# Patient Record
Sex: Male | Born: 1940
Health system: Southern US, Community
[De-identification: ages and names within clinical notes are randomized; demographics above are authoritative.]

## PROBLEM LIST (undated history)

## (undated) DIAGNOSIS — I878 Other specified disorders of veins: Secondary | ICD-10-CM

## (undated) DIAGNOSIS — H3530 Unspecified macular degeneration: Secondary | ICD-10-CM

## (undated) DIAGNOSIS — Z803 Family history of malignant neoplasm of breast: Secondary | ICD-10-CM

## (undated) DIAGNOSIS — F419 Anxiety disorder, unspecified: Secondary | ICD-10-CM

## (undated) DIAGNOSIS — G4730 Sleep apnea, unspecified: Secondary | ICD-10-CM

## (undated) DIAGNOSIS — E785 Hyperlipidemia, unspecified: Secondary | ICD-10-CM

## (undated) DIAGNOSIS — M5136 Other intervertebral disc degeneration, lumbar region: Secondary | ICD-10-CM

## (undated) DIAGNOSIS — M51369 Other intervertebral disc degeneration, lumbar region without mention of lumbar back pain or lower extremity pain: Secondary | ICD-10-CM

## (undated) DIAGNOSIS — G629 Polyneuropathy, unspecified: Secondary | ICD-10-CM

## (undated) DIAGNOSIS — F17200 Nicotine dependence, unspecified, uncomplicated: Secondary | ICD-10-CM

## (undated) HISTORY — DX: Polyneuropathy, unspecified: G62.9

## (undated) HISTORY — DX: Other intervertebral disc degeneration, lumbar region without mention of lumbar back pain or lower extremity pain: M51.369

## (undated) HISTORY — DX: Other specified disorders of veins: I87.8

## (undated) HISTORY — DX: Unspecified macular degeneration: H35.30

## (undated) HISTORY — DX: Other intervertebral disc degeneration, lumbar region: M51.36

## (undated) HISTORY — DX: Sleep apnea, unspecified: G47.30

## (undated) HISTORY — DX: Family history of malignant neoplasm of breast: Z80.3

## (undated) HISTORY — DX: Nicotine dependence, unspecified, uncomplicated: F17.200

## (undated) HISTORY — DX: Anxiety disorder, unspecified: F41.9

---

## 2003-08-08 ENCOUNTER — Encounter: Admission: RE | Admit: 2003-08-08 | Discharge: 2003-08-08 | Payer: Self-pay | Admitting: Family Medicine

## 2003-11-17 ENCOUNTER — Ambulatory Visit (HOSPITAL_COMMUNITY): Admission: RE | Admit: 2003-11-17 | Discharge: 2003-11-17 | Payer: Self-pay | Admitting: Gastroenterology

## 2003-11-17 ENCOUNTER — Encounter (INDEPENDENT_AMBULATORY_CARE_PROVIDER_SITE_OTHER): Payer: Self-pay | Admitting: *Deleted

## 2005-06-25 ENCOUNTER — Observation Stay (HOSPITAL_COMMUNITY): Admission: EM | Admit: 2005-06-25 | Discharge: 2005-06-26 | Payer: Self-pay | Admitting: Emergency Medicine

## 2006-06-02 ENCOUNTER — Ambulatory Visit (HOSPITAL_COMMUNITY): Admission: RE | Admit: 2006-06-02 | Discharge: 2006-06-02 | Payer: Self-pay | Admitting: Orthopedic Surgery

## 2007-09-08 ENCOUNTER — Emergency Department (HOSPITAL_COMMUNITY): Admission: EM | Admit: 2007-09-08 | Discharge: 2007-09-08 | Payer: Self-pay | Admitting: Emergency Medicine

## 2010-11-01 NOTE — Op Note (Signed)
NAME:  CHRISTIFER, CHAPDELAINE                         ACCOUNT NO.:  [id number]   MEDICAL RECORD NO.:  [id number]                   PATIENT TYPE:  AMB   LOCATION:  ENDO                                 FACILITY:  [facility]   PHYSICIAN:  Anselmo Rod, M.D.               DATE OF BIRTH:  12-22-1940   DATE OF PROCEDURE:  11/17/2003  DATE OF DISCHARGE:                                 OPERATIVE REPORT   PROCEDURE PERFORMED:  Colonoscopy with snare polypectomy x1.   ENDOSCOPIST:  Anselmo Rod, M.D.   INSTRUMENT USED:  Olympus video colonoscope.   INDICATION FOR PROCEDURE:  Guaiac-positive stools and chronic constipation  in a 70 year old white male.  Rule out colonic polyps, masses, etc.   PREPROCEDURE PREPARATION:  Informed consent was procured from the patient.  The patient had fasted for eight hours prior to the procedure and prepped  with a bottle of magnesium citrate and a gallon of GoLYTELY the night prior  to the procedure.   PREPROCEDURE PHYSICAL:  VITAL SIGNS:  The patient had stable vital signs.  NECK:  Supple.  CHEST:  Clear to auscultation.  S1, S2 regular.  ABDOMEN:  Soft with normal bowel sounds.   DESCRIPTION OF PROCEDURE:  The patient was placed in the left lateral  decubitus position and sedated with Demerol and Versed in slow incremental  doses.  Once the patient was adequately sedate and maintained on low-flow  oxygen and continuous cardiac monitoring, the Olympus video colonoscope was  advanced from the rectum to the cecum.  A flat polyp was snared from the  transverse colon close to the hepatic flexure.  Small internal and external  hemorrhoids were appreciated on retroflexion and anal inspection.  No other  abnormalities were noted.  There was no evidence of diverticulosis.  The  patient tolerated the procedure well without immediate complications.   IMPRESSION:  1. Small internal and external hemorrhoids.  2. A flat polyp snared from the transverse colon close to  hepatic flexure.  3. No evidence of diverticulosis.   RECOMMENDATIONS:  1. Await pathology results.  2. Avoid all nonsteroidals including aspirin for the next four weeks.  3. Repeat guaiac testing on an outpatient basis.  Further recommendations     will be made in follow-up.  4. Outpatient follow-up is advised in the next two weeks or earlier if need     be.                                               Anselmo Rod, M.D.    JNM/MEDQ  D:  11/17/2003  T:  11/18/2003  Job:  [id number]   cc:   Marjory Lies, M.D.  [address]  [place]  [place] [address]  Fax: [phone]

## 2010-11-01 NOTE — Discharge Summary (Signed)
NAME:  ROI, JAFARI NO.:  [id number]   MEDICAL RECORD NO.:  [id number]          PATIENT TYPE:  INP   LOCATION:  5733                         FACILITY:  [facility]   PHYSICIAN:  Michaelyn Barter, M.D. DATE OF BIRTH:  1941-03-24   DATE OF ADMISSION:  06/25/2005  DATE OF DISCHARGE:  06/26/2005                                 DISCHARGE SUMMARY   PRIMARY CARE PHYSICIAN:  Dr. Doristine Counter of [facility].   FINAL DIAGNOSIS:  Left lower extremity deep venous thrombosis.   HISTORY OF PRESENT ILLNESS:  Mr. Mortellaro is a 70 year old gentleman who  complained of sudden onset of swelling within his left lower extremity.  He  went to see his primary care physician for assessment.  He denied  experiencing any similar episodes of swelling previously.   PAST MEDICAL HISTORY:  For past medical history, please see the dictation  dictated by Dr. Lavera Guise on June 25, 2005.   HOSPITAL COURSE:  PROBLEM #1 - LEFT LOWER EXTREMITY DEEP VENOUS THROMBOSIS:  Following the patient's presentation to the emergency department, he was  started on Lovenox 90 mg subcu q.12 h.  In addition, Coumadin was initiated;  he initially received 5 mg p.o.  The patient has not complained of any pain  or other symptoms related to his lower extremity throughout  his  hospitalization.  There has been no complaint of shortness of breath or  chest pain.  As of today, June 26, 2005, the patient's INR is 1.1.  His  PT is 14.5.  Today, the patient states that he feels pretty good and he  requests to be discharged from the hospital.  On his vitals today, his temperature is 98.6, heart rate 85, respirations  20, blood pressure 107/68 and he is saturating 94% on room air.  The  decision has been made to discharge the patient from the hospital today.   DISCHARGE MEDICATIONS:  The patient's medications will consist of the  following:  1.  Coumadin -- the patient will be instructed to take 7.5 mg tonight  and      then 5 mg afterwards.  2.  Lovenox 90 mg subcu q.12 h.   FOLLOWUP:  The patient will be instructed to follow up with Dr. Doristine Counter  tomorrow for evaluation of his coagulations.      Michaelyn Barter, M.D.  Electronically Signed     OR/MEDQ  D:  06/26/2005  T:  06/27/2005  Job:  [id number]   cc:   Marjory Lies, M.D.  Fax: [phone]

## 2010-11-01 NOTE — H&P (Signed)
NAME:  Jonathon [name], Jon[facility] [name] NO.:  [id number]   MEDICAL RECORD NO.:  [id number]          [name] TYPE:  INP   LOCATION:  1845                         FACILITY:  [facility]   PHYSICIAN:  Lonia Blood, M.D.       DATE OF BIRTH:  [birth year]   DATE OF ADMISSION:  06/25/2005  DATE OF DISCHARGE:                                HISTORY & PHYSICAL   PRIMARY CARE PHYSICIAN:  Dr. Doristine Counter.   CHIEF COMPLAINT:  Left leg swelling.   HISTORY OF PRESENT ILLNESS:  Jonathon [name] is a 70 year old man with history  of tobacco abuse, previously healthy, who experienced sudden onset of  swelling left leg today. He presented to his primary care physician and was  sent for lower extremity Doppler. A DVt was diagnosed and [name] was  sent to [facility]. He reports that three days prior to admission he had a long  drive from [place] to [place] without interruption . [facility]athon [name]  never had any previous similar episodes. He has never had any surgeries nor  recent traumatic events.   MEDICATIONS:  Xanax p.r.n.   FAMILY HISTORY:  Mother had varicose veins .   SOCIAL HISTORY:  [facility] [name] smokes one pack of cigarettes per day. Drinks  occasionally.  Works as a Nutritional therapist .  He is married and lives with his wife   ALLERGIES:  Penicillin and codeine.   REVIEW OF SYSTEMS:  Negative for chest pain, coughing, nausea. Negative for  hematochezia. Negative for dyspnea. All other system as per HPI.   PHYSICAL EXAMINATION:  Upon admission showed temperature 97.8, pulse 67,  respirations 16, blood pressure 127/83 .  Heart rate 86.  Saturation 94% on  room air.  GENERAL APPEARANCE:  [facility] [name] is a well-developed, well-nourished in no  acute distress. Alert and oriented to place, person and time.  CHEST:  Clear to auscultation bilaterally. No wheezes and no crackles.  NECK:  Supple. No JVD or carotid bruits.  HEART:  Regular rate and rhythm.  ABDOMEN:  Soft, nontender, and nondistended. Bowel sounds are  present.  EXTREMITIES:  [facility] left lower extremity has +2 edema, nonpitting.  SKIN:  Warm and dry.  NEUROLOGICAL: CN 3-12 inatct, strength 5/5 all 4 extremitties, sensation is  intact.   Sodium 139, potassium 3.9, chloride 104, ECG - NSR , incomplete RBBB.   IMPRESSION:  1.  Left lower extremity deep vein thrombosis most likely secondary to      prolonged immobilization . I do not suspect a malignancy. Jon[facility] [name]      had a colonoscopy in 2005 which was within normal limits. For      completeness will check a chest x-ray and a urinalysis, since Jonathon      [name] is a tobacco user and has family      history of genito-urinary malignancy.  2.  Tobacco abuse:  We will educate Jonathon [name] on smoking cessation. Start      a nicotine patch.  3.  Gastrointestinal prophylaxis using Protonix.      Lonia Blood, M.D.  Electronically Signed  SL/MEDQ  D:  06/25/2005  T:  06/26/2005  Job:  [id number]

## 2012-09-29 ENCOUNTER — Other Ambulatory Visit (HOSPITAL_COMMUNITY): Payer: Self-pay | Admitting: Family Medicine

## 2012-09-29 ENCOUNTER — Ambulatory Visit (HOSPITAL_COMMUNITY)
Admission: RE | Admit: 2012-09-29 | Discharge: 2012-09-29 | Disposition: A | Discharge status: Home / Self Care | Payer: Medicare Other | Source: Ambulatory Visit | Attending: Family Medicine | Admitting: Family Medicine

## 2012-09-29 DIAGNOSIS — M79609 Pain in unspecified limb: Secondary | ICD-10-CM

## 2012-09-29 NOTE — Progress Notes (Signed)
Arterial Duplex Right Lower Ext. Completed. Preliminary report by tech.  Normal ABI and Duplex Study. Chauncy Lean

## 2015-06-20 DIAGNOSIS — L97222 Non-pressure chronic ulcer of left calf with fat layer exposed: Secondary | ICD-10-CM | POA: Diagnosis not present

## 2015-06-27 DIAGNOSIS — I872 Venous insufficiency (chronic) (peripheral): Secondary | ICD-10-CM | POA: Diagnosis not present

## 2015-06-27 DIAGNOSIS — L97222 Non-pressure chronic ulcer of left calf with fat layer exposed: Secondary | ICD-10-CM | POA: Diagnosis not present

## 2015-07-04 DIAGNOSIS — Z87891 Personal history of nicotine dependence: Secondary | ICD-10-CM | POA: Diagnosis not present

## 2015-07-04 DIAGNOSIS — I872 Venous insufficiency (chronic) (peripheral): Secondary | ICD-10-CM | POA: Diagnosis not present

## 2015-07-04 DIAGNOSIS — L97222 Non-pressure chronic ulcer of left calf with fat layer exposed: Secondary | ICD-10-CM | POA: Diagnosis not present

## 2015-07-11 DIAGNOSIS — Z87891 Personal history of nicotine dependence: Secondary | ICD-10-CM | POA: Diagnosis not present

## 2015-07-11 DIAGNOSIS — L97222 Non-pressure chronic ulcer of left calf with fat layer exposed: Secondary | ICD-10-CM | POA: Diagnosis not present

## 2015-07-11 DIAGNOSIS — I872 Venous insufficiency (chronic) (peripheral): Secondary | ICD-10-CM | POA: Diagnosis not present

## 2015-07-16 DIAGNOSIS — L98491 Non-pressure chronic ulcer of skin of other sites limited to breakdown of skin: Secondary | ICD-10-CM | POA: Diagnosis not present

## 2015-07-16 DIAGNOSIS — G629 Polyneuropathy, unspecified: Secondary | ICD-10-CM | POA: Diagnosis not present

## 2015-07-16 DIAGNOSIS — F419 Anxiety disorder, unspecified: Secondary | ICD-10-CM | POA: Diagnosis not present

## 2015-07-18 DIAGNOSIS — L97222 Non-pressure chronic ulcer of left calf with fat layer exposed: Secondary | ICD-10-CM | POA: Diagnosis not present

## 2015-07-18 DIAGNOSIS — Z87891 Personal history of nicotine dependence: Secondary | ICD-10-CM | POA: Diagnosis not present

## 2015-07-18 DIAGNOSIS — I872 Venous insufficiency (chronic) (peripheral): Secondary | ICD-10-CM | POA: Diagnosis not present

## 2015-07-23 DIAGNOSIS — H35031 Hypertensive retinopathy, right eye: Secondary | ICD-10-CM | POA: Diagnosis not present

## 2015-07-23 DIAGNOSIS — H35373 Puckering of macula, bilateral: Secondary | ICD-10-CM | POA: Diagnosis not present

## 2015-07-23 DIAGNOSIS — H2513 Age-related nuclear cataract, bilateral: Secondary | ICD-10-CM | POA: Diagnosis not present

## 2015-07-23 DIAGNOSIS — H35033 Hypertensive retinopathy, bilateral: Secondary | ICD-10-CM | POA: Diagnosis not present

## 2015-07-23 DIAGNOSIS — H35032 Hypertensive retinopathy, left eye: Secondary | ICD-10-CM | POA: Diagnosis not present

## 2015-07-23 DIAGNOSIS — H25013 Cortical age-related cataract, bilateral: Secondary | ICD-10-CM | POA: Diagnosis not present

## 2015-07-25 DIAGNOSIS — L97222 Non-pressure chronic ulcer of left calf with fat layer exposed: Secondary | ICD-10-CM | POA: Diagnosis not present

## 2015-07-25 DIAGNOSIS — I872 Venous insufficiency (chronic) (peripheral): Secondary | ICD-10-CM | POA: Diagnosis not present

## 2015-07-25 DIAGNOSIS — Z87891 Personal history of nicotine dependence: Secondary | ICD-10-CM | POA: Diagnosis not present

## 2015-08-01 DIAGNOSIS — L97222 Non-pressure chronic ulcer of left calf with fat layer exposed: Secondary | ICD-10-CM | POA: Diagnosis not present

## 2015-08-01 DIAGNOSIS — Z87891 Personal history of nicotine dependence: Secondary | ICD-10-CM | POA: Diagnosis not present

## 2015-08-01 DIAGNOSIS — I872 Venous insufficiency (chronic) (peripheral): Secondary | ICD-10-CM | POA: Diagnosis not present

## 2015-11-07 DIAGNOSIS — M545 Low back pain: Secondary | ICD-10-CM | POA: Diagnosis not present

## 2015-12-20 DIAGNOSIS — I83009 Varicose veins of unspecified lower extremity with ulcer of unspecified site: Secondary | ICD-10-CM | POA: Diagnosis not present

## 2015-12-20 DIAGNOSIS — G609 Hereditary and idiopathic neuropathy, unspecified: Secondary | ICD-10-CM | POA: Diagnosis not present

## 2015-12-20 DIAGNOSIS — L97909 Non-pressure chronic ulcer of unspecified part of unspecified lower leg with unspecified severity: Secondary | ICD-10-CM | POA: Diagnosis not present

## 2016-01-14 DIAGNOSIS — G609 Hereditary and idiopathic neuropathy, unspecified: Secondary | ICD-10-CM | POA: Diagnosis not present

## 2016-01-14 DIAGNOSIS — M150 Primary generalized (osteo)arthritis: Secondary | ICD-10-CM | POA: Diagnosis not present

## 2016-01-14 DIAGNOSIS — F419 Anxiety disorder, unspecified: Secondary | ICD-10-CM | POA: Diagnosis not present

## 2016-02-25 ENCOUNTER — Telehealth: Payer: Self-pay | Admitting: Family Medicine

## 2016-02-25 NOTE — Telephone Encounter (Signed)
This patient wants to establish care with Dr. Caryl NeverBurchette. He said that he seen him at the [facility]. I explained to him that Dr. Caryl NeverBurchette isn't taking new patients but I can stil send a message and one of the schedulers will call him to make an appointment to see Dr. Caryl NeverBurchette if he accepts him back or to scehdule him with another provider. He said that he only wants to be established with Dr. Caryl NeverBurchette.

## 2016-02-25 NOTE — Telephone Encounter (Signed)
Unable to take anymore New Patients. Please have him schedule with someone else. Thanks.

## 2016-02-25 NOTE — Telephone Encounter (Signed)
Unfortunately, I cannot take on any new patients at this time.

## 2016-03-18 DIAGNOSIS — Z23 Encounter for immunization: Secondary | ICD-10-CM | POA: Diagnosis not present

## 2016-04-16 NOTE — Telephone Encounter (Signed)
Pt has scheduled appt with Dr Durene CalHunter

## 2016-05-28 ENCOUNTER — Ambulatory Visit (HOSPITAL_COMMUNITY): Payer: Medicare Other

## 2016-05-28 ENCOUNTER — Ambulatory Visit (HOSPITAL_COMMUNITY)
Admission: EM | Admit: 2016-05-28 | Discharge: 2016-05-28 | Disposition: A | Discharge status: Home / Self Care | Payer: Medicare Other | Attending: Emergency Medicine | Admitting: Emergency Medicine

## 2016-05-28 ENCOUNTER — Emergency Department (HOSPITAL_COMMUNITY): Payer: Medicare Other | Admitting: Certified Registered"

## 2016-05-28 ENCOUNTER — Encounter (HOSPITAL_COMMUNITY): Payer: Self-pay

## 2016-05-28 ENCOUNTER — Encounter (HOSPITAL_COMMUNITY)
Admission: EM | Disposition: A | Discharge status: Home / Self Care | Payer: Self-pay | Source: Home / Self Care | Attending: Emergency Medicine

## 2016-05-28 DIAGNOSIS — X58XXXA Exposure to other specified factors, initial encounter: Secondary | ICD-10-CM | POA: Insufficient documentation

## 2016-05-28 DIAGNOSIS — K222 Esophageal obstruction: Secondary | ICD-10-CM

## 2016-05-28 DIAGNOSIS — R918 Other nonspecific abnormal finding of lung field: Secondary | ICD-10-CM | POA: Diagnosis not present

## 2016-05-28 DIAGNOSIS — Z79899 Other long term (current) drug therapy: Secondary | ICD-10-CM | POA: Insufficient documentation

## 2016-05-28 DIAGNOSIS — T18128A Food in esophagus causing other injury, initial encounter: Secondary | ICD-10-CM | POA: Insufficient documentation

## 2016-05-28 DIAGNOSIS — F17210 Nicotine dependence, cigarettes, uncomplicated: Secondary | ICD-10-CM | POA: Insufficient documentation

## 2016-05-28 DIAGNOSIS — Z791 Long term (current) use of non-steroidal anti-inflammatories (NSAID): Secondary | ICD-10-CM | POA: Diagnosis not present

## 2016-05-28 DIAGNOSIS — N289 Disorder of kidney and ureter, unspecified: Secondary | ICD-10-CM | POA: Diagnosis not present

## 2016-05-28 DIAGNOSIS — K210 Gastro-esophageal reflux disease with esophagitis: Secondary | ICD-10-CM | POA: Diagnosis not present

## 2016-05-28 DIAGNOSIS — K219 Gastro-esophageal reflux disease without esophagitis: Secondary | ICD-10-CM | POA: Diagnosis not present

## 2016-05-28 DIAGNOSIS — R1310 Dysphagia, unspecified: Secondary | ICD-10-CM | POA: Diagnosis not present

## 2016-05-28 HISTORY — PX: ESOPHAGOGASTRODUODENOSCOPY: SHX5428

## 2016-05-28 LAB — CBC WITH DIFFERENTIAL/PLATELET
Basophils Absolute: 0 10*3/uL (ref 0.0–0.1)
Basophils Relative: 1 %
EOS ABS: 0.4 10*3/uL (ref 0.0–0.7)
EOS PCT: 8 %
HCT: 42.2 % (ref 39.0–52.0)
HEMOGLOBIN: 15 g/dL (ref 13.0–17.0)
LYMPHS ABS: 1.2 10*3/uL (ref 0.7–4.0)
LYMPHS PCT: 26 %
MCH: 32.5 pg (ref 26.0–34.0)
MCHC: 35.5 g/dL (ref 30.0–36.0)
MCV: 91.3 fL (ref 78.0–100.0)
MONOS PCT: 11 %
Monocytes Absolute: 0.5 10*3/uL (ref 0.1–1.0)
NEUTROS PCT: 54 %
Neutro Abs: 2.5 10*3/uL (ref 1.7–7.7)
Platelets: 242 10*3/uL (ref 150–400)
RBC: 4.62 MIL/uL (ref 4.22–5.81)
RDW: 13 % (ref 11.5–15.5)
WBC: 4.6 10*3/uL (ref 4.0–10.5)

## 2016-05-28 LAB — I-STAT CHEM 8, ED
BUN: 15 mg/dL (ref 6–20)
CALCIUM ION: 1.2 mmol/L (ref 1.15–1.40)
CHLORIDE: 102 mmol/L (ref 101–111)
Creatinine, Ser: 1.4 mg/dL — ABNORMAL HIGH (ref 0.61–1.24)
GLUCOSE: 80 mg/dL (ref 65–99)
HCT: 44 % (ref 39.0–52.0)
Hemoglobin: 15 g/dL (ref 13.0–17.0)
Potassium: 4 mmol/L (ref 3.5–5.1)
Sodium: 140 mmol/L (ref 135–145)
TCO2: 29 mmol/L (ref 0–100)

## 2016-05-28 SURGERY — EGD (ESOPHAGOGASTRODUODENOSCOPY)
Anesthesia: Moderate Sedation | Laterality: Left

## 2016-05-28 SURGERY — EGD (ESOPHAGOGASTRODUODENOSCOPY)
Anesthesia: General

## 2016-05-28 MED ORDER — SUCCINYLCHOLINE CHLORIDE 200 MG/10ML IV SOSY
PREFILLED_SYRINGE | INTRAVENOUS | Status: DC | PRN
  Administered 2016-05-28: 120 mg via INTRAVENOUS

## 2016-05-28 MED ORDER — ONDANSETRON HCL 4 MG/2ML IJ SOLN
INTRAMUSCULAR | Status: DC | PRN
  Administered 2016-05-28: 4 mg via INTRAVENOUS

## 2016-05-28 MED ORDER — DIPHENHYDRAMINE HCL 50 MG/ML IJ SOLN
INTRAMUSCULAR | Status: AC
  Filled 2016-05-28: qty 1

## 2016-05-28 MED ORDER — MIDAZOLAM HCL 10 MG/2ML IJ SOLN
INTRAMUSCULAR | Status: DC | PRN
  Administered 2016-05-28 (×2): 2 mg via INTRAVENOUS

## 2016-05-28 MED ORDER — DIPHENHYDRAMINE HCL 50 MG/ML IJ SOLN
INTRAMUSCULAR | Status: DC | PRN
  Administered 2016-05-28: 25 mg via INTRAVENOUS

## 2016-05-28 MED ORDER — FENTANYL CITRATE (PF) 100 MCG/2ML IJ SOLN
INTRAMUSCULAR | Status: DC | PRN
  Administered 2016-05-28 (×3): 25 ug via INTRAVENOUS

## 2016-05-28 MED ORDER — FENTANYL CITRATE (PF) 100 MCG/2ML IJ SOLN
INTRAMUSCULAR | Status: AC
  Filled 2016-05-28: qty 2

## 2016-05-28 MED ORDER — GLUCAGON HCL RDNA (DIAGNOSTIC) 1 MG IJ SOLR
1.0000 mg | Freq: Once | INTRAMUSCULAR | Status: AC
  Administered 2016-05-28: 1 mg via INTRAVENOUS
  Filled 2016-05-28: qty 1

## 2016-05-28 MED ORDER — PROPOFOL 10 MG/ML IV BOLUS
INTRAVENOUS | Status: DC | PRN
  Administered 2016-05-28: 150 mg via INTRAVENOUS

## 2016-05-28 MED ORDER — SUCCINYLCHOLINE CHLORIDE 200 MG/10ML IV SOSY
PREFILLED_SYRINGE | INTRAVENOUS | Status: AC
  Filled 2016-05-28: qty 10

## 2016-05-28 MED ORDER — FENTANYL CITRATE (PF) 100 MCG/2ML IJ SOLN
INTRAMUSCULAR | Status: DC | PRN
  Administered 2016-05-28 (×2): 50 ug via INTRAVENOUS

## 2016-05-28 MED ORDER — SODIUM CHLORIDE 0.9 % IV SOLN
INTRAVENOUS | Status: DC
  Administered 2016-05-28: 500 mL via INTRAVENOUS

## 2016-05-28 MED ORDER — FENTANYL CITRATE (PF) 100 MCG/2ML IJ SOLN
INTRAMUSCULAR | Status: AC
  Filled 2016-05-28: qty 4

## 2016-05-28 MED ORDER — LACTATED RINGERS IV SOLN
INTRAVENOUS | Status: DC | PRN
Start: 2016-05-28 — End: 2016-05-28
  Administered 2016-05-28: 20:00:00 via INTRAVENOUS

## 2016-05-28 MED ORDER — PROPOFOL 10 MG/ML IV BOLUS
INTRAVENOUS | Status: AC
  Filled 2016-05-28: qty 20

## 2016-05-28 MED ORDER — LIDOCAINE 2% (20 MG/ML) 5 ML SYRINGE
INTRAMUSCULAR | Status: DC | PRN
  Administered 2016-05-28: 20 mg via INTRAVENOUS

## 2016-05-28 MED ORDER — ONDANSETRON HCL 4 MG/2ML IJ SOLN
INTRAMUSCULAR | Status: AC
Start: 2016-05-28 — End: 2016-05-28
  Filled 2016-05-28: qty 2

## 2016-05-28 MED ORDER — MIDAZOLAM HCL 5 MG/ML IJ SOLN
INTRAMUSCULAR | Status: AC
  Filled 2016-05-28: qty 2

## 2016-05-28 NOTE — Consult Note (Signed)
Reason for Consult: Food impaction. Referring Physician: ER MD-Dr. Ethelda ChickJacubowitz.  [facility] [name] is an 75 y.o. male.  HPI: 75 year old white male here fo a food impaction that occurred about 2 PM this afternoon while he was eating some bread and country barbeque. He has been taking 2 BC powders and Meloxicam for joint pains. He is otherwise healthy. He claims he has been having dysphagia for solids off an on for 15 years but has never called my office with this problem.  History reviewed. No pertinent past medical history.  History reviewed. No pertinent surgical history.  History reviewed. No pertinent family history.  Social History:  reports that he has been smoking Cigarettes.  He has never used smokeless tobacco. He reports that he drinks alcohol. He reports that he does not use drugs.  Allergies:  Allergies  Allergen Reactions  . Penicillins Rash    Has patient had a PCN reaction causing immediate rash, facial/tongue/throat swelling, SOB or lightheadedness with hypotension: no Has patient had a PCN reaction causing severe rash involving mucus membranes or skin necrosis:yes Has patient had a PCN reaction that required hospitalization: no Has patient had a PCN reaction occurring within the last 10 years: no If all of the above answers are "NO", then may proceed with Cephalosporin use.    Medications: I have reviewed the patient's current medications.  Results for orders placed or performed during the hospital encounter of 05/28/16 (from the past 48 hour(s))  CBC with Differential/Platelet     Status: None   Collection Time: 05/28/16  4:46 PM  Result Value Ref Range   WBC 4.6 4.0 - 10.5 K/uL   RBC 4.62 4.22 - 5.81 MIL/uL   Hemoglobin 15.0 13.0 - 17.0 g/dL   HCT 16.142.2 09.639.0 - 04.552.0 %   MCV 91.3 78.0 - 100.0 fL   MCH 32.5 26.0 - 34.0 pg   MCHC 35.5 30.0 - 36.0 g/dL   RDW 40.913.0 81.111.5 - 91.415.5 %   Platelets 242 150 - 400 K/uL   Neutrophils Relative % 54 %   Neutro Abs 2.5 1.7 - 7.7  K/uL   Lymphocytes Relative 26 %   Lymphs Abs 1.2 0.7 - 4.0 K/uL   Monocytes Relative 11 %   Monocytes Absolute 0.5 0.1 - 1.0 K/uL   Eosinophils Relative 8 %   Eosinophils Absolute 0.4 0.0 - 0.7 K/uL   Basophils Relative 1 %   Basophils Absolute 0.0 0.0 - 0.1 K/uL  I-stat chem 8, ed     Status: Abnormal   Collection Time: 05/28/16  4:57 PM  Result Value Ref Range   Sodium 140 135 - 145 mmol/L   Potassium 4.0 3.5 - 5.1 mmol/L   Chloride 102 101 - 111 mmol/L   BUN 15 6 - 20 mg/dL   Creatinine, Ser 7.821.40 (H) 0.61 - 1.24 mg/dL   Glucose, Bld 80 65 - 99 mg/dL   Calcium, Ion 9.561.20 1.15 - 1.40 mmol/L   TCO2 29 0 - 100 mmol/L   Hemoglobin 15.0 13.0 - 17.0 g/dL   HCT 21.344.0 08.639.0 - 57.852.0 %    No results found.  Review of Systems  Constitutional: Negative.   HENT: Negative.   Eyes: Negative.   Respiratory: Negative.   Cardiovascular: Negative.   Gastrointestinal: Positive for heartburn. Negative for abdominal pain, blood in stool, constipation, diarrhea, melena, nausea and vomiting.  Musculoskeletal: Positive for joint pain.  Skin: Negative.   Neurological: Negative.   Endo/Heme/Allergies: Negative.  Psychiatric/Behavioral: Negative.    Blood pressure 144/95, pulse 70, temperature 97.7 F (36.5 C), temperature source Oral, resp. rate 16, height 5\' 10"  (1.778 m), weight 94.8 kg (209 lb), SpO2 96 %. Physical Exam  Constitutional: He is oriented to person, place, and time. He appears well-developed and well-nourished.  HENT:  Head: Normocephalic and atraumatic.  Eyes: Conjunctivae and EOM are normal. Pupils are equal, round, and reactive to light.  Neck: Normal range of motion. Neck supple.  Cardiovascular: Normal rate and regular rhythm.   GI: Soft. Bowel sounds are normal.  Musculoskeletal: Normal range of motion.  Neurological: He is alert and oriented to person, place, and time.  Skin: Skin is warm and dry.  Psychiatric: He has a normal mood and affect. His behavior is normal.  Judgment and thought content normal.   Assessment/Plan: Solid food dysphagia with a food impaction-will proceed with an EGD now.  Jonathon [name] 05/28/2016, 6:51 PM

## 2016-05-28 NOTE — ED Notes (Signed)
Endoscopy at bedside. 

## 2016-05-28 NOTE — Op Note (Addendum)
[facility] Patient Name: Jonathon [name] Procedure Date: 05/28/2016 MRN: [id number] Attending MD: Charna Elizabeth , MD Date of Birth: November 29, 1940 CSN: [id number] Age: 75 Admit Type: Inpatient Procedure:                EGD with removal of meat impaction from proximal                            esophagus. Indications:              Dysphagia, Food-meat impaction; Gastro-esophageal                            reflux disease Providers:                Charna Elizabeth, MD, Dwain Sarna, RN, Beryle Beams,                            technician, Early Osmond, CRNA Referring MD:             Delorse Lek, MD Medicines:                Monitored anesthesia care was used after the                            patient was transferred to the OR. Complications:            No immediate complications. Estimated Blood Loss:     Estimated blood loss: none. Procedure:                Pre-anesthesia assessment: - Prior to the                            procedure, a history and physical was performed,                            and patient medications and allergies were                            reviewed. The patient's tolerance of previous                            anesthesia was also reviewed. The risks and                            benefits of the procedure and the sedation options                            and risks were discussed with the patient. All                            questions were answered, and informed consent was                            obtained. Prior anticoagulants: The patient has  taken previous NSAID medication, last dose was day                            of procedure. ASA Grade assessment: II - A patient                            with mild systemic disease. After reviewing the                            risks and benefits, the patient was deemed in                            satisfactory condition to undergo the procedure.                             After obtaining informed consent, the endoscope was                            passed under direct vision. Throughout the                            procedure, the patient's blood pressure, pulse, and                            oxygen saturations were monitored continuously. The                            endoscope was introduced through the mouth, and                            advanced to the antrum of the stomach. The EGD was                            extremely difficult due to the meat bolus impacted                            in the proximal esophagus. The patient tolerated                            the procedure well. Scope In: Scope Out: Findings:      A large bolus of meat was noted impacted at the proximal esophagus-an       attempt was made to gently "push' it further down with the tip of the       scope but i was unable to do so. I aborted the procedure and moved the       patient to the OR and repeated the procedure under MAC after he was       intubated by CRNA. Removal of food was accomplished by using a Talon       grasper and breaking up the meat bolus slowly and moving it further down       into the esophagus. There was some esophagitis noted in the proximal       esophagus where the meat bolus had been  impacted. I was not able to       appreciate a Zenker's diverticulum there. The rest of the esophagus       appeared patent and normal.      The examined stomach appeared normal.      The proximal small bowel was noted visualized. Impression:               - Large meat bolus in the proximal esophagus-broken                            up piecemeal and removed by a Talon grasper-food                            gently pushed into the stomach.                           - Normal appearing stomach. Moderate Sedation:      Moderate (conscious) sedation was administered by the endoscopy nurse       and supervised by the endoscopist when the patient was in the ER. The        following parameters were monitored: oxygen saturation, heart rate,       blood pressure, respiratory rate, EKG, adequacy of pulmonary       ventilation, and response to care. Total physician intraservice time was       5 minutes. The intraprocedural time in the OR was 15 minutes. Recommendation:           - Clear liquid diet for now.                           - Continue present medications EXCEPT FOR MELOXICAM                            AND BC POWDERS.                           - No Aspirin, Ibuprofen, naproxen, or other                            non-steroidal anti-inflammatory drugs.                           - Return to my office tomorrow for an office visit. Procedure Code(s):        --- Professional ---                           [id number], Esophagogastroduodenoscopy, flexible,                            transoral; with removal of foreign body(s) Diagnosis Code(s):        --- Professional ---                           R13.10, Dysphagia, unspecified                           K21.9, Gastro-esophageal reflux disease without  esophagitis                           T18.128A, Food in esophagus causing other injury,                            initial encounter CPT copyright 2016 American Medical Association. All rights reserved. The codes documented in this report are preliminary and upon coder review may  be revised to meet current compliance requirements. Charna ElizabethJyothi Omolara Carol, MD Charna ElizabethJyothi Brendaly Townsel, MD 05/28/2016 9:00:47 PM This report has been signed electronically. Number of Addenda: 0

## 2016-05-28 NOTE — Anesthesia Preprocedure Evaluation (Signed)
Anesthesia Evaluation  Patient identified by MRN, date of birth, ID band Patient awake    Reviewed: Allergy & Precautions, NPO status , Patient's Chart, lab work & pertinent test results  Airway Mallampati: II  TM Distance: >3 FB Neck ROM: Full    Dental  (+) Dental Advisory Given   Pulmonary Current Smoker,    breath sounds clear to auscultation       Cardiovascular negative cardio ROS   Rhythm:Regular Rate:Normal     Neuro/Psych negative neurological ROS     GI/Hepatic Neg liver ROS, Food impaction   Endo/Other  negative endocrine ROS  Renal/GU negative Renal ROS     Musculoskeletal  (+) Arthritis ,   Abdominal   Peds  Hematology negative hematology ROS (+)   Anesthesia Other Findings   Reproductive/Obstetrics                             Anesthesia Physical Anesthesia Plan  ASA: II and emergent  Anesthesia Plan: General   Post-op Pain Management:    Induction: Intravenous, Rapid sequence and Cricoid pressure planned  Airway Management Planned: Oral ETT  Additional Equipment:   Intra-op Plan:   Post-operative Plan: Extubation in OR  Informed Consent: I have reviewed the patients History and Physical, chart, labs and discussed the procedure including the risks, benefits and alternatives for the proposed anesthesia with the patient or authorized representative who has indicated his/her understanding and acceptance.   Dental advisory given  Plan Discussed with: CRNA  Anesthesia Plan Comments:         Anesthesia Quick Evaluation

## 2016-05-28 NOTE — ED Triage Notes (Signed)
PT STS HE WAS EATING BARBEQUED PORK AND BREAD, AND HE IS UNABLE TO SWALLOW IT DOWN FOR THE PAST 30 MINUTES. PT STS HE CAN NOT SWALLOW. DENIES NAUSEA, VOMITING, OR SOB.

## 2016-05-28 NOTE — Anesthesia Postprocedure Evaluation (Signed)
Anesthesia Post Note  Patient: Jonathon RuedJack C [name]  Procedure(s) Performed: Procedure(s) (LRB): ESOPHAGOGASTRODUODENOSCOPY (EGD) WITH FOREIGN BODY REMOVAL (N/A)  Patient location during evaluation: PACU Anesthesia Type: General Level of consciousness: awake and alert Pain management: pain level controlled Vital Signs Assessment: post-procedure vital signs reviewed and stable Respiratory status: spontaneous breathing, nonlabored ventilation, respiratory function stable and patient connected to nasal cannula oxygen Cardiovascular status: blood pressure returned to baseline and stable Postop Assessment: no signs of nausea or vomiting Anesthetic complications: no    Last Vitals:  Vitals:   05/28/16 2039 05/28/16 2045  BP: 115/78 122/75  Pulse: 101 90  Resp: 22 14  Temp: 36.4 C     Last Pain:  Vitals:   05/28/16 2039  TempSrc:   PainSc: 0-No pain                 Kennieth RadFitzgerald, Deane Melick E

## 2016-05-28 NOTE — ED Provider Notes (Signed)
[facility]-EMERGENCY DEPT Provider Note   CSN: [id number] Arrival date & time: 05/28/16  1502     History   Chief Complaint Chief Complaint  Patient presents with  . Swallowed Foreign Body    HPI Maryan RuedJack C Denz is a 75 y.o. male.Patient was eating country barbecue 2 PM today when food got stuck while swallowing. He points to his mid chest. He has trouble swelling secretions since the event. He denies any dyspnea. No treatment prior to coming here he's had similar episodes in the past and food has passed spontaneously after waiting. Nothing makes symptoms better or worse no other associated symptoms  HPI  History reviewed. No pertinent past medical history. Past medical history arthritis There are no active problems to display for this patient.   History reviewed. No pertinent surgical history.     Home Medications    Prior to Admission medications   Medication Sig Start Date End Date Taking? Authorizing Provider  ALPRAZolam Prudy Feeler(XANAX) 0.5 MG tablet Take 0.5 mg by mouth 2 (two) times daily. 04/28/16  Yes Historical Provider, MD  Aspirin-Salicylamide-Caffeine (BC HEADACHE POWDER PO) Take 1 packet by mouth daily as needed (headache).   Yes Historical Provider, MD  gabapentin (NEURONTIN) 400 MG capsule Take 400 mg by mouth 3 (three) times daily. 05/09/16  Yes Historical Provider, MD  meloxicam (MOBIC) 15 MG tablet Take 15 mg by mouth daily. 04/27/16  Yes Historical Provider, MD  Omega-3 Fatty Acids (FISH OIL PO) Take 2 capsules by mouth 2 (two) times daily.   Yes Historical Provider, MD    Family History History reviewed. No pertinent family history.  Social History Social History  Substance Use Topics  . Smoking status: Current Every Day Smoker    Types: Cigarettes  . Smokeless tobacco: Never Used  . Alcohol use Yes     Comment: SOCIALLY     Allergies   Penicillins   Review of Systems Review of Systems  Constitutional: Negative.   HENT: Positive for trouble  swallowing.   Respiratory: Negative.   Cardiovascular: Negative.   Gastrointestinal: Negative.   Musculoskeletal: Negative.   Skin: Negative.   Neurological: Negative.   Psychiatric/Behavioral: Negative.   All other systems reviewed and are negative.    Physical Exam Updated Vital Signs BP 161/88   Pulse 79   Temp 97.7 F (36.5 C) (Oral)   Resp 16   Ht 5\' 10"  (1.778 m)   Wt 209 lb (94.8 kg)   SpO2 98%   BMI 29.99 kg/m   Physical Exam  Constitutional: He appears well-developed and well-nourished.  HENT:  Head: Normocephalic and atraumatic.  Eyes: Conjunctivae are normal. Pupils are equal, round, and reactive to light.  Neck: Neck supple. No tracheal deviation present. No thyromegaly present.  Cardiovascular: Normal rate and regular rhythm.   No murmur heard. Pulmonary/Chest: Effort normal and breath sounds normal.  Abdominal: Soft. Bowel sounds are normal. He exhibits no distension. There is no tenderness.  Musculoskeletal: Normal range of motion. He exhibits no edema or tenderness.  Neurological: He is alert. Coordination normal.  Skin: Skin is warm and dry. No rash noted.  Psychiatric: He has a normal mood and affect.  Nursing note and vitals reviewed.    ED Treatments / Results  Labs (all labs ordered are listed, but only abnormal results are displayed) Labs Reviewed  CBC WITH DIFFERENTIAL/PLATELET  I-STAT CHEM 8, ED    EKG  EKG Interpretation None       Radiology No results found.  Procedures Procedures (including critical care time)  Medications Ordered in ED Medications  glucagon (human recombinant) (GLUCAGEN) injection 1 mg (not administered)    Results for orders placed or performed during the hospital encounter of 05/28/16  CBC with Differential/Platelet  Result Value Ref Range   WBC 4.6 4.0 - 10.5 K/uL   RBC 4.62 4.22 - 5.81 MIL/uL   Hemoglobin 15.0 13.0 - 17.0 g/dL   HCT 16.142.2 09.639.0 - 04.552.0 %   MCV 91.3 78.0 - 100.0 fL   MCH 32.5  26.0 - 34.0 pg   MCHC 35.5 30.0 - 36.0 g/dL   RDW 40.913.0 81.111.5 - 91.415.5 %   Platelets 242 150 - 400 K/uL   Neutrophils Relative % 54 %   Neutro Abs 2.5 1.7 - 7.7 K/uL   Lymphocytes Relative 26 %   Lymphs Abs 1.2 0.7 - 4.0 K/uL   Monocytes Relative 11 %   Monocytes Absolute 0.5 0.1 - 1.0 K/uL   Eosinophils Relative 8 %   Eosinophils Absolute 0.4 0.0 - 0.7 K/uL   Basophils Relative 1 %   Basophils Absolute 0.0 0.0 - 0.1 K/uL  I-stat chem 8, ed  Result Value Ref Range   Sodium 140 135 - 145 mmol/L   Potassium 4.0 3.5 - 5.1 mmol/L   Chloride 102 101 - 111 mmol/L   BUN 15 6 - 20 mg/dL   Creatinine, Ser 7.821.40 (H) 0.61 - 1.24 mg/dL   Glucose, Bld 80 65 - 99 mg/dL   Calcium, Ion 9.561.20 1.15 - 1.40 mmol/L   TCO2 29 0 - 100 mmol/L   Hemoglobin 15.0 13.0 - 17.0 g/dL   HCT 21.344.0 08.639.0 - 57.852.0 %   No results found. Initial Impression / Assessment and Plan / ED Course  I have reviewed the triage vital signs and the nursing notes.  Pertinent labs & imaging results that were available during my care of the patient were reviewed by me and considered in my medical decision making (see chart for details).  Clinical Course     5:30 PM symptoms not improved after treatment with intravenous glucagon. I have consulted Dr.Mann will come to ED to evaluate patient for endoscopy Patient was taken to the operating room by Dr.Mann Final Clinical Impressions(s) / ED Diagnoses  Diagnosis #1 esophageal food impaction #2 renal insufficiency Final diagnoses:  None    New Prescriptions New Prescriptions   No medications on file     Doug SouSam Quandarius Nill, MD 05/28/16 1954

## 2016-05-28 NOTE — Transfer of Care (Signed)
Immediate Anesthesia Transfer of Care Note  Patient: Jonathon [name]  Procedure(s) Performed: Procedure(s): ESOPHAGOGASTRODUODENOSCOPY (EGD) WITH FOREIGN BODY REMOVAL (N/A)  Patient Location: PACU  Anesthesia Type:General  Level of Consciousness:  sedated, patient cooperative and responds to stimulation  Airway & Oxygen Therapy:Patient Spontanous Breathing and Patient connected to face mask oxgen  Post-op Assessment:  Report given to PACU RN and Post -op Vital signs reviewed and stable  Post vital signs:  Reviewed and stable  Last Vitals:  Vitals:   05/28/16 1811 05/28/16 1905  BP: 144/95   Pulse: 70 72  Resp: 16 18  Temp:      Complications: No apparent anesthesia complications

## 2016-05-28 NOTE — Anesthesia Procedure Notes (Signed)
Procedure Name: Intubation Date/Time: 05/28/2016 8:04 PM Performed by: Early OsmondEARGLE, Damarrion Mimbs E Pre-anesthesia Checklist: Patient identified, Emergency Drugs available, Suction available and Patient being monitored Patient Re-evaluated:Patient Re-evaluated prior to inductionOxygen Delivery Method: Circle system utilized Preoxygenation: Pre-oxygenation with 100% oxygen Intubation Type: IV induction, Rapid sequence and Cricoid Pressure applied Laryngoscope Size: Miller and 2 Grade View: Grade I Tube type: Oral Tube size: 7.0 mm Number of attempts: 1 Airway Equipment and Method: Stylet Placement Confirmation: ETT inserted through vocal cords under direct vision,  positive ETCO2 and breath sounds checked- equal and bilateral Secured at: 22 cm Tube secured with: Tape Dental Injury: Teeth and Oropharynx as per pre-operative assessment

## 2016-05-28 NOTE — ED Notes (Signed)
Bed: WA10 Expected date:  Expected time:  Means of arrival:  Comments: EMS-SOB 

## 2016-05-28 NOTE — ED Notes (Signed)
GI at bedside

## 2016-05-29 ENCOUNTER — Other Ambulatory Visit: Payer: Self-pay | Admitting: Gastroenterology

## 2016-05-29 ENCOUNTER — Encounter (HOSPITAL_COMMUNITY): Payer: Self-pay | Admitting: Gastroenterology

## 2016-05-29 DIAGNOSIS — R1310 Dysphagia, unspecified: Secondary | ICD-10-CM | POA: Diagnosis not present

## 2016-05-29 DIAGNOSIS — K219 Gastro-esophageal reflux disease without esophagitis: Secondary | ICD-10-CM | POA: Diagnosis not present

## 2016-06-04 ENCOUNTER — Ambulatory Visit
Admission: RE | Admit: 2016-06-04 | Discharge: 2016-06-04 | Disposition: A | Discharge status: Home / Self Care | Payer: Medicare Other | Source: Ambulatory Visit | Attending: Gastroenterology | Admitting: Gastroenterology

## 2016-06-04 DIAGNOSIS — K224 Dyskinesia of esophagus: Secondary | ICD-10-CM | POA: Diagnosis not present

## 2016-06-04 DIAGNOSIS — R1310 Dysphagia, unspecified: Secondary | ICD-10-CM | POA: Diagnosis not present

## 2016-07-14 DIAGNOSIS — G609 Hereditary and idiopathic neuropathy, unspecified: Secondary | ICD-10-CM | POA: Diagnosis not present

## 2016-07-14 DIAGNOSIS — F419 Anxiety disorder, unspecified: Secondary | ICD-10-CM | POA: Diagnosis not present

## 2016-07-21 ENCOUNTER — Ambulatory Visit (INDEPENDENT_AMBULATORY_CARE_PROVIDER_SITE_OTHER): Payer: Medicare Other | Admitting: Family Medicine

## 2016-07-21 ENCOUNTER — Encounter: Payer: Self-pay | Admitting: Family Medicine

## 2016-07-21 VITALS — BP 116/82 | HR 75 | Temp 98.2°F | Ht 69.5 in | Wt 212.0 lb

## 2016-07-21 DIAGNOSIS — M5136 Other intervertebral disc degeneration, lumbar region: Secondary | ICD-10-CM | POA: Insufficient documentation

## 2016-07-21 DIAGNOSIS — H3530 Unspecified macular degeneration: Secondary | ICD-10-CM | POA: Insufficient documentation

## 2016-07-21 DIAGNOSIS — F17200 Nicotine dependence, unspecified, uncomplicated: Secondary | ICD-10-CM | POA: Diagnosis not present

## 2016-07-21 DIAGNOSIS — G629 Polyneuropathy, unspecified: Secondary | ICD-10-CM | POA: Insufficient documentation

## 2016-07-21 DIAGNOSIS — F411 Generalized anxiety disorder: Secondary | ICD-10-CM | POA: Insufficient documentation

## 2016-07-21 DIAGNOSIS — F419 Anxiety disorder, unspecified: Secondary | ICD-10-CM | POA: Diagnosis not present

## 2016-07-21 DIAGNOSIS — F17210 Nicotine dependence, cigarettes, uncomplicated: Secondary | ICD-10-CM | POA: Insufficient documentation

## 2016-07-21 HISTORY — DX: Unspecified macular degeneration: H35.30

## 2016-07-21 MED ORDER — SERTRALINE HCL 50 MG PO TABS: 50.0000 mg | ORAL_TABLET | Freq: Every day | ORAL | 3 refills | Status: DC

## 2016-07-21 NOTE — Assessment & Plan Note (Signed)
S: Follows with optho- areds2. He states he is unsure of diagnosis.  A/P: continue optho follow up

## 2016-07-21 NOTE — Progress Notes (Signed)
Phone: [phone]  Subjective:  Patient presents today to establish care.  Prior patient of Doristine Counter of [facility]. Chief complaint-noted.   See problem oriented charting  The following were reviewed and entered/updated in epic: Past Medical History:  Diagnosis Date  . Anxiety    never tried on SSRI. xanax 0.5mg  BID through Dr. Doristine Counter.   . DDD (degenerative disc disease), lumbar    lumbar DDD- follows with orthopedist Dr. Leron Croak ortho. mobic 15mg  daily and hydrocodone 5/325 prn  . Macular degeneration 07/21/2016   Follows with optho- areds2. He states he is unsure of diagnosis.   . Neuropathy (HCC)    unknown cause ? back related(sees ortho for back). burning sensation in feet. gabapentin 100mg  after dinner, 2 before bed  . Smoker    8-10 cigarettes a day. plus nicorette. 60 years. over 50 pack years  . Venous stasis     in the past- Wound center on legs. Elevate. Compression stockings.    Patient Active Problem List   Diagnosis Date Noted  . Anxiety     Priority: High  . Smoker     Priority: High  . Neuropathy (HCC)     Priority: Medium  . DDD (degenerative disc disease), lumbar     Priority: Medium  . Macular degeneration 07/21/2016    Priority: Low   Past Surgical History:  Procedure Laterality Date  . ESOPHAGOGASTRODUODENOSCOPY Left 05/28/2016    Surgeon: Charna Elizabeth, MD; food stuck in esophagus    Family History  Problem Relation Age of Onset  . Other Mother     "old age" per patient   . Dementia Mother   . Alcohol abuse Father     contributed  . Cancer Sister     unknown type  . Alcohol abuse Brother     Medications- reviewed and updated Current Outpatient Prescriptions  Medication Sig Dispense Refill  . ALPRAZolam (XANAX) 0.5 MG tablet Take 0.5 mg by mouth 2 (two) times daily.    Marland Kitchen aspirin EC 81 MG tablet Take 81 mg by mouth daily.    Marland Kitchen gabapentin (NEURONTIN) 400 MG capsule TAKE 1 CAPSULE MIDDAY AND 2 CAPSULES AT BEDTIME.    Marland Kitchen  HYDROcodone-acetaminophen (NORCO/VICODIN) 5-325 MG tablet Take 1 tablet by mouth every 6 (six) hours as needed for moderate pain.    . meloxicam (MOBIC) 15 MG tablet Take 15 mg by mouth daily.    . Multiple Vitamins-Minerals (PRESERVISION AREDS 2 PO) Take 1 tablet by mouth daily.    . sertraline (ZOLOFT) 50 MG tablet Take 1 tablet (50 mg total) by mouth daily. 30 tablet 3   No current facility-administered medications for this visit.     Allergies-reviewed and updated Allergies  Allergen Reactions  . Penicillins Rash    Has patient had a PCN reaction causing immediate rash, facial/tongue/throat swelling, SOB or lightheadedness with hypotension: no Has patient had a PCN reaction causing severe rash involving mucus membranes or skin necrosis:yes Has patient had a PCN reaction that required hospitalization: no Has patient had a PCN reaction occurring within the last 10 years: no If all of the above answers are "NO", then may proceed with Cephalosporin use.     Social History   Social History  . Marital status: Married    Spouse name: N/A  . Number of children: N/A  . Years of education: N/A   Social History Main Topics  . Smoking status: Current Every Day Smoker    Types: Cigarettes  . Smokeless tobacco:  Never Used     Comment: wants to continue to cut back  . Alcohol use Yes     Comment: SOCIALLY  . Drug use: No  . Sexual activity: Not Asked   Other Topics Concern  . None   Social History Narrative   Married around 1964. 3 children. 6 grandkids.       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  HENT: Negative for hearing loss and tinnitus.   Eyes: Negative for blurred vision and double vision.  Respiratory: Negative for cough and hemoptysis.   Cardiovascular: Negative for chest pain and palpitations.    Gastrointestinal: Negative for heartburn and nausea.  Genitourinary: Negative for dysuria.  Musculoskeletal: Positive for back pain. Negative for neck pain.  Skin: Negative for itching and rash.  Neurological: Negative for dizziness and headaches.  Endo/Heme/Allergies: Negative for polydipsia. Does not bruise/bleed easily.  Psychiatric/Behavioral: Negative for depression, substance abuse and suicidal ideas.   Objective: BP 116/82 (BP Location: Left Arm, Patient Position: Sitting, Cuff Size: Large)   Pulse 75   Temp 98.2 F (36.8 C) (Oral)   Ht 5' 9.5" (1.765 m)   Wt 212 lb (96.2 kg)   SpO2 97%   BMI 30.86 kg/m  Gen: NAD, resting comfortably HEENT: Mucous membranes are moist. Oropharynx normal. TM normal. Eyes: sclera and lids normal, PERRLA Neck: no thyromegaly, no cervical lymphadenopathy CV: RRR no murmurs rubs or gallops Lungs: CTAB no crackles, wheeze, rhonchi Abdomen: soft/nontender/nondistended/normal bowel sounds. No rebound or guarding.  Ext: no edema Skin: warm, dry Neuro: 5/5 strength in upper and lower extremities, normal gait, normal reflexes  Assessment/Plan:  Smoker S: 8-10 cigarettes a day. plus nicorette. 60 years. over 50 pack years A/P: refer to lung cancer screening. Advised cessation- he wants to try to continue to cut down slowly- had been 1 ppd  Neuropathy (HCC) S: Left foot- back related(sees ortho for back). burning sensation in feet. gabapentin 400mg  after lunch, 800mg  before bed A/P: will continue current dose- recently refilled- will change to my name when needed   Macular degeneration S: Follows with optho- areds2. He states he is unsure of diagnosis.  A/P: continue optho follow up  DDD (degenerative disc disease), lumbar S: lumbar DDD- follows with orthopedist Dr. Leron Croak ortho. mobic 15mg  daily and hydrocodone 5/325 prn A/P: continue follow up wit Dr. Otelia Sergeant. Glad no regular hydrocodone use- want to reduce risk with combo  xanax/hydrocodone given scheduled xanax  Anxiety S: never tried on SSRI. xanax 0.5mg  BID through Dr. Doristine Counter.  A/P: we discussed SSRI preferred therapy. Start low dose zoloft 50mg . Follow up 6 weeks. Will get EKG at that time   Discussed benzodiazepine especially with narcotic is high risk. Will try to reduce benzo use. Continue ortho follow up for pain aspect  Return in about 6 weeks (around 09/01/2016).   Orders Placed This Encounter  Procedures  . Ambulatory Referral for Lung Cancer Scre    Referral Priority:   Routine    Referral Type:   Consultation    Referral Reason:   Specialty Services Required    Number of Visits Requested:   1    Meds ordered this encounter  Medications  . meloxicam (MOBIC) 15 MG tablet    Sig: Take 15 mg by mouth daily.  Marland Kitchen HYDROcodone-acetaminophen (NORCO/VICODIN) 5-325 MG tablet    Sig: Take 1  tablet by mouth every 6 (six) hours as needed for moderate pain.  Marland Kitchen. DISCONTD: Aspirin-Salicylamide-Caffeine (ARTHRITIS STRENGTH BC POWDER PO)    Sig: Take by mouth.  . Multiple Vitamins-Minerals (PRESERVISION AREDS 2 PO)    Sig: Take 1 tablet by mouth daily.  Marland Kitchen. gabapentin (NEURONTIN) 400 MG capsule    Sig: TAKE 1 CAPSULE MIDDAY AND 2 CAPSULES AT BEDTIME.  Marland Kitchen. aspirin EC 81 MG tablet    Sig: Take 81 mg by mouth daily.  . sertraline (ZOLOFT) 50 MG tablet    Sig: Take 1 tablet (50 mg total) by mouth daily.    Dispense:  30 tablet    Refill:  3    Return precautions advised.  Tana ConchStephen Jiana Lemaire, MD

## 2016-07-21 NOTE — Assessment & Plan Note (Signed)
S: Left foot- back related(sees ortho for back). burning sensation in feet. gabapentin 400mg  after lunch, 800mg  before bed A/P: will continue current dose- recently refilled- will change to my name when needed

## 2016-07-21 NOTE — Patient Instructions (Signed)
Congratulations on working to quit smoking! I would consider adding a 7mg  or 14mg  patch then continue the gum to see if we could help you quit smoking.   Stop the [facility] powder. Can start aspirin 81mg  alone.   We will get you a handout for [facility] for some compression stockings   We will call you within a week or two about your referral to lung cancer screening program. If you do not hear within 3 weeks, give us a call.   Start zoloft 50mg . For now continue xanax twice a day. Will try to reduce next visit  Taking the medicine as directed and not missing any doses is one of the best things you can do to treat your depression.  Here are some things to keep in mind:  1) Side effects (stomach upset, some increased anxiety) may happen before you notice a benefit.  These side effects typically go away over time. 2) Changes to your dose of medicine or a change in medication all together is sometimes necessary 3) Most people need to be on medication at least 6-12 months 4) Many people will notice an improvement within two weeks but the full effect of the medication can take up to 4-6 weeks 5) Stopping the medication when you start feeling better often results in a return of symptoms 6) If you start having thoughts of hurting yourself or others after starting this medicine, call our office immediately at [phone] or seek care through 911.

## 2016-07-21 NOTE — Assessment & Plan Note (Addendum)
S: never tried on SSRI. xanax 0.5mg  BID through Dr. Doristine CounterBurnett.  A/P: we discussed SSRI preferred therapy. Start low dose zoloft 50mg . Follow up 6 weeks. Will get EKG at that time   Discussed benzodiazepine especially with narcotic is high risk. Will try to reduce benzo use. Continue ortho follow up for pain aspect

## 2016-07-21 NOTE — Assessment & Plan Note (Signed)
S: lumbar DDD- [facility]. mobic 15mg  daily and hydrocodone 5/325 prn A/P: continue follow up wit Dr. Otelia SergeantNitka. Glad no regular hydrocodone use- want to reduce risk with combo xanax/hydrocodone given scheduled xanax

## 2016-07-21 NOTE — Assessment & Plan Note (Signed)
S: 8-10 cigarettes a day. plus nicorette. 60 years. over 50 pack years A/P: refer to lung cancer screening. Advised cessation- he wants to try to continue to cut down slowly- had been 1 ppd

## 2016-07-21 NOTE — Progress Notes (Signed)
Pre visit review using our clinic review tool, if applicable. No additional management support is needed unless otherwise documented below in the visit note. 

## 2016-07-23 ENCOUNTER — Other Ambulatory Visit (INDEPENDENT_AMBULATORY_CARE_PROVIDER_SITE_OTHER): Payer: Self-pay | Admitting: Specialist

## 2016-07-28 ENCOUNTER — Other Ambulatory Visit (INDEPENDENT_AMBULATORY_CARE_PROVIDER_SITE_OTHER): Payer: Self-pay | Admitting: Radiology

## 2016-08-06 ENCOUNTER — Telehealth: Payer: Self-pay | Admitting: Family Medicine

## 2016-08-06 NOTE — Telephone Encounter (Signed)
Pt would like to know if was referred to a pulmonary doctor.

## 2016-08-08 NOTE — Telephone Encounter (Signed)
Called and spoke to patient who states he accidentally erased the voicemail where he was called about his screening. I sent a message to Shelbie Proctoreborah Dawkins, our referral coordinator asking her to follow up.

## 2016-08-08 NOTE — Telephone Encounter (Signed)
Pt was referred for lung cancer screening on 07/21/16.

## 2016-08-12 DIAGNOSIS — Z1211 Encounter for screening for malignant neoplasm of colon: Secondary | ICD-10-CM | POA: Diagnosis not present

## 2016-08-12 DIAGNOSIS — K5903 Drug induced constipation: Secondary | ICD-10-CM | POA: Diagnosis not present

## 2016-08-19 ENCOUNTER — Other Ambulatory Visit: Payer: Self-pay

## 2016-08-19 DIAGNOSIS — Z87891 Personal history of nicotine dependence: Secondary | ICD-10-CM

## 2016-08-21 ENCOUNTER — Other Ambulatory Visit: Payer: Self-pay | Admitting: Acute Care

## 2016-08-21 DIAGNOSIS — F17210 Nicotine dependence, cigarettes, uncomplicated: Secondary | ICD-10-CM

## 2016-08-28 ENCOUNTER — Ambulatory Visit (INDEPENDENT_AMBULATORY_CARE_PROVIDER_SITE_OTHER): Payer: Medicare Other | Admitting: Family Medicine

## 2016-08-28 ENCOUNTER — Encounter: Payer: Self-pay | Admitting: Family Medicine

## 2016-08-28 VITALS — BP 110/72 | HR 65 | Temp 97.9°F | Ht 69.5 in | Wt 211.6 lb

## 2016-08-28 DIAGNOSIS — Z8489 Family history of other specified conditions: Secondary | ICD-10-CM | POA: Diagnosis not present

## 2016-08-28 DIAGNOSIS — F419 Anxiety disorder, unspecified: Secondary | ICD-10-CM | POA: Diagnosis not present

## 2016-08-28 DIAGNOSIS — F17200 Nicotine dependence, unspecified, uncomplicated: Secondary | ICD-10-CM | POA: Diagnosis not present

## 2016-08-28 DIAGNOSIS — Z79899 Other long term (current) drug therapy: Secondary | ICD-10-CM | POA: Diagnosis not present

## 2016-08-28 MED ORDER — SERTRALINE HCL 100 MG PO TABS: 100.0000 mg | ORAL_TABLET | Freq: Every day | ORAL | 5 refills | Status: DC

## 2016-08-28 NOTE — Progress Notes (Signed)
Subjective:  Jonathon [name] is a 76 y.o. year old very pleasant male patient who presents for/with See problem oriented charting ROS- states no change in anxiety   Past Medical History-  Patient Active Problem List   Diagnosis Date Noted  . Anxiety     Priority: High  . Smoker     Priority: High  . Neuropathy ([facility])     Priority: Medium  . DDD (degenerative disc disease), lumbar     Priority: Medium  . Macular degeneration 07/21/2016    Priority: Low  . Family history of genetic disease 08/28/2016    Medications- reviewed and updated Current Outpatient Prescriptions  Medication Sig Dispense Refill  . ALPRAZolam (XANAX) 0.5 MG tablet Take 0.5 mg by mouth 2 (two) times daily.    Marland Kitchen aspirin EC 81 MG tablet Take 81 mg by mouth daily.    Marland Kitchen gabapentin (NEURONTIN) 400 MG capsule TAKE 1 CAPSULE MIDDAY AND 2 CAPSULES AT BEDTIME.    Marland Kitchen HYDROcodone-acetaminophen (NORCO/VICODIN) 5-325 MG tablet Take 1 tablet by mouth every 6 (six) hours as needed for moderate pain.    . meloxicam (MOBIC) 15 MG tablet TAKE 1 TABLET BY MOUTH EVERY DAY WITH EVENING MEAL 30 tablet 4  . Multiple Vitamins-Minerals (PRESERVISION AREDS 2 PO) Take 1 tablet by mouth daily.    . sertraline (ZOLOFT) 50 MG tablet Take 1 tablet (50 mg total) by mouth daily. 30 tablet 3   No current facility-administered medications for this visit.     Objective: BP 110/72 (BP Location: Left Arm, Patient Position: Sitting, Cuff Size: Large)   Pulse 65   Temp 97.9 F (36.6 C) (Oral)   Ht 5' 9.5" (1.765 m)   Wt 211 lb 9.6 oz (96 kg)   SpO2 97%   BMI 30.80 kg/m  Gen: NAD, resting comfortably, smells of smoke CV: RRR no murmurs rubs or gallops Lungs: CTAB no crackles, wheeze, rhonchi Ext: no edema under compression stockings Skin: warm, dry, no rash  EKG: Rate 65 normal rhythm (read as atrial fibrillation but I can see P waves in all leads particularly III, v3, v4, v5 rather clear). Normal intervals. No hypertrophy. No qt or t wave  changes.   Assessment/Plan:  Smoker Still up to 1/2 ppd- advised cessation. Lung cancer screening planned on 09/05/16. Over 30 pack years. 1/2 PPD at present over 60 years but was much heavier in past.   Anxiety S:Xanax 0.37m BID thorugh Dr. BTollie Pizza Started zoloft 570mlast visit- only issue has been  grumbling stomach. Does not feel like anxiety has improved or that he could trial without xanax A/P: will titrate to 10087moloft as long as EKG without qt prolongation  Family history of genetic disease S: Half sister (same mom) with positive PALB2 gene. Variant of uncertain significance identified in dicer1.  A/P: Tried to order this test but unable to find any order with this in computer- will refer to genetics/genetic counseling for more information   Return in about 2 months (around 10/28/2016).  Orders Placed This Encounter  Procedures  . Ambulatory referral to Genetics    Referral Priority:   Routine    Referral Type:   Consultation    Referral Reason:   Specialty Services Required    Number of Visits Requested:   1  . EKG 12-Lead    Meds ordered this encounter  Medications  . sertraline (ZOLOFT) 100 MG tablet    Sig: Take 1 tablet (100 mg total) by mouth daily.  Dispense:  30 tablet    Refill:  5    Return precautions advised.  Garret Reddish, MD

## 2016-08-28 NOTE — Assessment & Plan Note (Addendum)
S:Xanax 0.5mg  BID thorugh Dr. Doristine CounterBurnett. Started zoloft 50mg  last visit- only issue has been  grumbling stomach. Does not feel like anxiety has improved or that he could trial without xanax A/P: will titrate to 100mg  zoloft as long as EKG without qt prolongation

## 2016-08-28 NOTE — Assessment & Plan Note (Signed)
S: Half sister (same mom) with positive PALB2 gene. Variant of uncertain significance identified in dicer1.  A/P: Tried to order this test but unable to find any order with this in computer- will refer to genetics/genetic counseling for more information 

## 2016-08-28 NOTE — Assessment & Plan Note (Signed)
Still up to 1/2 ppd- advised cessation. Lung cancer screening planned on 09/05/16. Over 30 pack years. 1/2 PPD at present over 60 years but was much heavier in past.

## 2016-08-28 NOTE — Patient Instructions (Addendum)
Lets follow up in 2 months. I want you to give at least a few days trial without xanax in daytime before you see me back. Perhaps two times within a week of coming in and journal how you are feeling/doing  We will call you within a week or two about your referral to genetics since I cannot find the way to order the genetic testing you are interested in- hopefully they can help us. If you do not hear within 3 weeks, give us a call.

## 2016-08-28 NOTE — Progress Notes (Signed)
Pre visit review using our clinic review tool, if applicable. No additional management support is needed unless otherwise documented below in the visit note. 

## 2016-09-10 ENCOUNTER — Ambulatory Visit (INDEPENDENT_AMBULATORY_CARE_PROVIDER_SITE_OTHER): Payer: Medicare Other | Admitting: Acute Care

## 2016-09-10 ENCOUNTER — Ambulatory Visit (INDEPENDENT_AMBULATORY_CARE_PROVIDER_SITE_OTHER)
Admission: RE | Admit: 2016-09-10 | Discharge: 2016-09-10 | Disposition: A | Discharge status: Home / Self Care | Payer: Medicare Other | Source: Ambulatory Visit | Attending: Acute Care | Admitting: Acute Care

## 2016-09-10 ENCOUNTER — Encounter: Payer: Self-pay | Admitting: Acute Care

## 2016-09-10 DIAGNOSIS — F17210 Nicotine dependence, cigarettes, uncomplicated: Secondary | ICD-10-CM | POA: Diagnosis not present

## 2016-09-10 DIAGNOSIS — Z87891 Personal history of nicotine dependence: Secondary | ICD-10-CM | POA: Diagnosis not present

## 2016-09-10 NOTE — Progress Notes (Signed)
Shared Decision Making Visit Lung Cancer Screening Program [phone]([id number])   Eligibility:  Age 76 y.o.  Pack Years Smoking History Calculation 59 pack year smoking history (# packs/per year x # years smoked)  Recent History of coughing up blood  no  Unexplained weight loss? no ( >Than 15 pounds within the last 6 months )  Prior History Lung / other cancer no (Diagnosis within the last 5 years already requiring surveillance chest CT Scans).  Smoking Status Current Smoker  Former Smokers: Years since quit: NA  Quit Date: NA  Visit Components:  Discussion included one or more decision making aids. yes  Discussion included risk/benefits of screening. yes  Discussion included potential follow up diagnostic testing for abnormal scans. yes  Discussion included meaning and risk of over diagnosis. yes  Discussion included meaning and risk of False Positives. yes  Discussion included meaning of total radiation exposure. yes  Counseling Included:  Importance of adherence to annual lung cancer LDCT screening. yes  Impact of comorbidities on ability to participate in the program. yes  Ability and willingness to under diagnostic treatment. yes  Smoking Cessation Counseling:  Current Smokers:   Discussed importance of smoking cessation. yes  Information about tobacco cessation classes and interventions provided to patient. yes  Patient provided with "ticket" for LDCT Scan. yes  Symptomatic Patient. no  Counseling  Diagnosis Code: Tobacco Use Z72.0  Asymptomatic Patient yes  Counseling (Intermediate counseling: > three minutes counseling) [id number]  Former Smokers:   Discussed the importance of maintaining cigarette abstinence. yes  Diagnosis Code: Personal History of Nicotine Dependence. U98.119Z87.891  Information about tobacco cessation classes and interventions provided to patient. Yes  Patient provided with "ticket" for LDCT Scan. yes  Written Order for Lung Cancer  Screening with LDCT placed in Epic. Yes (CT Chest Lung Cancer Screening Low Dose W/O CM) [id number] Z12.2-Screening of respiratory organs Z87.891-Personal history of nicotine dependence  I have spent 25 minutes of face to face time with Mr. Okey DupreCrawford discussing the risks and benefits of lung cancer screening. We viewed a power point together that explained in detail the above noted topics. We paused at intervals to allow for questions to be asked and answered to ensure understanding.We discussed that the single most powerful action that he can take to decrease his risk of developing lung cancer is to quit smoking. We discussed whether or not he is ready to commit to setting a quit date. He is currently not ready to quit. We discussed options for tools to aid in quitting smoking including nicotine replacement therapy, non-nicotine medications, support groups, Quit Smart classes, and behavior modification. We discussed that often times setting smaller, more achievable goals, such as eliminating 1 cigarette a day for a week and then 2 cigarettes a day for a week can be helpful in slowly decreasing the number of cigarettes smoked. This allows for a sense of accomplishment as well as providing a clinical benefit. I gave him the " Be Stronger Than Your Excuses" card with contact information for community resources, classes, free nicotine replacement therapy, and access to mobile apps, text messaging, and on-line smoking cessation help. I have also given him my card and contact information in the event he  needs to contact me. We discussed the time and location of the scan, and that either Abigail Miyamotoenise Phelps, RN or I will call with the results within 24-48 hours of receiving them. I have provided him  with a copy of the power point we viewed  as  a resource in the event they need reinforcement of the concepts we discussed today in the office. The patient verbalized understanding of all of  the above and had no further questions  upon leaving the office. They have my contact information in the event they have any further questions.  I spent 4 minutes counseling about smoking cessation and the health risks of continued tobacco abuse.  I explained that there has been a high incidence of CAD noted on these scans.I explained that this is a non-gated scan, and therefore degree or severity of disease cannot be determined. Mr. Essman is not on statin therapy. I explained that we will send a copy of the scan results to his PCP, and for him to follow up with them regarding plan of care, as they know his health history well.He verbalized understanding.  Bevelyn Ngo, NP 09/10/2016

## 2016-09-11 ENCOUNTER — Telehealth: Payer: Self-pay | Admitting: Emergency Medicine

## 2016-09-11 DIAGNOSIS — R9389 Abnormal findings on diagnostic imaging of other specified body structures: Secondary | ICD-10-CM

## 2016-09-11 NOTE — Telephone Encounter (Signed)
I reviewed the patient's CT scan of the chest from 09/10/16. There are multiple nodules the largest is 16 mm. Some are calcified and some are not. Recommend that he have a PET scan and then be referred to pulmonary medicine to discuss results, decide on next steps.

## 2016-09-15 ENCOUNTER — Encounter: Payer: Self-pay | Admitting: Genetics

## 2016-09-15 ENCOUNTER — Telehealth: Payer: Self-pay | Admitting: Genetics

## 2016-09-15 NOTE — Telephone Encounter (Signed)
I have called Jonathon [name] with the results of his Low Dose Ct. I explained that his scan was read as a Lung RADS 4 B indicating  suspicious findings for which additional diagnostic testing and or tissue sampling is recommended. I explained that I have reviewed his scan with Dr. Delton Coombes, and we feel the best plan is for PET scan and then referral to Pulmonary for short term follow up.We discussed the use of PET scan during the shared decision making visit, so he understands this process. He verbalized understanding of the above and had no further questions at completion of the call. I also let him know I would share the results of the scan with Dr. [name], his PCP. He verbalized understanding.

## 2016-09-15 NOTE — Telephone Encounter (Signed)
Appt has been scheduled for the pt to see Tobi Bastos for genetic counseling on 4/19 at 9am. Pt aware to arrive 30 minutes early. Demographics verified. Letter mailed.

## 2016-09-22 ENCOUNTER — Encounter (HOSPITAL_COMMUNITY)
Admission: RE | Admit: 2016-09-22 | Discharge: 2016-09-22 | Disposition: A | Discharge status: Home / Self Care | Payer: Medicare Other | Source: Ambulatory Visit | Attending: Acute Care | Admitting: Acute Care

## 2016-09-22 DIAGNOSIS — R9389 Abnormal findings on diagnostic imaging of other specified body structures: Secondary | ICD-10-CM

## 2016-09-22 DIAGNOSIS — R938 Abnormal findings on diagnostic imaging of other specified body structures: Secondary | ICD-10-CM | POA: Diagnosis not present

## 2016-09-22 DIAGNOSIS — R911 Solitary pulmonary nodule: Secondary | ICD-10-CM | POA: Diagnosis not present

## 2016-09-22 LAB — GLUCOSE, CAPILLARY: Glucose-Capillary: 93 mg/dL (ref 65–99)

## 2016-09-22 MED ORDER — FLUDEOXYGLUCOSE F - 18 (FDG) INJECTION: 10.3000 | Freq: Once | INTRAVENOUS | Status: DC | PRN

## 2016-09-23 ENCOUNTER — Ambulatory Visit (INDEPENDENT_AMBULATORY_CARE_PROVIDER_SITE_OTHER): Payer: Medicare Other | Admitting: Pulmonary Disease

## 2016-09-23 ENCOUNTER — Encounter: Payer: Self-pay | Admitting: Pulmonary Disease

## 2016-09-23 VITALS — BP 116/70 | HR 60 | Ht 69.5 in | Wt 209.6 lb

## 2016-09-23 DIAGNOSIS — R590 Localized enlarged lymph nodes: Secondary | ICD-10-CM | POA: Diagnosis not present

## 2016-09-23 DIAGNOSIS — R599 Enlarged lymph nodes, unspecified: Secondary | ICD-10-CM | POA: Insufficient documentation

## 2016-09-23 DIAGNOSIS — F17200 Nicotine dependence, unspecified, uncomplicated: Secondary | ICD-10-CM | POA: Diagnosis not present

## 2016-09-23 NOTE — Progress Notes (Signed)
Subjective:    Patient ID: Jonathon [name], male    DOB: [birth year], 76 y.o.   MRN: [id number]  HPI  76 year old smoker referred for abnormal screening CAT scan He started smoking is a 39 year old, about half pack per day now, more than 50 pack years. He underwent screening CT chest on 09/10/16 which showed scattered pulmonary nodules measuring up to 16 mm the subpleural right lower lobe, there was another 8 mm subpleural nodule in the super segment left lower lobe-this was labeled as RADS category 4 B. He then underwent PET scan which showed minimal metabolic activity in the right subpleural nodule indicating benign etiology. However this picked up a hypermetabolic right hilar lymph node-this lymph node was not noted on the earlier noncontrast CT. There was diffuse activity throughout the esophagus favoring esophagitis   He denies shortness of breath, wheezing, frequent chest colds, orthopnea or paroxysmal nocturnal dyspnea. He denies weight loss or low-grade fevers. He denies GERD or chronic cough  Spirometry surprisingly does not show any evidence of airway obstruction with a ratio of 99 an FEV1 of 72% and FVC of 52% suggesting moderate restriction    Past Medical History:  Diagnosis Date  . Anxiety    never tried on SSRI. xanax 0.5mg  BID through Dr. Doristine Counter.   . DDD (degenerative disc disease), lumbar    lumbar DDD- follows with orthopedist Dr. Leron Croak ortho. mobic  daily and hydrocodone 5/325 prn  . Macular degeneration 07/21/2016   Follows with optho- areds2. He states he is unsure of diagnosis.   . Neuropathy (HCC)    unknown cause ? back related(sees ortho for back). burning sensation in feet. gabapentin  after dinner, 2 before bed  . Smoker    8-10 cigarettes a day. plus nicorette. 60 years. over 50 pack years  . Venous stasis     in the past- Wound center on legs. Elevate. Compression stockings.    Past Surgical History:  Procedure Laterality Date  .  ESOPHAGOGASTRODUODENOSCOPY Left 05/28/2016    Surgeon: Charna Elizabeth, MD; food stuck in esophagus      Allergies  Allergen Reactions  . Penicillins Rash    Has patient had a PCN reaction causing immediate rash, facial/tongue/throat swelling, SOB or lightheadedness with hypotension: no Has patient had a PCN reaction causing severe rash involving mucus membranes or skin necrosis:yes Has patient had a PCN reaction that required hospitalization: no Has patient had a PCN reaction occurring within the last 10 years: no If all of the above answers are "NO", then may proceed with Cephalosporin use.     Social History   Social History  . Marital status: Married    Spouse name: N/A  . Number of children: N/A  . Years of education: N/A   Occupational History  . Not on file.   Social History Main Topics  . Smoking status: Current Every Day Smoker    Packs/day: 1.00    Years: 59.00    Types: Cigarettes  . Smokeless tobacco: Never Used     Comment: wants to continue to cut back  . Alcohol use Yes     Comment: SOCIALLY  . Drug use: No  . Sexual activity: Not on file   Other Topics Concern  . Not on file   Social History Narrative   Married around 1964. 3 children. 6 grandkids.       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 Other Mother     "old age" per patient   . Dementia Mother   . Alcohol abuse Father     contributed  . Cancer Sister     unknown type  . Alcohol abuse Brother     Review of Systems Constitutional: negative for anorexia, fevers and sweats  Eyes: negative for irritation, redness and visual disturbance  Ears, nose, mouth, throat, and face: negative for earaches, epistaxis, nasal congestion and sore throat  Respiratory: negative for cough, dyspnea on exertion, sputum and wheezing  Cardiovascular: negative for chest pain, dyspnea, lower  extremity edema, orthopnea, palpitations and syncope  Gastrointestinal: negative for abdominal pain, constipation, diarrhea, melena, nausea and vomiting  Genitourinary:negative for dysuria, frequency and hematuria  Hematologic/lymphatic: negative for bleeding, easy bruising and lymphadenopathy  Musculoskeletal:negative for arthralgias, muscle weakness and stiff joints  Neurological: negative for coordination problems, gait problems, headaches and weakness  Endocrine: negative for diabetic symptoms including polydipsia, polyuria and weight loss     Objective:   Physical Exam  Gen. Pleasant, well-nourished, in no distress, normal affect ENT - no lesions, no post nasal drip Neck: No JVD, no thyromegaly, no carotid bruits Lungs: no use of accessory muscles, no dullness to percussion, clear without rales or rhonchi  Cardiovascular: Rhythm regular, heart sounds  normal, no murmurs or gallops, no peripheral edema Abdomen: soft and non-tender, no hepatosplenomegaly, BS normal. Musculoskeletal: No deformities, no cyanosis or clubbing Neuro:  alert, non focal        Assessment & Plan:

## 2016-09-23 NOTE — Patient Instructions (Signed)
CT chest with contrast in 3 months to follow-up on an enlarged lymph node in the right lung  Smoking cessation was discussed

## 2016-09-23 NOTE — Assessment & Plan Note (Signed)
Smoking cessation was discussed in detail. ?

## 2016-09-23 NOTE — Assessment & Plan Note (Signed)
CT chest with contrast in 3 months to follow-up on an enlarged lymph node in the right lung  -Not seen on previous CT scan but this was without contrast. Could be benign or related infection, inflammation or cancer

## 2016-09-26 ENCOUNTER — Other Ambulatory Visit: Payer: Self-pay | Admitting: Acute Care

## 2016-09-26 ENCOUNTER — Telehealth: Payer: Self-pay | Admitting: Acute Care

## 2016-09-26 DIAGNOSIS — F17210 Nicotine dependence, cigarettes, uncomplicated: Secondary | ICD-10-CM

## 2016-09-26 NOTE — Telephone Encounter (Signed)
This patient needs a follow up CT in 3 months. Will you please place him on the tickle list? The order is already in the system, and I have faxed the PCP. Thanks so much.

## 2016-09-26 NOTE — Telephone Encounter (Signed)
I have called [name] with the results of his PET scan. He has spoken with Dr. Vassie Loll. Follow-up CT scan is planned for 3 months. Nothing fur needed.

## 2016-09-29 ENCOUNTER — Other Ambulatory Visit: Payer: Self-pay | Admitting: Acute Care

## 2016-09-30 ENCOUNTER — Telehealth: Payer: Self-pay | Admitting: Genetic Counselor

## 2016-09-30 NOTE — Telephone Encounter (Signed)
LM on VM asking whether he could bring a copy of his sister's genetic testing with the PALB2 mutation in to his appointment.  Asked that he CB on my work phone. 

## 2016-10-02 ENCOUNTER — Other Ambulatory Visit: Payer: Self-pay

## 2016-10-02 ENCOUNTER — Ambulatory Visit (HOSPITAL_BASED_OUTPATIENT_CLINIC_OR_DEPARTMENT_OTHER): Payer: Medicare Other | Admitting: Genetic Counselor

## 2016-10-02 ENCOUNTER — Other Ambulatory Visit: Payer: Medicare Other

## 2016-10-02 ENCOUNTER — Encounter: Payer: Self-pay | Admitting: Genetic Counselor

## 2016-10-02 DIAGNOSIS — Z800 Family history of malignant neoplasm of digestive organs: Secondary | ICD-10-CM | POA: Diagnosis not present

## 2016-10-02 DIAGNOSIS — Z8489 Family history of other specified conditions: Secondary | ICD-10-CM

## 2016-10-02 DIAGNOSIS — Z803 Family history of malignant neoplasm of breast: Secondary | ICD-10-CM | POA: Diagnosis present

## 2016-10-02 DIAGNOSIS — Z7183 Encounter for nonprocreative genetic counseling: Secondary | ICD-10-CM | POA: Diagnosis not present

## 2016-10-02 MED ORDER — SERTRALINE HCL 100 MG PO TABS: 100.0000 mg | ORAL_TABLET | Freq: Every day | ORAL | 1 refills | Status: DC

## 2016-10-02 NOTE — Progress Notes (Signed)
REFERRING PROVIDER: Marin Olp, MD [place] [place], [place] [address]  PRIMARY PROVIDER:  Garret Reddish, MD  PRIMARY REASON FOR VISIT:  1. Family history of genetic disease   2. Family history of breast cancer   3. Family history of colon cancer      HISTORY OF PRESENT ILLNESS:   Jonathon [name], a 76 y.o. male, was seen for a [place] cancer genetics consultation at the request of Dr. Yong Channel due to a family history of cancer.  Jonathon [name] presents to clinic today to discuss the possibility of a hereditary predisposition to cancer, genetic testing, and to further clarify his future cancer risks, as well as potential cancer risks for family members. Jonathon [name] is a 76 y.o. male with no personal history of cancer.  He is coming in, at the urging of his sister, to undergo genetic testing for PALB2.  CANCER HISTORY:   No history exists.       Past Medical History:  Diagnosis Date  . Anxiety    never tried on SSRI. xanax 0.57m BID through Dr. BTollie Pizza   . DDD (degenerative disc disease), lumbar    lumbar DDD- follows with orthopedist Dr. NLinus Ornortho. mobic 155mdaily and hydrocodone 5/325 prn  . Family history of breast cancer   . Macular degeneration 07/21/2016   Follows with optho- areds2. He states he is unsure of diagnosis.   . Neuropathy    unknown cause ? back related(sees ortho for back). burning sensation in feet. gabapentin 10067mfter dinner, 2 before bed  . Smoker    8-10 cigarettes a day. plus nicorette. 60 years. over 50 pack years  . Venous stasis     in the past- Wound center on legs. Elevate. Compression stockings.     Past Surgical History:  Procedure Laterality Date  . ESOPHAGOGASTRODUODENOSCOPY Left 05/28/2016    Surgeon: JyoJuanita CraverD; food stuck in esophagus    Social History   Social History  . Marital status: Married    Spouse name: N/A  . Number of children: N/A  . Years of education: N/A   Social History Main  Topics  . Smoking status: Current Every Day Smoker    Packs/day: 1.00    Years: 59.00    Types: Cigarettes  . Smokeless tobacco: Never Used     Comment: wants to continue to cut back  . Alcohol use Yes     Comment: SOCIALLY  . Drug use: No  . Sexual activity: Not on file   Other Topics Concern  . Not on file   Social History Narrative   Married around 1964. 3 children. 6 grandkids.       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  Significant diagnoses are listed below: Family History  Problem Relation Age of Onset  . Other Mother     "old age" per patient   . Dementia Mother   . Alcohol abuse Father     contributed  . Cancer Sister     unknown type  . Alcohol abuse Brother   . Breast cancer Sister     dx in her early 30s73s Other Sister     PALB2+  . Breast cancer Maternal Aunt   . Colon cancer Maternal Uncle   . Breast cancer Other 39 79 The patient has three children, two  boys and a girl, who are all cancer free.  He has one full sister and two maternal half siblings - a brother and sister.  His full sister died of an unknown cancer.  His maternal half sister was diagnosed with breast cancer in her early 49's and has a daughter who was diagnosed with breast cancer at 58.  Both of these individuals are positive for a PALB2 mutation.  He has a maternal half brother who is healthy and cancer free.  Both parents are deceased.  He has no information about his biological father's side of the family.  His mother died at 39 from non-cancer related issues.  She had six siblings, three sisters and a brother.  One brother had colon cancer over 80 and a sister had breast cancer under the age of 16.  His maternal grandmother died in childbirth, and his grandfather died from heart problems.  He thinks that his mother's paternal cousins may have had  colon cancer.  Patient's maternal ancestors are of Caucasian descent, and paternal ancestors are of unknown descent. There is no reported Ashkenazi Jewish ancestry. There is no known consanguinity.  GENETIC COUNSELING ASSESSMENT: Jonathon [name] is a 75 y.o. male with a family history of breast and colon cancer and a known family mutation in [place] which is somewhat suggestive of a hereditary breast cancer syndrome and predisposition to cancer. We, therefore, discussed and recommended the following at today's visit.   DISCUSSION: We discussed that about 5-10% of breast cancer is hereditary wit most cases due to BRCA mutations.  PALB2 is a moderate risk gene that increases the risk for breast cancer in women, male breast cancer and other cancers including pancreatic cancer.  Based on the patient's maternal half sister being a carrier for this mutation, the patient has an estimated 25% chance of also being a carrier, and a 75% chance of not.  Depending on his status will determine whether we need to offer testing to his children.   We discussed his deceased full sister and that she also had a risk for carrying this mutation.  Since she is deceased, we can offer testing to her children.  The patient does not know how to contact his sister's daughter, and her son is deceased.  However, her son's daughter, the patient's great niece, he is in contact with and can reach out to her about this testing.  I have the patient my card to pass on his niece.  We reviewed the characteristics, features and inheritance patterns of hereditary cancer syndromes. We also discussed genetic testing, including the appropriate family members to test, the process of testing, insurance coverage and turn-around-time for results. We discussed the implications of a negative, positive and/or variant of uncertain significant result. We recommended Jonathon [name] pursue genetic testing for the PALB2 gene.   PLAN: After considering the risks,  benefits, and limitations, Jonathon [name]  provided informed consent to pursue genetic testing and the blood sample was sent to [facility] for analysis of the PALB2 gene. Results should be available within approximately 2-3 weeks' time, at which point they will be disclosed by telephone to Jonathon [name], as will any additional recommendations warranted by these results. Jonathon [name] will receive a summary of his genetic counseling visit and a copy of his results once available. This information will also be available in Epic. We encouraged Jonathon [name] to remain in contact with cancer genetics annually so that we can continuously update the family history and inform  him of any changes in cancer genetics and testing that may be of benefit for his family. Jonathon [name] questions were answered to his satisfaction today. Our contact information was provided should additional questions or concerns arise.  Based on Jonathon [name]'s family history, we recommended his niece and great niece, who are descendants of his full sister who had an unknown cancer, have genetic counseling and testing. Jonathon [name] will let us know if we can be of any assistance in coordinating genetic counseling and/or testing for this family member.   Lastly, we encouraged Jonathon [name] to remain in contact with cancer genetics annually so that we can continuously update the family history and inform him of any changes in cancer genetics and testing that may be of benefit for this family.   Mr.  [name] questions were answered to his satisfaction today. Our contact information was provided should additional questions or concerns arise. Thank you for the referral and allowing [place] to share in the care of your patient.   Jonathon [name] P. Florene Glen, [place], [facility] Certified Genetic Counselor [email or web address] phone: [phone]  The patient was seen for a total of 30 minutes in face-to-face genetic counseling.  This patient was discussed  with Drs. Magrinat, Lindi Adie and/or Burr Medico who agrees with the above.    _______________________________________________________________________ For Office Staff:  Number of people involved in session: 1 Was an Intern/ student involved with case: no

## 2016-10-14 ENCOUNTER — Telehealth: Payer: Self-pay | Admitting: Genetic Counselor

## 2016-10-14 ENCOUNTER — Encounter: Payer: Self-pay | Admitting: Genetic Counselor

## 2016-10-14 DIAGNOSIS — Z1379 Encounter for other screening for genetic and chromosomal anomalies: Secondary | ICD-10-CM | POA: Insufficient documentation

## 2016-10-14 NOTE — Telephone Encounter (Signed)
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  We recommend that his children undergo genetic testing.  I offered to bring him in with his children so we could all discuss this result and then we could offer further testing to his children if they so desire.  He will call me back and let me know when it would be a good day to come back.

## 2016-10-20 ENCOUNTER — Telehealth: Payer: Self-pay | Admitting: Family Medicine

## 2016-10-28 ENCOUNTER — Ambulatory Visit (INDEPENDENT_AMBULATORY_CARE_PROVIDER_SITE_OTHER): Payer: Medicare Other | Admitting: Family Medicine

## 2016-10-28 ENCOUNTER — Encounter: Payer: Self-pay | Admitting: Family Medicine

## 2016-10-28 VITALS — BP 108/60 | HR 60 | Ht 69.0 in | Wt 210.6 lb

## 2016-10-28 DIAGNOSIS — F419 Anxiety disorder, unspecified: Secondary | ICD-10-CM | POA: Diagnosis not present

## 2016-10-28 DIAGNOSIS — Z0000 Encounter for general adult medical examination without abnormal findings: Secondary | ICD-10-CM

## 2016-10-28 DIAGNOSIS — I700 Atherosclerosis of aorta: Secondary | ICD-10-CM | POA: Diagnosis not present

## 2016-10-28 DIAGNOSIS — F17200 Nicotine dependence, unspecified, uncomplicated: Secondary | ICD-10-CM

## 2016-10-28 NOTE — Assessment & Plan Note (Signed)
Down to 5 cigarettes a day from 8-10. Encouraged complete cessation. Has follow up in July for lesion noted on lungs.

## 2016-10-28 NOTE — Assessment & Plan Note (Addendum)
S: noted on CT A/P: we discussed risks of this and that we would need to control lipids. Will get updated labs - to come back fasting Would have qualified for AAA screen but age 76 so past guaranteed medicare coverage timeline- patient does not want to proceed given uncertainy of coverage

## 2016-10-28 NOTE — Telephone Encounter (Signed)
Called to see if pt wanted to schedule awv - left message. ( has appt on 5/15 - come in at 9 for awv? )

## 2016-10-28 NOTE — Progress Notes (Signed)
Subjective:  Maryan RuedJack C Hocevar is a 76 y.o. year old very pleasant male patient who presents for/with See problem oriented charting ROS- denies recent anxiety. No chest pain. No edema.    Past Medical History-  Patient Active Problem List   Diagnosis Date Noted  . Anxiety     Priority: High  . Smoker     Priority: High  . Aortic atherosclerosis (HCC) 10/28/2016    Priority: Medium  . Neuropathy     Priority: Medium  . DDD (degenerative disc disease), lumbar     Priority: Medium  . Macular degeneration 07/21/2016    Priority: Low  . Genetic testing 10/14/2016  . Family history of colon cancer 10/02/2016  . Family history of breast cancer   . Hilar lymphadenopathy 09/23/2016  . Lymph nodes enlarged 09/23/2016  . Family history of genetic disease 08/28/2016    Medications- reviewed and updated Current Outpatient Prescriptions  Medication Sig Dispense Refill  . ALPRAZolam (XANAX) 0.5 MG tablet Take 0.5 mg by mouth 2 (two) times daily.    Marland Kitchen. aspirin EC 81 MG tablet Take 81 mg by mouth daily.    Marland Kitchen. gabapentin (NEURONTIN) 400 MG capsule TAKE 1 CAPSULE MIDDAY AND 2 CAPSULES AT BEDTIME.    Marland Kitchen. HYDROcodone-acetaminophen (NORCO/VICODIN) 5-325 MG tablet Take 1 tablet by mouth every 6 (six) hours as needed for moderate pain.    . meloxicam (MOBIC) 15 MG tablet TAKE 1 TABLET BY MOUTH EVERY DAY WITH EVENING MEAL 30 tablet 4  . NON FORMULARY     . sertraline (ZOLOFT) 100 MG tablet Take 1 tablet (100 mg total) by mouth daily. 90 tablet 1  . Multiple Vitamins-Minerals (PRESERVISION AREDS 2 PO) Take 1 tablet by mouth daily.     No current facility-administered medications for this visit.     Objective: BP 108/60   Pulse 60   Ht 5\' 9"  (1.753 m)   Wt 210 lb 9 oz (95.5 kg)   SpO2 97%   BMI 31.09 kg/m  Gen: NAD, resting comfortably CV: RRR no murmurs rubs or gallops Lungs: CTAB no crackles, wheeze, rhonchi Abdomen: soft/nontender/nondistended/normal bowel sounds.  Ext: no edema Skin: warm,  dry Neuro: able to get onto table without assist  Assessment/Plan:  Aortic atherosclerosis (HCC) S: noted on CT A/P: we discussed risks of this and that we would need to control lipids. Will get updated labs - to come back fasting Would have qualified for AAA screen but age 89 so past guaranteed medicare coverage timeline- patient does not want to proceed given uncertainy of coverage  Anxiety S: taking zoloft 100mg  daily. Still taking xanax 0.5mg  BID from Dr. Doristine CounterBurnett. History of anxiety though no issues lately A/P: we discussed trialing half a tablet of xanax twice a day. Follow up in 3 months to see how he is doing and see if we can go down further on meds  Stomach rumbling since starting on zoloft- even if hungry or not hungry. Will trial [place]activia for 2-3 weeks- if not better will go back to Dr. Loreta AveMann. He was told 5 more years until next colonoscopy recently.   Smoker Down to 5 cigarettes a day from 8-10. Encouraged complete cessation. Has follow up in July for lesion noted on lungs.    No Follow-up on file.  Orders Placed This Encounter  Procedures  . Lipid panel    Standing Status:   Future    Standing Expiration Date:   10/28/2017  . Comprehensive metabolic panel  [place]    Standing Status:   Future    Standing Expiration Date:   10/28/2017  . CBC with Differential/Platelet    Standing Status:   Future    Standing Expiration Date:   10/28/2017  . POCT Urinalysis Dipstick (Automated)    Standing Status:   Future    Standing Expiration Date:   10/28/2017    Meds ordered this encounter  Medications  . NON FORMULARY    Return precautions advised.  [name], MD

## 2016-10-28 NOTE — Assessment & Plan Note (Signed)
S: taking zoloft 100mg  daily. Still taking xanax 0.5mg  BID from Dr. Doristine CounterBurnett. History of anxiety though no issues lately A/P: we discussed trialing half a tablet of xanax twice a day. Follow up in 3 months to see how he is doing and see if we can go down further on meds  Stomach rumbling since starting on zoloft- even if hungry or not hungry. Will trial [place]ia for 2-3 weeks- if not better will go back to Dr. Loreta AveMann. He was told 5 more years until next colonoscopy recently.

## 2016-10-28 NOTE — Patient Instructions (Addendum)
Schedule a lab visit at the check out desk within 2 weeks. Return for future fasting labs meaning nothing but water after midnight please. Ok to take your medications with water.   Call [facility] with # highlighted below- and bring copy of records to next visit. Looks like you need the final pneumonia shot (pneumovax)  Continue zoloft.  we discussed trialing half a tablet of xanax twice a day. Follow up in 3 months to see how he is doing and see if we can go down further on meds  Trial activia yogurt- 1 cup a day for 3 weeks. If no better, return to see Dr. Loreta Ave about stomach rumbling. Sign release of information at the check out desk for records from last colonoscopy and office visit with her.   As always- encourage you to quit smoking   [name] , Thank you for taking time to come for your Medicare Wellness Visit. I appreciate your ongoing commitment to your health goals. Please review the following plan we discussed and let me know if I can assist you in the future.   A Tetanus is recommended every 10 years. Medicare covers a tetanus if you have a cut or wound; otherwise, there may be a charge. If you had not had a tetanus with pertusses, known as the Tdap, you can take this anytime.  Darl Pikes will call [facility] to get updateon Vaccines   Please call [facility] office to request an historical record of your vaccinations. The number you call is [phone]    These are the goals we discussed: Goals    . patient          Keep doing what you doing now; staying active         This is a list of the screening recommended for you and due dates:  Health Maintenance  Topic Date Due  . Tetanus Vaccine  [birth year]27/1959  . Pneumonia vaccines (1 of 2 - PCV13) 10/26/2005  . Flu Shot  01/14/2017    Health Maintenance, Male A healthy lifestyle and preventive care is important for your health and wellness. Ask your health care provider about what schedule of regular examinations is right for  you. What should I know about weight and diet?  Eat a Healthy Diet  Eat plenty of vegetables, fruits, whole grains, low-fat dairy products, and lean protein.  Do not eat a lot of foods high in solid fats, added sugars, or salt. Maintain a Healthy Weight  Regular exercise can help you achieve or maintain a healthy weight. You should:  Do at least 150 minutes of exercise each week. The exercise should increase your heart rate and make you sweat (moderate-intensity exercise).  Do strength-training exercises at least twice a week. Watch Your Levels of Cholesterol and Blood Lipids  Have your blood tested for lipids and cholesterol every 5 years starting at 76 years of age. If you are at high risk for heart disease, you should start having your blood tested when you are 76 years old. You may need to have your cholesterol levels checked more often if:  Your lipid or cholesterol levels are high.  You are older than 76 years of age.  You are at high risk for heart disease. What should I know about cancer screening? Many types of cancers can be detected early and may often be prevented. Lung Cancer  You should be screened every year for lung cancer if:  You are a current smoker who has  smoked for at least 30 years.  You are a former smoker who has quit within the past 15 years.  Talk to your health care provider about your screening options, when you should start screening, and how often you should be screened. Colorectal Cancer  Routine colorectal cancer screening usually begins at 76 years of age and should be repeated every 5-10 years until you are 76 years old. You may need to be screened more often if early forms of precancerous polyps or small growths are found. Your health care provider may recommend screening at an earlier age if you have risk factors for colon cancer.  Your health care provider may recommend using home test kits to check for hidden blood in the stool.  A small  camera at the end of a tube can be used to examine your colon (sigmoidoscopy or colonoscopy). This checks for the earliest forms of colorectal cancer. Prostate and Testicular Cancer  Depending on your age and overall health, your health care provider may do certain tests to screen for prostate and testicular cancer.  Talk to your health care provider about any symptoms or concerns you have about testicular or prostate cancer. Skin Cancer  Check your skin from head to toe regularly.  Tell your health care provider about any new moles or changes in moles, especially if:  There is a change in a mole's size, shape, or color.  You have a mole that is larger than a pencil eraser.  Always use sunscreen. Apply sunscreen liberally and repeat throughout the day.  Protect yourself by wearing long sleeves, pants, a wide-brimmed hat, and sunglasses when outside. What should I know about heart disease, diabetes, and high blood pressure?  If you are 4118-76 years of age, have your blood pressure checked every 3-5 years. If you are 76 years of age or older, have your blood pressure checked every year. You should have your blood pressure measured twice-once when you are at a hospital or clinic, and once when you are not at a hospital or clinic. Record the average of the two measurements. To check your blood pressure when you are not at a hospital or clinic, you can use:  An automated blood pressure machine at a pharmacy.  A home blood pressure monitor.  Talk to your health care provider about your target blood pressure.  If you are between 8945-76 years old, ask your health care provider if you should take aspirin to prevent heart disease.  Have regular diabetes screenings by checking your fasting blood sugar level.  If you are at a normal weight and have a low risk for diabetes, have this test once every three years after the age of 76.  If you are overweight and have a high risk for diabetes,  consider being tested at a younger age or more often.  A one-time screening for abdominal aortic aneurysm (AAA) by ultrasound is recommended for men aged 65-75 years who are current or former smokers. What should I know about preventing infection? Hepatitis B  If you have a higher risk for hepatitis B, you should be screened for this virus. Talk with your health care provider to find out if you are at risk for hepatitis B infection. Hepatitis C  Blood testing is recommended for:  Everyone born from [birth year] through 1965.  Anyone with known risk factors for hepatitis C. Sexually Transmitted Diseases (STDs)  You should be screened each year for STDs including gonorrhea and chlamydia if:  You are  sexually active and are younger than 76 years of age.  You are older than 76 years of age and your health care provider tells you that you are at risk for this type of infection.  Your sexual activity has changed since you were last screened and you are at an increased risk for chlamydia or gonorrhea. Ask your health care provider if you are at risk.  Talk with your health care provider about whether you are at high risk of being infected with HIV. Your health care provider may recommend a prescription medicine to help prevent HIV infection. What else can I do?  Schedule regular health, dental, and eye exams.  Stay current with your vaccines (immunizations).  Do not use any tobacco products, such as cigarettes, chewing tobacco, and e-cigarettes. If you need help quitting, ask your health care provider.  Limit alcohol intake to no more than 2 drinks per day. One drink equals 12 ounces of beer, 5 ounces of wine, or 1 ounces of hard liquor.  Do not use street drugs.  Do not share needles.  Ask your health care provider for help if you need support or information about quitting drugs.  Tell your health care provider if you often feel depressed.  Tell your health care provider if you have ever  been abused or do not feel safe at home. This information is not intended to replace advice given to you by your health care provider. Make sure you discuss any questions you have with your health care provider. Document Released: 11/29/2007 Document Revised: 01/30/2016 Document Reviewed: 03/06/2015 Elsevier Interactive Patient Education  2017 [employer].   Fall Prevention in the Home Falls can cause injuries and can affect people from all age groups. There are many simple things that you can do to make your home safe and to help prevent falls. What can I do on the outside of my home?  Regularly repair the edges of walkways and driveways and fix any cracks.  Remove high doorway thresholds.  Trim any shrubbery on the main path into your home.  Use bright outdoor lighting.  Clear walkways of debris and clutter, including tools and rocks.  Regularly check that handrails are securely fastened and in good repair. Both sides of any steps should have handrails.  Install guardrails along the edges of any raised decks or porches.  Have leaves, snow, and ice cleared regularly.  Use sand or salt on walkways during winter months.  In the garage, clean up any spills right away, including grease or oil spills. What can I do in the bathroom?  Use night lights.  Install grab bars by the toilet and in the tub and shower. Do not use towel bars as grab bars.  Use non-skid mats or decals on the floor of the tub or shower.  If you need to sit down while you are in the shower, use a plastic, non-slip stool.  Keep the floor dry. Immediately clean up any water that spills on the floor.  Remove soap buildup in the tub or shower on a regular basis.  Attach bath mats securely with double-sided non-slip rug tape.  Remove throw rugs and other tripping hazards from the floor. What can I do in the bedroom?  Use night lights.  Make sure that a bedside light is easy to reach.  Do not use  oversized bedding that drapes onto the floor.  Have a firm chair that has side arms to use for getting dressed.  Remove  throw rugs and other tripping hazards from the floor. What can I do in the kitchen?  Clean up any spills right away.  Avoid walking on wet floors.  Place frequently used items in easy-to-reach places.  If you need to reach for something above you, use a sturdy step stool that has a grab bar.  Keep electrical cables out of the way.  Do not use floor polish or wax that makes floors slippery. If you have to use wax, make sure that it is non-skid floor wax.  Remove throw rugs and other tripping hazards from the floor. What can I do in the stairways?  Do not leave any items on the stairs.  Make sure that there are handrails on both sides of the stairs. Fix handrails that are broken or loose. Make sure that handrails are as long as the stairways.  Check any carpeting to make sure that it is firmly attached to the stairs. Fix any carpet that is loose or worn.  Avoid having throw rugs at the top or bottom of stairways, or secure the rugs with carpet tape to prevent them from moving.  Make sure that you have a light switch at the top of the stairs and the bottom of the stairs. If you do not have them, have them installed. What are some other fall prevention tips?  Wear closed-toe shoes that fit well and support your feet. Wear shoes that have rubber soles or low heels.  When you use a stepladder, make sure that it is completely opened and that the sides are firmly locked. Have someone hold the ladder while you are using it. Do not climb a closed stepladder.  Add color or contrast paint or tape to grab bars and handrails in your home. Place contrasting color strips on the first and last steps.  Use mobility aids as needed, such as canes, walkers, scooters, and crutches.  Turn on lights if it is dark. Replace any light bulbs that burn out.  Set up furniture so that  there are clear paths. Keep the furniture in the same spot.  Fix any uneven floor surfaces.  Choose a carpet design that does not hide the edge of steps of a stairway.  Be aware of any and all pets.  Review your medicines with your healthcare provider. Some medicines can cause dizziness or changes in blood pressure, which increase your risk of falling. Talk with your health care provider about other ways that you can decrease your risk of falls. This may include working with a physical therapist or trainer to improve your strength, balance, and endurance. This information is not intended to replace advice given to you by your health care provider. Make sure you discuss any questions you have with your health care provider. Document Released: 05/23/2002 Document Revised: 10/30/2015 Document Reviewed: 07/07/2014 Elsevier Interactive Patient Education  2017 [employer].

## 2016-10-28 NOTE — Progress Notes (Signed)
Subjective:   Jonathon [name] is a 76 y.o. male who presents for Medicare Annual/Subsequent preventive examination.  HRA assessment completed during this visit with Jonathon [name]  The Patient was informed that the wellness visit is to identify future health risk and educate and initiate measures that can reduce risk for increased disease through the lifespan.    NO ROS; Medicare Wellness Visit Last OV:   Labs completed: do not see lipids Glucose 80 Family hx; breast cancer; dementia; ETOH abuse in Jonathon [name]; colon cancer in [name]  Describes health as fair, good or great? 75% What is the 25% ; age    Update:  Tobacco: Current every day smoker;  59 pack hx Lung cancer screening 08/2016  CT showed something on the bottom and went with CTA and will have another in 3 months    Do not see AAA screen/ but had CTA / Will defer to Jonathon [name] . Over age 3 so doesn't qualify for medicare AAA screen per Jonathon [name] 2nd Hand Smoke - no Chew no ETOH: drinks socially  Medications  BMI: 31; has gained over the years   Diet;   Breakfast; sometimes eat cinnamon roll; egg macmuffin Lunch; chicken; baked beans, cole slaw  Usually picks up salad at [employer];  Supper; has a snack; cookies; chips    Issues with teeth no issues   Exercise;  mow grass;  On his feet when working;  Not walking due to neuropathy of his feet OA of his back; wears a back brace and this does help  No falls   Safety features reviewed for safe community;  Split level home;  He know they will have issues with the steps in time Will continue to discuss with wife  Has thought about where he would move too;  Doesn't climb ladders; does not pick up heavy objects Service repair on heaters  Wife works in the yard all the time in flower garden   firearms if in the home;  smoke alarms;  sun protection when outside;  driving difficulties or accidents  Advanced Directive agreed to take form home Discussed his hx  and experience with AD   Hearing Screening Comments: Hears well but wife states he doesn't hear  [facility] follows [facility]  Vision Screening Comments: The eye doctor taking fish oil and 2 caps per day Was taking a vitamin but not now  The eye doctor     HOME SAFETY;  Fall hx; no Fall risk: yes due to neuropathy  Given education on "Fall Prevention in the Home" for more safety tips the patient can apply as appropriate.  Long term goal is to "age in place"  Is still in discussion  Mental Health:  Any emotional problems? Anxious, depressed, irritable, sad or blue? No Denies feeling depressed or hopeless; voices pleasure in daily life How many social activities have you been engaged in within the last 2 weeks? no Who would help you with chores; illness; shopping Jonathon?  Pain: back   Cognitive;  Manages checkbook, medications; no failures of task Ad8 score reviewed for issues;  Issues making decisions; no  Less interest in hobbies / activities" no  Repeats questions, stories; family complaining: NO  Trouble using ordinary gadgets; microwave; computer: no  Forgets the month or year: no  Mismanaging finances: no  Missing apt: no but does write them down  Daily problems with thinking of memory NO Ad8 score is 0    Advanced Directive addressed; Completed or educated  Health Maintenance due Tdap Colonoscopy due PCV 13 due    Immunizations Due: (Vaccines reviewed and educated regarding any overdue)     Patient Care Team: Marin Olp, MD as PCP - [facility] (Family Medicine) [facility]      Current Care Team reviewed and updated   Education provided and lifestyle risk discussed   All Health Maintenance Gaps Reviewed for closure         Objective:    Vitals: BP 108/60   Pulse 60   Ht 5' 9"  (1.753 m)   Wt 210 lb 9 oz (95.5 kg)   SpO2 97%   BMI 31.09 kg/m   Body mass index is 31.09 kg/m.  Tobacco History  Smoking Status  . Current  Every Day Smoker  . Packs/day: 1.00  . Years: 59.00  . Types: Cigarettes  Smokeless Tobacco  . Never Used    Comment: wants to continue to cut back     Ready to quit: Not Answered Counseling given: Yes   Past Medical History:  Diagnosis Date  . Anxiety    never tried on SSRI. xanax 0.85m BID through Dr. BTollie Pizza   . DDD (degenerative disc disease), lumbar    lumbar DDD- follows with orthopedist Jonathon [name]. mobic 16mdaily and hydrocodone 5/325 prn  . Family history of breast cancer   . Macular degeneration 07/21/2016   Follows with optho- areds2. He states he is unsure of diagnosis.   . Neuropathy    unknown cause ? back related(sees ortho for back). burning sensation in feet. gabapentin 10068mfter dinner, 2 before bed  . Smoker    8-10 cigarettes a day. plus nicorette. 60 years. over 50 pack years  . Venous stasis     in the past- Wound center on legs. Elevate. Compression stockings.    Past Surgical History:  Procedure Laterality Date  . ESOPHAGOGASTRODUODENOSCOPY Left 05/28/2016    Surgeon: Jonathon [name]; food stuck in esophagus   Family History  Problem Relation Age of Onset  . Jonathon Jonathon [name]        "old age" per patient   . Dementia Jonathon [name]   . Alcohol abuse Jonathon [name]        contributed  . Cancer Jonathon [name]        unknown type  . Alcohol abuse Jonathon [name]   . Breast cancer Jonathon [name]        dx in her early 30s65s Jonathon [name]        PALB2+  . Breast cancer Jonathon [name]   . Colon cancer Jonathon [name]   . Breast cancer Jonathon 39   History  Sexual Activity  . Sexual activity: Not on file    Outpatient Encounter Prescriptions as of 10/28/2016  Medication Sig  . ALPRAZolam (XANAX) 0.5 MG tablet Take 0.5 mg by mouth 2 (two) times daily.  . aMarland Kitchenpirin EC 81 MG tablet Take 81 mg by mouth daily.  . gMarland Kitchenbapentin (NEURONTIN) 400 MG capsule TAKE 1 CAPSULE MIDDAY AND 2 CAPSULES AT BEDTIME.  . HMarland KitchenDROcodone-acetaminophen (NORCO/VICODIN) 5-325 MG tablet Take 1 tablet by  mouth every 6 (six) hours as needed for moderate pain.  . meloxicam (MOBIC) 15 MG tablet TAKE 1 TABLET BY MOUTH EVERY DAY WITH EVENING MEAL  . NON FORMULARY   . sertraline (ZOLOFT) 100 MG tablet Take 1 tablet (100 mg total) by mouth daily.  . Multiple Vitamins-Minerals (PRESERVISION AREDS 2 PO) Take 1 tablet by mouth daily.   No facility-administered  encounter medications on file as of 10/28/2016.     Activities of Daily Living In your present state of health, do you have any difficulty performing the following activities: 10/28/2016  Hearing? N  Vision? Y  Difficulty concentrating or making decisions? N  Walking or climbing stairs? N  Dressing or bathing? N  Doing errands, shopping? N  Preparing Food and eating ? N  Using the Toilet? N  In the past six months, have you accidently leaked urine? N  Do you have problems with loss of bowel control? N  Managing your Medications? Y  Managing your Finances? N  Housekeeping or managing your Housekeeping? N  Some recent data might be hidden    Patient Care Team: Marin Olp, MD as PCP - General (Family Medicine)   Assessment:     Exercise Activities and Dietary recommendations Current Exercise Habits: Home exercise routine  Goals    . patient          Keep doing what you doing now; staying active        Fall Risk Fall Risk  10/28/2016 07/21/2016  Falls in the past year? No No   Depression Screen PHQ 2/9 Scores 10/28/2016 07/21/2016  PHQ - 2 Score 0 0    Cognitive Function still very functional continues to work      6CIT Screen 10/28/2016  What Year? 0 points  What month? 0 points  What time? 0 points  Count back from 20 0 points  Months in reverse 0 points  Repeat phrase 6 points  Total Score 6    Immunization History  Administered Date(s) Administered  . Influenza-Unspecified 05/18/2015, 03/18/2016, 04/30/2016  . Pneumococcal Conjugate-13 01/31/2014  . Zoster 07/06/2013   Screening Tests Health  Maintenance  Topic Date Due  . TETANUS/TDAP  10/27/1959  . PNA vac Low Risk Adult (2 of 2 - PPSV23) 02/01/2015  . INFLUENZA VACCINE  01/14/2017      Plan:      PCP Notes  Health Maintenance [facility] pneumonia [facility]  Has not had tetanus and [facility]    Abnormal Screens   Referrals   Patient concerns;  Nurse Concerns;  Next PCP apt        I have personally reviewed and noted the following in the patient's chart:   . Medical and social history . Use of alcohol, tobacco or illicit drugs  . Current medications and supplements . Functional ability and status . Nutritional status . Physical activity . Advanced directives . List of Jonathon physicians . Hospitalizations, surgeries, and ER visits in previous 12 months . Vitals . Screenings to include cognitive, depression, and falls . Referrals and appointments  In addition, I have reviewed and discussed with patient certain preventive protocols, quality metrics, and best practice recommendations. A written personalized care plan for preventive services as well as general preventive health recommendations were provided to patient.   HOOIL,NZVJK, RN  10/28/2016  I have reviewed and agree with note, evaluation, plan. Pulled in records for immunizations. [facility]. Update next visit as needed. If no information on records by follow up will give pneuovax and print tdap for him.   Garret Reddish, MD

## 2016-11-11 ENCOUNTER — Other Ambulatory Visit (INDEPENDENT_AMBULATORY_CARE_PROVIDER_SITE_OTHER): Payer: Medicare Other

## 2016-11-11 DIAGNOSIS — I700 Atherosclerosis of aorta: Secondary | ICD-10-CM | POA: Diagnosis not present

## 2016-11-11 DIAGNOSIS — F17200 Nicotine dependence, unspecified, uncomplicated: Secondary | ICD-10-CM

## 2016-11-11 LAB — LIPID PANEL
Cholesterol: 211 mg/dL — ABNORMAL HIGH (ref 0–200)
HDL: 44.5 mg/dL (ref >39.00)
LDL Cholesterol: 143 mg/dL — ABNORMAL HIGH (ref 0–99)
NonHDL: 166.54
Total CHOL/HDL Ratio: 5
Triglycerides: 118 mg/dL (ref 0.0–149.0)
VLDL: 23.6 mg/dL (ref 0.0–40.0)

## 2016-11-11 LAB — POC URINALSYSI DIPSTICK (AUTOMATED)
Bilirubin, UA: NEGATIVE
GLUCOSE UA: NEGATIVE
Ketones, UA: NEGATIVE
Leukocytes, UA: NEGATIVE
NITRITE UA: NEGATIVE
PROTEIN UA: NEGATIVE
RBC UA: NEGATIVE
SPEC GRAV UA: 1.02 (ref 1.010–1.025)
UROBILINOGEN UA: 0.2 U/dL
pH, UA: 6 (ref 5.0–8.0)

## 2016-11-11 LAB — CBC WITH DIFFERENTIAL/PLATELET
BASOS ABS: 0.1 10*3/uL (ref 0.0–0.1)
Basophils Relative: 1.1 % (ref 0.0–3.0)
EOS ABS: 0.4 10*3/uL (ref 0.0–0.7)
Eosinophils Relative: 8.6 % — ABNORMAL HIGH (ref 0.0–5.0)
HEMATOCRIT: 42.9 % (ref 39.0–52.0)
HEMOGLOBIN: 14.4 g/dL (ref 13.0–17.0)
LYMPHS PCT: 24.2 % (ref 12.0–46.0)
Lymphs Abs: 1.1 10*3/uL (ref 0.7–4.0)
MCHC: 33.6 g/dL (ref 30.0–36.0)
MCV: 93.7 fl (ref 78.0–100.0)
MONO ABS: 0.4 10*3/uL (ref 0.1–1.0)
Monocytes Relative: 8.9 % (ref 3.0–12.0)
Neutro Abs: 2.6 10*3/uL (ref 1.4–7.7)
Neutrophils Relative %: 57.2 % (ref 43.0–77.0)
Platelets: 233 10*3/uL (ref 150.0–400.0)
RBC: 4.58 Mil/uL (ref 4.22–5.81)
RDW: 13.8 % (ref 11.5–15.5)
WBC: 4.6 10*3/uL (ref 4.0–10.5)

## 2016-11-11 LAB — COMPREHENSIVE METABOLIC PANEL
ALK PHOS: 59 U/L (ref 39–117)
ALT: 11 U/L (ref 0–53)
AST: 13 U/L (ref 0–37)
Albumin: 4.1 g/dL (ref 3.5–5.2)
BILIRUBIN TOTAL: 0.8 mg/dL (ref 0.2–1.2)
BUN: 19 mg/dL (ref 6–23)
CALCIUM: 9.5 mg/dL (ref 8.4–10.5)
CO2: 28 mEq/L (ref 19–32)
CREATININE: 1.07 mg/dL (ref 0.40–1.50)
Chloride: 104 mEq/L (ref 96–112)
GFR: 71.41 mL/min (ref >60.00)
Glucose, Bld: 98 mg/dL (ref 70–99)
Potassium: 4.3 mEq/L (ref 3.5–5.1)
SODIUM: 140 meq/L (ref 135–145)
TOTAL PROTEIN: 6.9 g/dL (ref 6.0–8.3)

## 2016-11-12 ENCOUNTER — Other Ambulatory Visit: Payer: Self-pay

## 2016-11-12 MED ORDER — ATORVASTATIN CALCIUM 20 MG PO TABS: 20.0000 mg | ORAL_TABLET | Freq: Every day | ORAL | 5 refills | Status: DC

## 2016-12-23 ENCOUNTER — Ambulatory Visit (INDEPENDENT_AMBULATORY_CARE_PROVIDER_SITE_OTHER)
Admission: RE | Admit: 2016-12-23 | Discharge: 2016-12-23 | Disposition: A | Discharge status: Home / Self Care | Payer: Medicare Other | Source: Ambulatory Visit | Attending: Pulmonary Disease | Admitting: Pulmonary Disease

## 2016-12-23 ENCOUNTER — Other Ambulatory Visit (INDEPENDENT_AMBULATORY_CARE_PROVIDER_SITE_OTHER): Payer: Self-pay | Admitting: Radiology

## 2016-12-23 DIAGNOSIS — R590 Localized enlarged lymph nodes: Secondary | ICD-10-CM | POA: Diagnosis not present

## 2016-12-23 DIAGNOSIS — I700 Atherosclerosis of aorta: Secondary | ICD-10-CM | POA: Diagnosis not present

## 2016-12-23 MED ORDER — MELOXICAM 15 MG PO TABS: 15.0000 mg | ORAL_TABLET | Freq: Every day | ORAL | 3 refills | Status: DC

## 2016-12-23 MED ORDER — IOPAMIDOL (ISOVUE-300) INJECTION 61%
80.0000 mL | Freq: Once | INTRAVENOUS | Status: AC | PRN
  Administered 2016-12-23: 80 mL via INTRAVENOUS

## 2016-12-26 ENCOUNTER — Other Ambulatory Visit: Payer: Self-pay

## 2016-12-26 ENCOUNTER — Ambulatory Visit: Payer: Medicare Other | Admitting: Pulmonary Disease

## 2016-12-26 ENCOUNTER — Encounter: Payer: Self-pay | Admitting: Pulmonary Disease

## 2016-12-26 ENCOUNTER — Ambulatory Visit (INDEPENDENT_AMBULATORY_CARE_PROVIDER_SITE_OTHER): Payer: Medicare Other | Admitting: Pulmonary Disease

## 2016-12-26 VITALS — BP 114/72 | HR 64 | Ht 69.0 in | Wt 213.0 lb

## 2016-12-26 DIAGNOSIS — R590 Localized enlarged lymph nodes: Secondary | ICD-10-CM

## 2016-12-26 DIAGNOSIS — R918 Other nonspecific abnormal finding of lung field: Secondary | ICD-10-CM | POA: Diagnosis not present

## 2016-12-26 NOTE — Patient Instructions (Signed)
CT chest without contrast in 1 year 

## 2016-12-26 NOTE — Progress Notes (Signed)
   Subjective:    Patient ID: Jonathon [name], male    DOB: [birth year], 76 y.o.   MRN: [id number]  HPI  76 year old smoker referred for abnormal screening CAT scan He started smoking as a 76 year old, more than 50 pack years -He continues to smoke about half pack per day   We reviewed his CT scan - lung nodules are either stable or decreased in size, most concerning was pleural based nodule in the periphery of right lower lobe -but even this is marginally decreased. There were calcified granulomas in the liver and spleen Right hilar lymphadenopathy stable but at 9 mm and left hilar lymph nodes appear calcified  Significant tests/ events reviewed  Screening CT chest on 09/10/16 >>scattered pulmonary nodules measuring up to 16 mm the subpleural right lower lobe,  another 8 mm subpleural nodule in the superior  segment left lower lobe- RADS  4 B. PET scan -minimal metabolic activity in the right subpleural nodule indicating benign etiology.  hypermetabolic right hilar lymph node-this lymph node was not noted on the earlier noncontrast CT. There was diffuse activity throughout the esophagus favoring esophagitis  Spirometry- no  evidence of airway obstruction with a ratio of 99 an FEV1 of 72% and FVC of 52% suggesting moderate restriction  Review of Systems Patient denies significant dyspnea,cough, hemoptysis,  chest pain, palpitations, pedal edema, orthopnea, paroxysmal nocturnal dyspnea, lightheadedness, nausea, vomiting, abdominal or  leg pains      Objective:   Physical Exam  Gen. Pleasant, well-nourished, in no distress ENT - no thrush, no post nasal drip Neck: No JVD, no thyromegaly, no carotid bruits Lungs: no use of accessory muscles, no dullness to percussion, clear without rales or rhonchi  Cardiovascular: Rhythm regular, heart sounds  normal, no murmurs or gallops, no peripheral edema Musculoskeletal: No deformities, no cyanosis or clubbing        Assessment & Plan:

## 2016-12-26 NOTE — Assessment & Plan Note (Signed)
Favor benign etiology but will need one-year follow-up The right lower lobe nodule may have marginally decreased in size He does seem to have old grandmother's disease otherwise with calcification in his mediastinal lymph nodes and liver and spleen

## 2016-12-26 NOTE — Assessment & Plan Note (Signed)
Left calcified, right hilar stable- favor old granulomatous disease

## 2016-12-30 ENCOUNTER — Other Ambulatory Visit: Payer: Self-pay | Admitting: Acute Care

## 2016-12-30 DIAGNOSIS — F17210 Nicotine dependence, cigarettes, uncomplicated: Secondary | ICD-10-CM

## 2017-01-28 ENCOUNTER — Ambulatory Visit (INDEPENDENT_AMBULATORY_CARE_PROVIDER_SITE_OTHER): Payer: Medicare Other | Admitting: Family Medicine

## 2017-01-28 ENCOUNTER — Encounter: Payer: Self-pay | Admitting: Family Medicine

## 2017-01-28 VITALS — BP 98/58 | HR 67 | Temp 98.2°F | Ht 69.0 in | Wt 212.4 lb

## 2017-01-28 DIAGNOSIS — F17200 Nicotine dependence, unspecified, uncomplicated: Secondary | ICD-10-CM

## 2017-01-28 DIAGNOSIS — F419 Anxiety disorder, unspecified: Secondary | ICD-10-CM

## 2017-01-28 DIAGNOSIS — E785 Hyperlipidemia, unspecified: Secondary | ICD-10-CM | POA: Diagnosis not present

## 2017-01-28 LAB — COMPREHENSIVE METABOLIC PANEL
ALBUMIN: 4 g/dL (ref 3.5–5.2)
ALT: 12 U/L (ref 0–53)
AST: 16 U/L (ref 0–37)
Alkaline Phosphatase: 66 U/L (ref 39–117)
BUN: 18 mg/dL (ref 6–23)
CO2: 29 mEq/L (ref 19–32)
Calcium: 9 mg/dL (ref 8.4–10.5)
Chloride: 104 mEq/L (ref 96–112)
Creatinine, Ser: 1.05 mg/dL (ref 0.40–1.50)
GFR: 72.94 mL/min (ref >60.00)
GLUCOSE: 105 mg/dL — AB (ref 70–99)
POTASSIUM: 3.9 meq/L (ref 3.5–5.1)
SODIUM: 139 meq/L (ref 135–145)
TOTAL PROTEIN: 6.1 g/dL (ref 6.0–8.3)
Total Bilirubin: 0.7 mg/dL (ref 0.2–1.2)

## 2017-01-28 LAB — LDL CHOLESTEROL, DIRECT: LDL DIRECT: 59 mg/dL

## 2017-01-28 MED ORDER — SERTRALINE HCL 100 MG PO TABS: 150.0000 mg | ORAL_TABLET | Freq: Every day | ORAL | 1 refills | Status: DC

## 2017-01-28 MED ORDER — ALPRAZOLAM 0.5 MG PO TABS: 0.5000 mg | ORAL_TABLET | Freq: Two times a day (BID) | ORAL | 3 refills | Status: DC

## 2017-01-28 NOTE — Progress Notes (Signed)
Subjective:  Jonathon [name] is a 76 y.o. year old very pleasant male patient who presents for/with See problem oriented charting ROS- states panicked periods and didn't feel like talking when on lower dose of xanax twice a week within a week, no chest pain, no worsening edmea. No fever   Past Medical History-  Patient Active Problem List   Diagnosis Date Noted  . Anxiety     Priority: High  . Smoker     Priority: High  . Aortic atherosclerosis (HCC) 10/28/2016    Priority: Medium  . Neuropathy     Priority: Medium  . DDD (degenerative disc disease), lumbar     Priority: Medium  . Macular degeneration 07/21/2016    Priority: Low  . Hyperlipidemia 01/28/2017  . Pulmonary nodules 12/26/2016  . Genetic testing 10/14/2016  . Family history of colon cancer 10/02/2016  . Family history of breast cancer   . Hilar lymphadenopathy 09/23/2016  . Lymph nodes enlarged 09/23/2016  . Family history of genetic disease 08/28/2016    Medications- reviewed and updated Current Outpatient Prescriptions  Medication Sig Dispense Refill  . ALPRAZolam (XANAX) 0.5 MG tablet Take 1 tablet (0.5 mg total) by mouth 2 (two) times daily. 60 tablet 3  . aspirin EC 81 MG tablet Take 81 mg by mouth daily.    Marland Kitchen atorvastatin (LIPITOR) 20 MG tablet Take 1 tablet (20 mg total) by mouth daily. 30 tablet 5  . gabapentin (NEURONTIN) 400 MG capsule TAKE 1 CAPSULE MIDDAY AND 2 CAPSULES AT BEDTIME.    Marland Kitchen HYDROcodone-acetaminophen (NORCO/VICODIN) 5-325 MG tablet Take 1 tablet by mouth every 6 (six) hours as needed for moderate pain.    . meloxicam (MOBIC) 15 MG tablet Take 1 tablet (15 mg total) by mouth daily. 90 tablet 3  . Multiple Vitamins-Minerals (PRESERVISION AREDS 2 PO) Take 1 tablet by mouth daily.    . NON FORMULARY     . sertraline (ZOLOFT) 100 MG tablet Take 1.5 tablets (150 mg total) by mouth daily. 135 tablet 1   No current facility-administered medications for this visit.     Objective: BP (!) 98/58  (BP Location: Left Arm, Patient Position: Sitting, Cuff Size: Large)   Pulse 67   Temp 98.2 F (36.8 C) (Oral)   Ht 5\' 9"  (1.753 m)   Wt 212 lb 6.4 oz (96.3 kg)   SpO2 95%   BMI 31.37 kg/m  Gen: NAD, resting comfortably, smells of smoke CV: RRR no murmurs rubs or gallops Lungs: CTAB no crackles, wheeze, rhonchi Abdomen: soft/nontender/nondistended/normal bowel sounds. No rebound or guarding.  Ext: no edema under compression stockings Skin: warm, dry  Assessment/Plan:  Anxiety S: taking zoloft 100mg  daily. Had davised trial of halfl of 0.5mg  xanax BID instead of full tablet BID. He states had a panic attack twice in a week period so went back to full dose. No SI.   Was having some stomach rumbling on zoloft- even if hungry or not. We advised activia trial and follow up with Dr. Loreta Ave if not improved- states did not resolve. Very sporadic so has not seen Dr. Loreta Ave A/P: titrate zoloft to 150mg . Continue xanax 0.5mg  twice a day. Follow up 3 months. 2 weeks before he comes back I want him to trial going down to half xanax 0.5mg  twice a day  Smoker S: last visit at 5 cigarettes a day , back up to about 10 a day (half pack).  A/P: strongly advised cessation- not ready to quit. Stressed most  important thing he can do for his health  Hyperlipidemia S: poorly controlled on no statin last check- started on atorvastatin 20mg . No myalgias.  Lab Results  Component Value Date   CHOL 211 (H) 11/11/2016   HDL 44.50 11/11/2016   LDLCALC 143 (H) 11/11/2016   TRIG 118.0 11/11/2016   CHOLHDL 5 11/11/2016   A/P: update ldl and cmet  3 month  Orders Placed This Encounter  Procedures  . Comprehensive metabolic panel    [place]  . LDL cholesterol, direct    [place]    Meds ordered this encounter  Medications  . sertraline (ZOLOFT) 100 MG tablet    Sig: Take 1.5 tablets (150 mg total) by mouth daily.    Dispense:  135 tablet    Refill:  1  . ALPRAZolam (XANAX) 0.5 MG tablet    Sig: Take 1  tablet (0.5 mg total) by mouth 2 (two) times daily.    Dispense:  60 tablet    Refill:  3   Return precautions advised.  Tana ConchStephen Krysia Zahradnik, MD

## 2017-01-28 NOTE — Assessment & Plan Note (Signed)
S: last visit at 5 cigarettes a day , back up to about 10 a day (half pack).  A/P: strongly advised cessation- not ready to quit. Stressed most important thing he can do for his health

## 2017-01-28 NOTE — Assessment & Plan Note (Signed)
S: taking zoloft 100mg  daily. Had davised trial of halfl of 0.5mg  xanax BID instead of full tablet BID. He states had a panic attack twice in a week period so went back to full dose. No SI.   Was having some stomach rumbling on zoloft- even if hungry or not. We advised activia trial and follow up with Dr. Loreta AveMann if not improved- states did not resolve. Very sporadic so has not seen Dr. Loreta AveMann A/P: titrate zoloft to 150mg . Continue xanax 0.5mg  twice a day. Follow up 3 months. 2 weeks before he comes back I want him to trial going down to half xanax 0.5mg  twice a day

## 2017-01-28 NOTE — Assessment & Plan Note (Signed)
S: poorly controlled on no statin last check- started on atorvastatin 20mg . No myalgias.  Lab Results  Component Value Date   CHOL 211 (H) 11/11/2016   HDL 44.50 11/11/2016   LDLCALC 143 (H) 11/11/2016   TRIG 118.0 11/11/2016   CHOLHDL 5 11/11/2016   A/P: update ldl and cmet

## 2017-01-28 NOTE — Patient Instructions (Signed)
Increase zoloft to 150mg . Continue xanax 0.5mg  twice a day. Follow up 3 months. 2 weeks before he comes back I want him to trial going down to half xanax 0.5mg  twice a day

## 2017-02-10 DIAGNOSIS — R140 Abdominal distension (gaseous): Secondary | ICD-10-CM | POA: Diagnosis not present

## 2017-02-10 DIAGNOSIS — K5901 Slow transit constipation: Secondary | ICD-10-CM | POA: Diagnosis not present

## 2017-02-10 DIAGNOSIS — K219 Gastro-esophageal reflux disease without esophagitis: Secondary | ICD-10-CM | POA: Diagnosis not present

## 2017-03-19 DIAGNOSIS — H353132 Nonexudative age-related macular degeneration, bilateral, intermediate dry stage: Secondary | ICD-10-CM | POA: Diagnosis not present

## 2017-03-19 DIAGNOSIS — H25013 Cortical age-related cataract, bilateral: Secondary | ICD-10-CM | POA: Diagnosis not present

## 2017-03-19 DIAGNOSIS — H35033 Hypertensive retinopathy, bilateral: Secondary | ICD-10-CM | POA: Diagnosis not present

## 2017-03-19 DIAGNOSIS — H2513 Age-related nuclear cataract, bilateral: Secondary | ICD-10-CM | POA: Diagnosis not present

## 2017-03-24 ENCOUNTER — Other Ambulatory Visit: Payer: Self-pay | Admitting: *Deleted

## 2017-03-24 MED ORDER — ATORVASTATIN CALCIUM 20 MG PO TABS: 20.0000 mg | ORAL_TABLET | Freq: Every day | ORAL | 0 refills | Status: DC

## 2017-03-25 ENCOUNTER — Encounter: Payer: Self-pay | Admitting: Family Medicine

## 2017-03-26 ENCOUNTER — Ambulatory Visit (INDEPENDENT_AMBULATORY_CARE_PROVIDER_SITE_OTHER): Payer: Medicare Other

## 2017-03-26 DIAGNOSIS — Z23 Encounter for immunization: Secondary | ICD-10-CM | POA: Diagnosis not present

## 2017-03-27 ENCOUNTER — Ambulatory Visit: Payer: Medicare Other | Admitting: Pulmonary Disease

## 2017-04-27 ENCOUNTER — Other Ambulatory Visit: Payer: Self-pay

## 2017-04-27 MED ORDER — SERTRALINE HCL 100 MG PO TABS: 150.0000 mg | ORAL_TABLET | Freq: Every day | ORAL | 1 refills | Status: DC

## 2017-04-30 ENCOUNTER — Encounter: Payer: Self-pay | Admitting: Family Medicine

## 2017-04-30 ENCOUNTER — Ambulatory Visit (INDEPENDENT_AMBULATORY_CARE_PROVIDER_SITE_OTHER): Payer: Medicare Other | Admitting: Family Medicine

## 2017-04-30 DIAGNOSIS — F419 Anxiety disorder, unspecified: Secondary | ICD-10-CM | POA: Diagnosis not present

## 2017-04-30 DIAGNOSIS — F17200 Nicotine dependence, unspecified, uncomplicated: Secondary | ICD-10-CM

## 2017-04-30 DIAGNOSIS — E785 Hyperlipidemia, unspecified: Secondary | ICD-10-CM | POA: Diagnosis not present

## 2017-04-30 NOTE — Assessment & Plan Note (Signed)
S: last visit we titrated zoloft to 150mg  due to increased anxiety when trialing half of 0.5mg  xanax BID. I had asked him to trial the half tablet again 2 weeks before this visit.   He just started the higher zoloft dose 2 days ago because prescription just came in. Using 1 xanax in the morning and half tablet most days later in the day.   A/P:GAD7 of 3 today- well controlled. Hopefully can further reduce xanax dose to half tab BID by next visit as titrates up to 150mg  zoloft for full 6 weeks.

## 2017-04-30 NOTE — Assessment & Plan Note (Signed)
S: 10 cigarettes per day last visit. Down to 5-8 cigarettes per day. Sports bars are a big trigger for him- wants to avoid A/P: strongly encouraged complete cessation by next visit- patient seems unsure of this chance but does want to cut back

## 2017-04-30 NOTE — Progress Notes (Signed)
Subjective:  Jonathon [name] is a 76 y.o. year old very pleasant male patient who presents for/with See problem oriented charting ROS- admits to some anxiety but much improved. No edema. No chest pain. Some cough  Past Medical History-  Patient Active Problem List   Diagnosis Date Noted  . Anxiety     Priority: High  . Smoker     Priority: High  . Hyperlipidemia 01/28/2017    Priority: Medium  . Pulmonary nodules 12/26/2016    Priority: Medium  . Aortic atherosclerosis (HCC) 10/28/2016    Priority: Medium  . Neuropathy     Priority: Medium  . DDD (degenerative disc disease), lumbar     Priority: Medium  . Genetic testing 10/14/2016    Priority: Low  . Family history of colon cancer 10/02/2016    Priority: Low  . Family history of breast cancer     Priority: Low  . Hilar lymphadenopathy 09/23/2016    Priority: Low  . Lymph nodes enlarged 09/23/2016    Priority: Low  . Family history of genetic disease 08/28/2016    Priority: Low  . Macular degeneration 07/21/2016    Priority: Low   Medications- reviewed and updated Current Outpatient Medications  Medication Sig Dispense Refill  . ALPRAZolam (XANAX) 0.5 MG tablet Take 1 tablet (0.5 mg total) by mouth 2 (two) times daily. 60 tablet 3  . aspirin EC 81 MG tablet Take 81 mg by mouth daily.    Marland Kitchen. atorvastatin (LIPITOR) 20 MG tablet Take 1 tablet (20 mg total) by mouth daily. 90 tablet 0  . gabapentin (NEURONTIN) 400 MG capsule TAKE 1 CAPSULE MIDDAY AND 2 CAPSULES AT BEDTIME.    Marland Kitchen. HYDROcodone-acetaminophen (NORCO/VICODIN) 5-325 MG tablet Take 1 tablet by mouth every 6 (six) hours as needed for moderate pain.    . meloxicam (MOBIC) 15 MG tablet Take 1 tablet (15 mg total) by mouth daily. 90 tablet 3  . Multiple Vitamins-Minerals (PRESERVISION AREDS 2 PO) Take 1 tablet by mouth daily.    . NON FORMULARY     . sertraline (ZOLOFT) 100 MG tablet Take 1.5 tablets (150 mg total) daily by mouth. 135 tablet 1   No current  facility-administered medications for this visit.     Objective: BP 102/70 (BP Location: Left Arm, Patient Position: Sitting, Cuff Size: Large)   Pulse 62   Temp 97.9 F (36.6 C) (Oral)   Ht 5\' 9"  (1.753 m)   Wt 211 lb 12.8 oz (96.1 kg)   SpO2 96%   BMI 31.28 kg/m  Gen: NAD, resting comfortably, smells of smoke CV: RRR no murmurs rubs or gallops Lungs: CTAB no crackles, wheeze, rhonchi Abdomen: soft/nontender/nondistended/normal bowel sounds. overweight Ext: no edema Skin: warm, dry, no rash  Assessment/Plan:  Anxiety S: last visit we titrated zoloft to 150mg  due to increased anxiety when trialing half of 0.5mg  xanax BID. I had asked him to trial the half tablet again 2 weeks before this visit.   He just started the higher zoloft dose 2 days ago because prescription just came in. Using 1 xanax in the morning and half tablet most days later in the day.   A/P:GAD7 of 3 today- well controlled. Hopefully can further reduce xanax dose to half tab BID by next visit as titrates up to 150mg  zoloft for full 6 weeks.   Smoker S: 10 cigarettes per day last visit. Down to 5-8 cigarettes per day. Sports bars are a big trigger for him- wants to avoid A/P:  strongly encouraged complete cessation by next visit- patient seems unsure of this chance but does want to cut back  Hyperlipidemia S: well controlled on last check on atorvastatin 20mg  with LDL from 143 to 59. No myalgias.  A/P: continue current medication   Future Appointments  Date Time Provider Department Center  07/31/2017  8:30 AM Shelva MajesticHunter, Stephen O, MD [facility] None  12/28/2017  9:00 AM Parrett, Virgel Bouquetammy S, NP [facility] None   Return precautions advised.  Tana ConchStephen Hunter, MD

## 2017-04-30 NOTE — Patient Instructions (Signed)
Follow up in 3 months  Great job cutting down on xanax about 25%- lets see if we can get down another 25% now on higher dose zoloft- cut down in about 4 weeks  Great job trying to cut down on smoking- want to see you all the way off by next visit  Cholesterol looked great

## 2017-04-30 NOTE — Assessment & Plan Note (Signed)
S: well controlled on last check on atorvastatin 20mg  with LDL from 143 to 59. No myalgias.  A/P: continue current medication

## 2017-06-19 ENCOUNTER — Other Ambulatory Visit: Payer: Self-pay

## 2017-06-19 MED ORDER — ATORVASTATIN CALCIUM 20 MG PO TABS: 20.0000 mg | ORAL_TABLET | Freq: Every day | ORAL | 1 refills | Status: DC

## 2017-07-09 ENCOUNTER — Telehealth: Payer: Self-pay | Admitting: Family Medicine

## 2017-07-09 NOTE — Telephone Encounter (Signed)
MEDICATION: alprazolam Prudy Feeler(XANAX) .5MG  tablet  PHARMACY:  [facility]  [address] [place] [address]  IS THIS A 90 DAY SUPPLY : Yes  IS PATIENT OUT OF MEDICATION: yes  IF NOT; HOW MUCH IS LEFT: none  LAST APPOINTMENT DATE: @1 /09/2017  NEXT APPOINTMENT DATE:@2 /15/2019  OTHER COMMENTS: Patient states this medication cannot be called in, and that he must pick it up here.    **Let patient know to contact pharmacy at the end of the day to make sure medication is ready. **  ** Please notify patient to allow 48-72 hours to process**  **Encourage patient to contact the pharmacy for refills or they can [facility]**

## 2017-07-10 ENCOUNTER — Other Ambulatory Visit: Payer: Self-pay

## 2017-07-10 MED ORDER — ALPRAZOLAM 0.5 MG PO TABS: 0.5000 mg | ORAL_TABLET | Freq: Two times a day (BID) | ORAL | 3 refills | Status: DC

## 2017-07-10 NOTE — Telephone Encounter (Signed)
Called patient and he is aware. 

## 2017-07-10 NOTE — Telephone Encounter (Signed)
Patient came into the office inquiring about his medication refill below. I spoke with Asher MuirJamie who advised that this would be filled by the end of the day. Patient states that he has to come pick up the medication from the office. Please contact patient once the medication is ready for pick up preferably by the end of the day.

## 2017-07-10 NOTE — Telephone Encounter (Signed)
Prescription faxed to pharmacy as requested. 

## 2017-07-16 ENCOUNTER — Telehealth: Payer: Self-pay

## 2017-07-16 NOTE — Telephone Encounter (Signed)
Call to schedule AWV and it was completed in May 2018 Will be due in June 2019

## 2017-07-31 ENCOUNTER — Encounter: Payer: Self-pay | Admitting: Family Medicine

## 2017-07-31 ENCOUNTER — Ambulatory Visit (INDEPENDENT_AMBULATORY_CARE_PROVIDER_SITE_OTHER): Payer: Medicare Other | Admitting: Family Medicine

## 2017-07-31 VITALS — BP 116/72 | HR 65 | Temp 97.4°F | Ht 69.0 in | Wt 212.8 lb

## 2017-07-31 DIAGNOSIS — F17210 Nicotine dependence, cigarettes, uncomplicated: Secondary | ICD-10-CM | POA: Diagnosis not present

## 2017-07-31 DIAGNOSIS — F419 Anxiety disorder, unspecified: Secondary | ICD-10-CM | POA: Diagnosis not present

## 2017-07-31 DIAGNOSIS — E785 Hyperlipidemia, unspecified: Secondary | ICD-10-CM | POA: Diagnosis not present

## 2017-07-31 DIAGNOSIS — L298 Other pruritus: Secondary | ICD-10-CM

## 2017-07-31 DIAGNOSIS — B356 Tinea cruris: Secondary | ICD-10-CM

## 2017-07-31 DIAGNOSIS — M5136 Other intervertebral disc degeneration, lumbar region: Secondary | ICD-10-CM | POA: Diagnosis not present

## 2017-07-31 LAB — BASIC METABOLIC PANEL
BUN: 18 mg/dL (ref 6–23)
CALCIUM: 9.2 mg/dL (ref 8.4–10.5)
CO2: 30 mEq/L (ref 19–32)
Chloride: 102 mEq/L (ref 96–112)
Creatinine, Ser: 0.98 mg/dL (ref 0.40–1.50)
GFR: 78.88 mL/min (ref >60.00)
Glucose, Bld: 79 mg/dL (ref 70–99)
Potassium: 4.1 mEq/L (ref 3.5–5.1)
SODIUM: 139 meq/L (ref 135–145)

## 2017-07-31 MED ORDER — KETOCONAZOLE 2 % EX CREA: 1.0000 "application " | TOPICAL_CREAM | Freq: Every day | CUTANEOUS | 2 refills | Status: DC

## 2017-07-31 NOTE — Assessment & Plan Note (Signed)
Ketoconazole prn written. He had been on betamethasone/clotrimazole- I advised trial ketoconazole alone. He also used it on his face for rashes- I advised hydrocortisone 1% alone up to 7 days max and see us if not improving

## 2017-07-31 NOTE — Assessment & Plan Note (Signed)
S: follows with orthopedist Dr. Leron CroakNitka [facility]. mobic 15mg  daily and hydrocodone 5/325 prn. Also on gabapentin for neuropathy.  A/P: with him regularly using mobic- will update bmet

## 2017-07-31 NOTE — Patient Instructions (Addendum)
Please stop by lab before you go  [place]iGreat job on the xanax! Now down to 1 full tablet per day. Lets have you try 1/2 tablet one day then 1 the next and alternate back and forth between these  Strongly encourage you to quit smoking. I would try a low dose patch like 7 mg patch (which is equal to 7 cigarettes a day) to see if you can get off the smoking.   Can use hydrocortisone 1% over the counter up to 7 days. Beyond this can weaken or lighten the skin tone.   Can use ketoconazole for jock itch when occurs

## 2017-07-31 NOTE — Assessment & Plan Note (Signed)
S: GAD7 today of 2. He has been on xanax 0.5mg  in AM and 0.25mg  in PM for most part- we titrated zoloft to 150mg  with hopes he could go to 0.25 mg  BID.   He took 2 weeks off and felt dizzy, very anxious and ended up refilling.   He is now down to mainly 0.5mg  each morning.  A/P:continue zoloft 150mg . He has really been able to cut down xanax now from 1mg  total per day to 0.5mg  per day. Will trial 0.5 mg one day and alternate days with 0.25mg - then next visit may try 0.25mg  per day. He does sparingly use later in the day if anxiety increases

## 2017-07-31 NOTE — Assessment & Plan Note (Signed)
Smoking cessation counseling- 3 minutes spent on this topic Ask - last visit smoking 5-8 cigarettes per day. Sports bar = trigger. He is now back up to 8-10.  Advise - Strongly encouraged smoking cessation and discussed long term risks to health including cancer and cardiovascular risk Assess - Patient is  not ready to quit smoking, but is willing to try to cut back.  Assist - doesn't do well with patch or gum but apparently has several patches at home Arrange - advised trial 7mg  patch . I would be ok with him using this until 4 month follow up as long as he cuts down further on cigarettes

## 2017-07-31 NOTE — Assessment & Plan Note (Signed)
S: very well controlled on atorvastatin 20mg  with LDL 59 in august 2018. No myalgias.   A/P: he wants to switch to zetia because of something he heard- I advised against as lipids have looked great

## 2017-07-31 NOTE — Progress Notes (Signed)
Subjective:  Jonathon [name] is a 77 y.o. year old very pleasant male patient who presents for/with See problem oriented charting ROS- no chest pain or shortness of breath reported.  No edema. No abdominal pain.   Past Medical History-  Patient Active Problem List   Diagnosis Date Noted  . Anxiety     Priority: High  . Cigarette smoker     Priority: High  . Hyperlipidemia 01/28/2017    Priority: Medium  . Pulmonary nodules 12/26/2016    Priority: Medium  . Aortic atherosclerosis (HCC) 10/28/2016    Priority: Medium  . Neuropathy     Priority: Medium  . DDD (degenerative disc disease), lumbar     Priority: Medium  . Genetic testing 10/14/2016    Priority: Low  . Family history of colon cancer 10/02/2016    Priority: Low  . Family history of breast cancer     Priority: Low  . Hilar lymphadenopathy 09/23/2016    Priority: Low  . Lymph nodes enlarged 09/23/2016    Priority: Low  . Family history of genetic disease 08/28/2016    Priority: Low  . Macular degeneration 07/21/2016    Priority: Low  . Jock itch 07/31/2017    Medications- reviewed and updated Current Outpatient Medications  Medication Sig Dispense Refill  . ALPRAZolam (XANAX) 0.5 MG tablet Take 1 tablet (0.5 mg total) by mouth 2 (two) times daily. 60 tablet 3  . aspirin EC 81 MG tablet Take 81 mg by mouth daily.    Marland Kitchen atorvastatin (LIPITOR) 20 MG tablet Take 1 tablet (20 mg total) by mouth daily. 90 tablet 1  . gabapentin (NEURONTIN) 400 MG capsule TAKE 1 CAPSULE MIDDAY AND 2 CAPSULES AT BEDTIME.    Marland Kitchen HYDROcodone-acetaminophen (NORCO/VICODIN) 5-325 MG tablet Take 1 tablet by mouth every 6 (six) hours as needed for moderate pain.    . meloxicam (MOBIC) 15 MG tablet Take 1 tablet (15 mg total) by mouth daily. 90 tablet 3  . Multiple Vitamins-Minerals (PRESERVISION AREDS 2 PO) Take 1 tablet by mouth daily.    . NON FORMULARY     . sertraline (ZOLOFT) 100 MG tablet Take 1.5 tablets (150 mg total) daily by mouth. 135  tablet 1   No current facility-administered medications for this visit.    Objective: BP 116/72 (BP Location: Left Arm, Patient Position: Sitting, Cuff Size: Large)   Pulse 65   Temp (!) 97.4 F (36.3 C)   Ht 5\' 9"  (1.753 m)   Wt 212 lb 12.8 oz (96.5 kg)   SpO2 95%   BMI 31.43 kg/m  Gen: NAD, resting comfortably, smells of smoke CV: RRR no murmurs rubs or gallops Lungs: CTAB no crackles, wheeze, rhonchi Abdomen: soft/nontender/nondistended/normal bowel sounds. obese Ext: no edema Skin: warm, dry  Assessment/Plan:  Anxiety S: GAD7 today of 2. He has been on xanax 0.5mg  in AM and 0.25mg  in PM for most part- we titrated zoloft to 150mg  with hopes he could go to 0.25 mg  BID.   He took 2 weeks off and felt dizzy, very anxious and ended up refilling.   He is now down to mainly 0.5mg  each morning.  A/P:continue zoloft 150mg . He has really been able to cut down xanax now from 1mg  total per day to 0.5mg  per day. Will trial 0.5 mg one day and alternate days with 0.25mg - then next visit may try 0.25mg  per day. He does sparingly use later in the day if anxiety increases  Cigarette smoker Smoking cessation  counseling- 3 minutes spent on this topic Ask - last visit smoking 5-8 cigarettes per day. Sports bar = trigger. He is now back up to 8-10.  Advise - Strongly encouraged smoking cessation and discussed long term risks to health including cancer and cardiovascular risk Assess - Patient is  not ready to quit smoking, but is willing to try to cut back.  Assist - doesn't do well with patch or gum but apparently has several patches at home Arrange - advised trial 7mg  patch . I would be ok with him using this until 4 month follow up as long as he cuts down further on cigarettes     Hyperlipidemia S: very well controlled on atorvastatin 20mg  with LDL 59 in august 2018. No myalgias.   A/P: he wants to switch to zetia because of something he heard- I advised against as lipids have looked  great  DDD (degenerative disc disease), lumbar S: follows with orthopedist Dr. Leron CroakNitka [facility]. mobic 15mg  daily and hydrocodone 5/325 prn. Also on gabapentin for neuropathy.  A/P: with him regularly using mobic- will update bmet  Jock itch Ketoconazole prn written. He had been on betamethasone/clotrimazole- I advised trial ketoconazole alone. He also used it on his face for rashes- I advised hydrocortisone 1% alone up to 7 days max and see us if not improving  Future Appointments  Date Time Provider Department Center  12/28/2017  9:00 AM Parrett, Virgel Bouquetammy S, NP [facility] None   Lab/Order associations: Hyperlipidemia, unspecified hyperlipidemia type - Plan: Basic metabolic panel  Meds ordered this encounter  Medications  . ketoconazole (NIZORAL) 2 % cream    Sig: Apply 1 application topically daily. For jock itch up to 2 weeks    Dispense:  60 g    Refill:  2    Return precautions advised.  Tana ConchStephen Thayer Inabinet, MD

## 2017-08-04 ENCOUNTER — Telehealth: Payer: Self-pay | Admitting: Family Medicine

## 2017-08-04 NOTE — Telephone Encounter (Signed)
Pt returned call and lab results given to him. Pt voiced understanding.

## 2017-08-06 ENCOUNTER — Other Ambulatory Visit: Payer: Self-pay

## 2017-08-06 MED ORDER — GABAPENTIN 400 MG PO CAPS: ORAL_CAPSULE | ORAL | 5 refills | Status: DC

## 2017-09-11 ENCOUNTER — Ambulatory Visit (INDEPENDENT_AMBULATORY_CARE_PROVIDER_SITE_OTHER)
Admission: RE | Admit: 2017-09-11 | Discharge: 2017-09-11 | Disposition: A | Discharge status: Home / Self Care | Payer: Medicare Other | Source: Ambulatory Visit | Attending: Acute Care | Admitting: Acute Care

## 2017-09-11 DIAGNOSIS — F17210 Nicotine dependence, cigarettes, uncomplicated: Secondary | ICD-10-CM | POA: Diagnosis not present

## 2017-09-16 ENCOUNTER — Other Ambulatory Visit: Payer: Self-pay | Admitting: Acute Care

## 2017-09-16 DIAGNOSIS — F17210 Nicotine dependence, cigarettes, uncomplicated: Secondary | ICD-10-CM

## 2017-09-16 DIAGNOSIS — Z122 Encounter for screening for malignant neoplasm of respiratory organs: Secondary | ICD-10-CM

## 2017-10-07 ENCOUNTER — Ambulatory Visit (INDEPENDENT_AMBULATORY_CARE_PROVIDER_SITE_OTHER): Payer: Self-pay | Admitting: Physical Medicine and Rehabilitation

## 2017-11-22 IMAGING — RF DG ESOPHAGUS
11 of 13 series · 14 of 24 positions shown · non-contrast
Comparison: 05/28/2016 endoscopy and removal report.

CLINICAL DATA: Dysphagia. Status post food impaction. Status post
disimpaction from the proximal esophagus on 05/28/2016.

EXAM:
ESOPHOGRAM / BARIUM SWALLOW / BARIUM TABLET STUDY
TECHNIQUE: Combined double contrast and single contrast examination performed
using effervescent crystals, thick barium liquid, and thin barium
liquid. The patient was observed with fluoroscopy swallowing a 13 mm
barium sulphate tablet.
FLUOROSCOPY TIME:  Fluoroscopy Time:  4 minutes and 30 seconds
Radiation Exposure Index (if provided by the fluoroscopic device):
255
Number of Acquired Spot Images: 4

[Series 1: sequence · 1 of 34 frames shown (1 of 10)]
[frame 6/34]
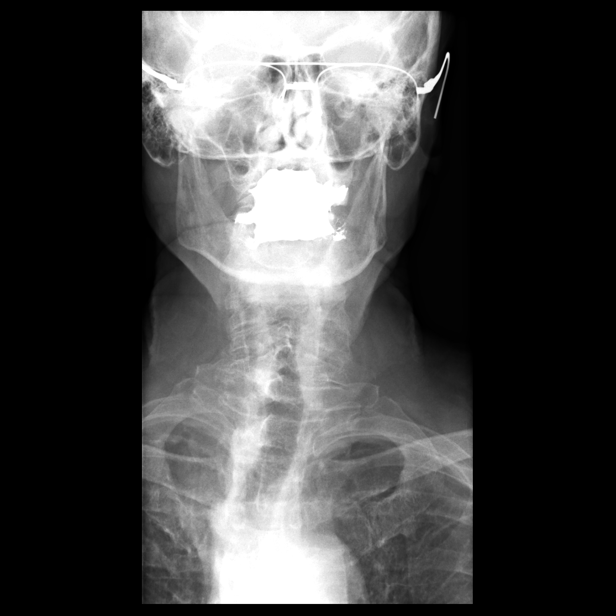

[Series 2: sequence · 1 of 6 frames shown (2 of 10)]
[frame 1/6]
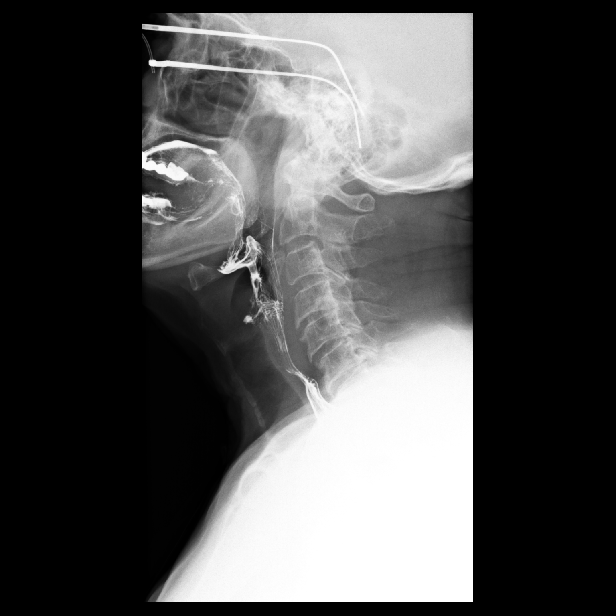

[Series 3: sequence · 1 of 35 frames shown (3 of 10)]
[frame 6/35]
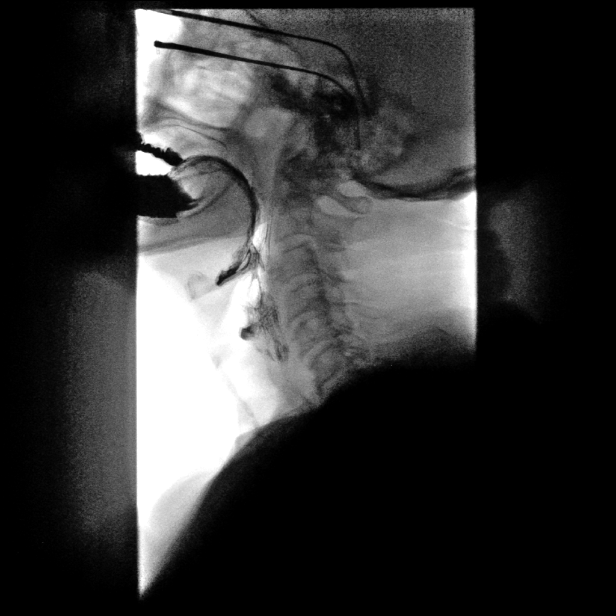

[Series 4: sequence · 2 of 24 frames shown (4 of 10)]
[frame 1/24]
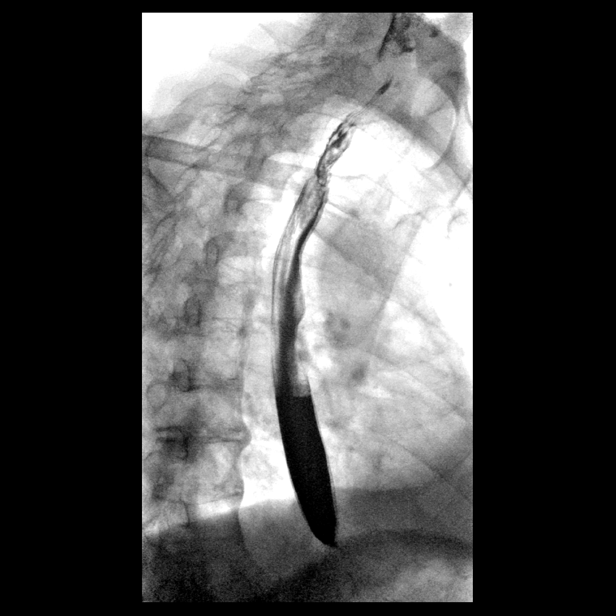
[frame 13/24]
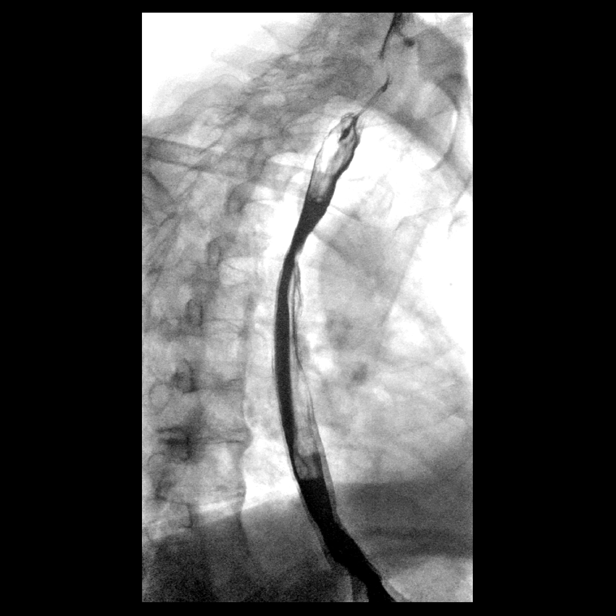

[Series 6: sequence · 1 of 2 frames shown (5 of 10)]
[frame 2/2]
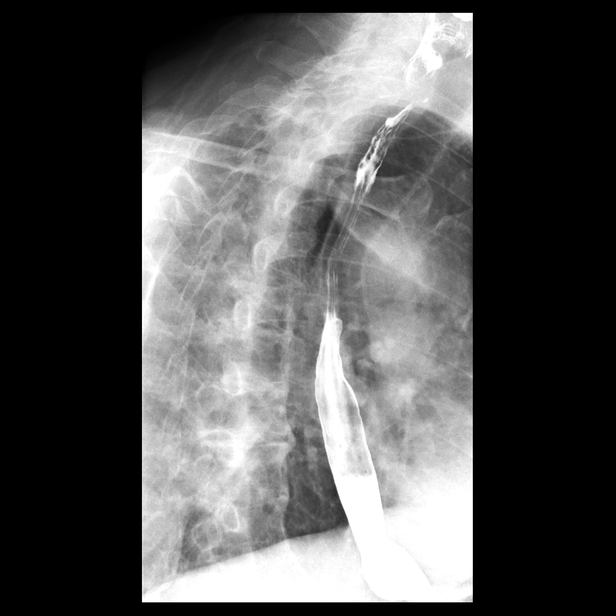

[Series 8: sequence · 2 of 55 frames shown (6 of 10)]
[frame 9/55]
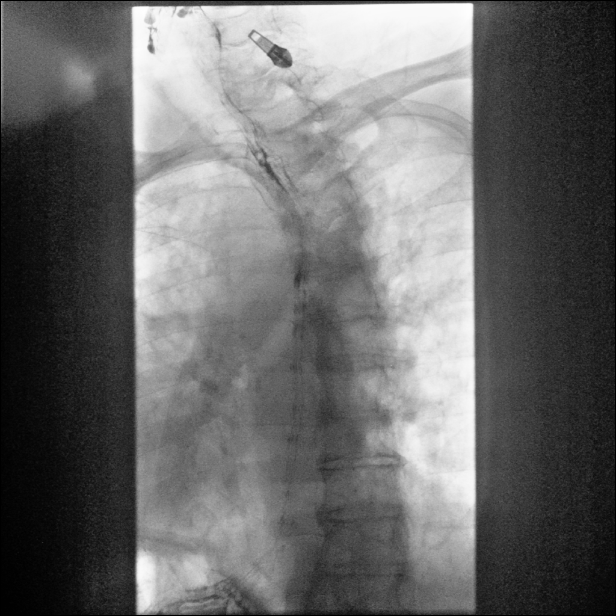
[frame 32/55]
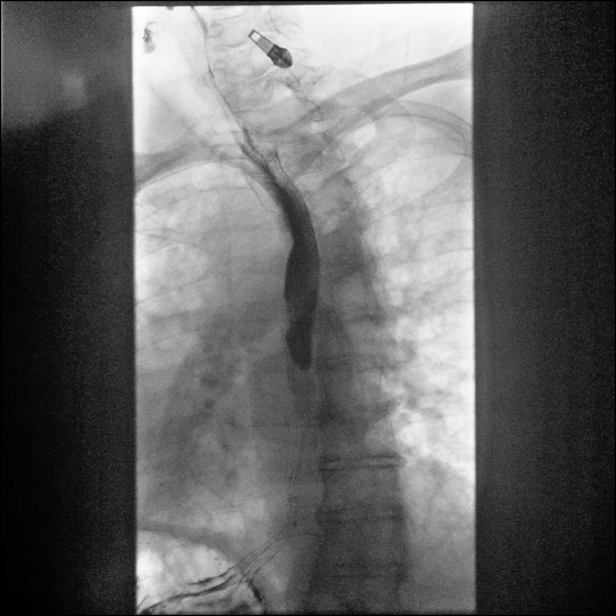

[Series 9: sequence · 1 of 51 frames shown (7 of 10)]
[frame 26/51]
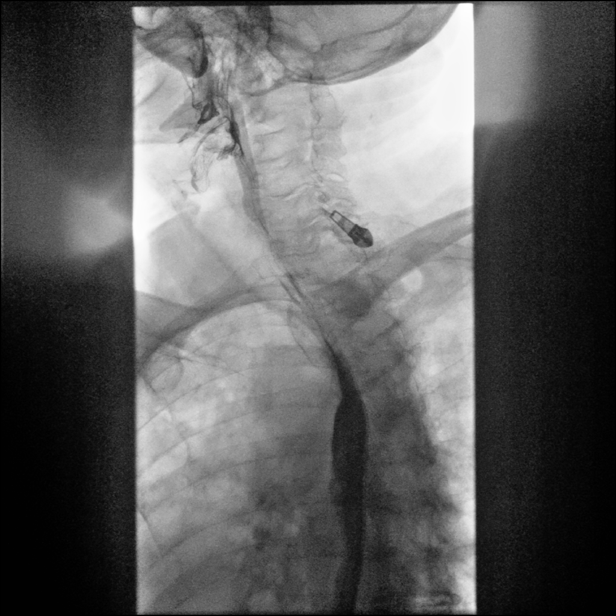

[Series 10: sequence · 1 of 62 frames shown (8 of 10)]
[frame 32/62]
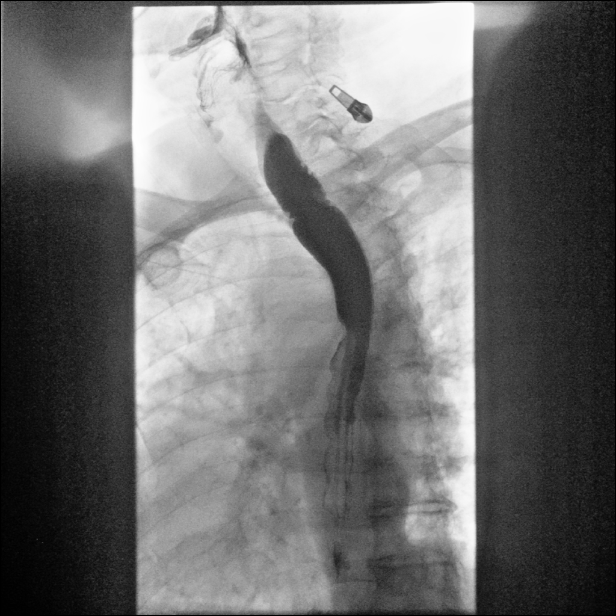

[Series 11: sequence · 2 of 24 frames shown (9 of 10)]
[frame 4/24]
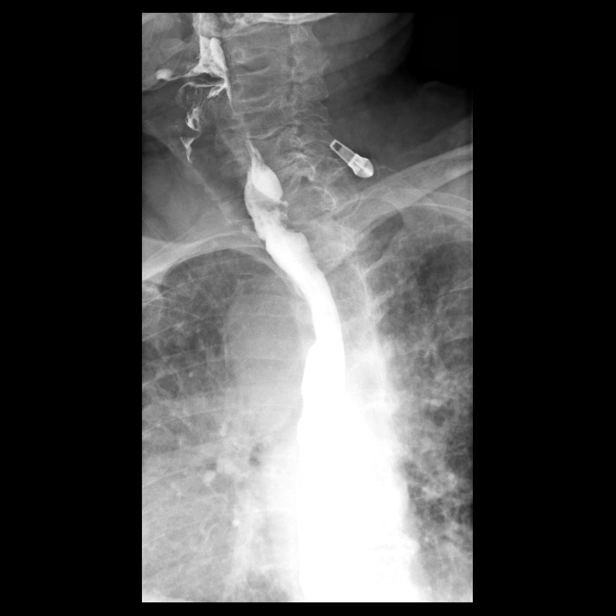
[frame 13/24]
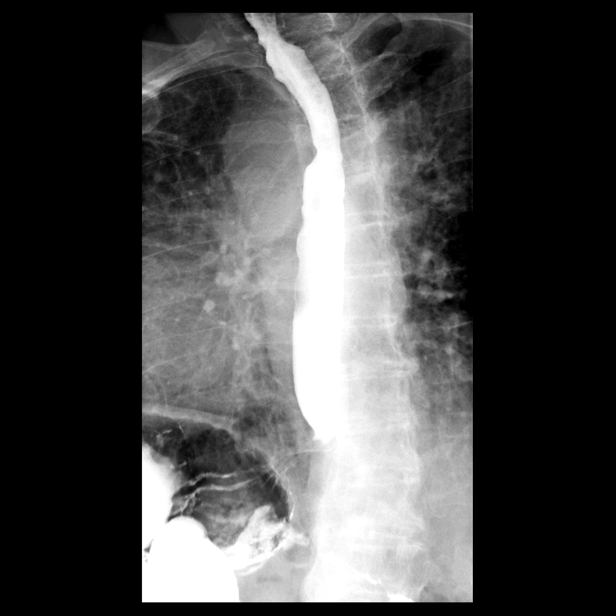

[Series 12: sequence · 1 of 4 frames shown (10 of 10)]
[frame 4/4]
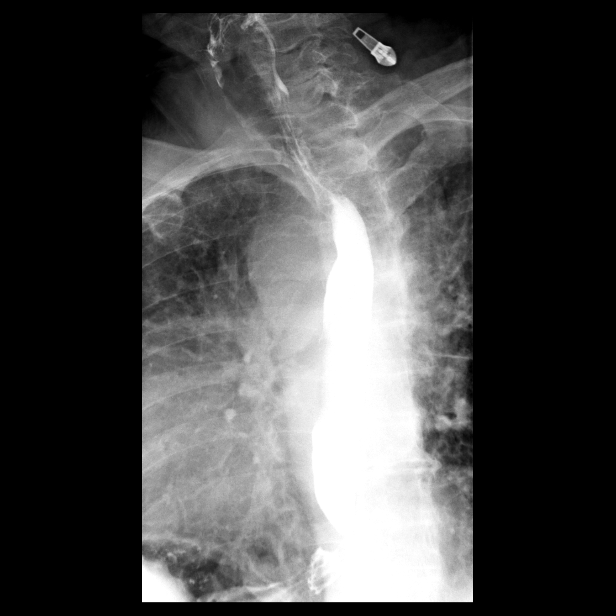

[Series 13: one shot · 1 of 3 slices shown]
[im 3/3]
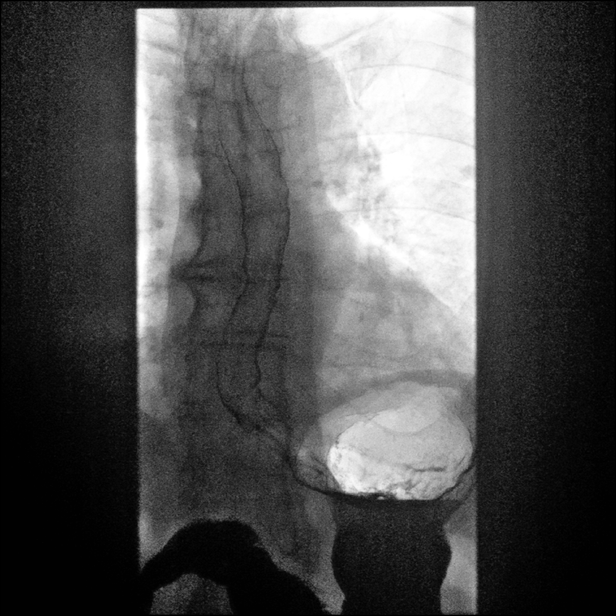

[14 of 24 positions shown; findings below may reference images not displayed]

FINDINGS: Hypopharyngeal portion of the exam is unremarkable.

Double contrast evaluation of the esophagus demonstrates no mucosal
abnormality.

Evaluation of primary peristalsis demonstrates mild esophageal
dysmotility with proximal escape and contrast stasis within the
upper thoracic esophagus.

Full column evaluation of the esophagus demonstrates subtle luminal
irregularity in the region of the thoracic inlet. Example image
30/series 1 and image 39/series 9. Also image 31/series 10. The more
distal esophagus is unremarkable.

A 13 mm barium tablet passes promptly.
IMPRESSION: 1. Luminal irregularity in the proximal esophagus is nonspecific,
but given the clinical history, likely the sequelae of recent food
impaction.
2. Mild esophageal dysmotility, likely presbyesophagus.

## 2017-12-02 NOTE — Progress Notes (Signed)
Subjective:   Jonathon [name] is a 77 y.o. male who presents for Medicare Annual/Subsequent preventive examination.  Reports health as good  Last seen Dr. Yong Channel 07/31/2017  Hx MD   [facility]     Plans to move to one level home in the future if you cannot stay in your home  Has to climb stairs now; split level home  Was a plumber, air and heating guy   Diet BMI 31 Eat sandwich in the am  Eat a good meal around 2pm  Snack at hs   Exercise Back hurts but states neuropathy is worse that back; this is when he uses narco Walk and talk   Health Maintenance Due  Topic Date Due  . TETANUS/TDAP  10/27/1959   Colonoscopy - states he had one 5 years ago May repeat in 5 more years with Dr. Collene Mares  PSA does not get this anymore  Will [facility] T[facility]ap at [facility] or other   Currently in LDCT program  Current every day smoker  Discussed quitting; states he liked the patches but broke out  Educated regarding shingrix  Slowed down from a pack to half a pack Still has hope he can quit Barriers; he jokes and states beer and food - rare drinker   Cardiac Risk Factors include: advanced age (>49mn, >>29women);dyslipidemia;male gender;obesity (BMI >30kg/m2)       Objective:    Vitals: BP 116/76   Pulse 80   Ht 5' 9"  (1.753 m)   Wt 214 lb (97.1 kg)   BMI 31.60 kg/m   Body mass index is 31.6 kg/m.  Advanced Directives 12/03/2017 10/28/2016 10/28/2016 05/28/2016  Does Patient Have a Medical Advance Directive? Yes (No Data) No No  Would patient like information on creating a medical advance directive? - - - No - Patient declined   This they have got this but given [place] form for educational purposes   Tobacco Social History   Tobacco Use  Smoking Status Current Every Day Smoker  . Packs/day: 1.00  . Years: 59.00  . Pack years: 59.00  . Types: Cigarettes  Smokeless Tobacco Never Used  Tobacco Comment   wants to continue to cut back     Ready to  quit: Yes Counseling given: Yes Comment: wants to continue to cut back   Clinical Intake:      Past Medical History:  Diagnosis Date  . Anxiety    never tried on SSRI. xanax 0.518mBID through Dr. BuTollie Pizza  . DDD (degenerative disc disease), lumbar    lumbar DDD- follows with orthopedist Dr. NiLinus Ornrtho. mobic 1578maily and hydrocodone 5/325 prn  . Family history of breast cancer   . Macular degeneration 07/21/2016   Follows with optho- areds2. He states he is unsure of diagnosis.   . Neuropathy    unknown cause ? back related(sees ortho for back). burning sensation in feet. gabapentin 100m61mter dinner, 2 before bed  . Smoker    8-10 cigarettes a day. plus nicorette. 60 years. over 50 pack years  . Venous stasis     in the past- Wound center on legs. Elevate. Compression stockings.    Past Surgical History:  Procedure Laterality Date  . ESOPHAGOGASTRODUODENOSCOPY Left 05/28/2016    Surgeon: JyotJuanita Craver; food stuck in esophagus   Family History  Problem Relation Age of Onset  . Other Mother        "old age" per  patient   . Dementia Mother   . Alcohol abuse Father        contributed  . Cancer Sister        unknown type  . Alcohol abuse Brother   . Breast cancer Sister        dx in her early 40s  . Other Sister        PALB2+  . Breast cancer Maternal Aunt   . Colon cancer Maternal Uncle   . Breast cancer Other 69   Social History   Socioeconomic History  . Marital status: Married    Spouse name: Not on file  . Number of children: Not on file  . Years of education: Not on file  . Highest education level: Not on file  Occupational History  . Not on file  Social Needs  . Financial resource strain: Not on file  . Food insecurity:    Worry: Not on file    Inability: Not on file  . Transportation needs:    Medical: Not on file    Non-medical: Not on file  Tobacco Use  . Smoking status: Current Every Day Smoker    Packs/day: 1.00    Years:  59.00    Pack years: 59.00    Types: Cigarettes  . Smokeless tobacco: Never Used  . Tobacco comment: wants to continue to cut back  Substance and Sexual Activity  . Alcohol use: Yes    Comment: rare drinking occasionally  . Drug use: No  . Sexual activity: Not on file  Lifestyle  . Physical activity:    Days per week: Not on file    Minutes per session: Not on file  . Stress: Not on file  Relationships  . Social connections:    Talks on phone: Not on file    Gets together: Not on file    Attends religious service: Not on file    Active member of club or organization: Not on file    Attends meetings of clubs or organizations: Not on file    Relationship status: Not on file  Other Topics Concern  . Not on file  Social History Narrative   Married around 1964. 3 children. 6 grandkids.       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 ALPRAZolam (XANAX) 0.5 MG tablet Take 1 tablet (0.5 mg total) by mouth 2 (two) times daily as needed for anxiety (1 refill per month).  Marland Kitchen aspirin EC 81 MG tablet Take 81 mg by mouth daily.  Marland Kitchen atorvastatin (LIPITOR) 20 MG tablet Take 1 tablet (20 mg total) by mouth daily.  Marland Kitchen gabapentin (NEURONTIN) 400 MG capsule TAKE 1 CAPSULE MIDDAY AND 2 CAPSULES AT BEDTIME.  Marland Kitchen HYDROcodone-acetaminophen (NORCO/VICODIN) 5-325 MG tablet Take 1 tablet by mouth every 6 (six) hours as needed for moderate pain.  Marland Kitchen ketoconazole (NIZORAL) 2 % cream Apply 1 application topically daily. For jock itch up to 2 weeks  . meloxicam (MOBIC) 15 MG tablet Take 1 tablet (15 mg total) by mouth daily.  . Multiple Vitamins-Minerals (PRESERVISION AREDS 2 PO) Take 1 tablet by mouth daily.  . NON FORMULARY   . sertraline (ZOLOFT) 100 MG tablet Take 1.5 tablets (150 mg total) daily by mouth.  . [DISCONTINUED] ALPRAZolam (XANAX) 0.5 MG tablet Take 1 tablet (0.5 mg  total) by mouth 2 (two) times daily.   No  facility-administered encounter medications on file as of 12/03/2017.     Activities of Daily Living In your present state of health, do you have any difficulty performing the following activities: 12/03/2017  Hearing? N  Vision? N  Difficulty concentrating or making decisions? N  Walking or climbing stairs? N  Comment has stairs in home  Dressing or bathing? N  Doing errands, shopping? N  Preparing Food and eating ? N  Using the Toilet? N  In the past six months, have you accidently leaked urine? N  Do you have problems with loss of bowel control? N  Managing your Medications? N  Managing your Finances? N  Housekeeping or managing your Housekeeping? N  Some recent data might be hidden    Patient Care Team: Marin Olp, MD as PCP - General (Family Medicine)   Assessment:   This is a routine wellness examination for Leeum.  Exercise Activities and Dietary recommendations Current Exercise Habits: Home exercise routine, Type of exercise: walking(limited), Exercise limited by: orthopedic condition(s);neurologic condition(s)  Goals    . Patient Stated     Maintain your health !     . Quit Smoking     Now smokes 1/2 pack from a pack a day  Smoking;  Educated to avoid secondary smoke   Meds may help; chatix (Varenicline); Zyban (Bupropion SR); Nicotine Replacement (gum; lozenges; patches; etc.)          Fall Risk Fall Risk  12/03/2017 12/03/2017 10/28/2016 07/21/2016  Falls in the past year? No No No No    Depression Screen PHQ 2/9 Scores 12/03/2017 07/31/2017 10/28/2016 07/21/2016  PHQ - 2 Score 0 1 0 0  PHQ- 9 Score - 2 - -    Cognitive Function MMSE - Mini Mental State Exam 12/03/2017  Not completed: (No Data)   Ad8 score reviewed for issues:  Issues making decisions:  Less interest in hobbies / activities:  Repeats questions, stories (family complaining):  Trouble using ordinary gadgets (microwave, computer,  phone):  Forgets the month or year:   Mismanaging finances:   Remembering appts:  Daily problems with thinking and/or memory: Ad8 score is=Judgement is still intact and problem solving No issues with daily living       6CIT Screen 10/28/2016  What Year? 0 points  What month? 0 points  What time? 0 points  Count back from 20 0 points  Months in reverse 0 points  Repeat phrase 6 points  Total Score 6    Immunization History  Administered Date(s) Administered  . Influenza, High Dose Seasonal PF 05/18/2015, 03/18/2016, 03/26/2017  . Influenza-Unspecified 05/18/2015, 03/18/2016, 04/30/2016  . Pneumococcal Conjugate-13 01/31/2014  . Pneumococcal Polysaccharide-23 12/03/2017  . Zoster 07/06/2013     Screening Tests Health Maintenance  Topic Date Due  . TETANUS/TDAP  10[birth year]27[birth year]1959  . INFLUENZA VACCINE  01/14/2018  . PNA vac Low Risk Adult  Completed         Plan:      PCP Notes   Health Maintenance [facility] Will fup on shingrix  Colonoscopy by Dr. Collene Mares; states he has another 5 years Vision checks every year  Stopped climbing ladders    Abnormal Screens  none  Referrals  none  Patient concerns; Is trying to quit smoking  Nurse Concerns; As noted  Next PCP apt Was seen today       I have personally reviewed and noted the following in the patient's chart:   . Medical  and social history . Use of alcohol, tobacco or illicit drugs  . Current medications and supplements . Functional ability and status . Nutritional status . Physical activity . Advanced directives . List of other physicians . Hospitalizations, surgeries, and ER visits in previous 12 months . Vitals . Screenings to include cognitive, depression, and falls . Referrals and appointments  In addition, I have reviewed and discussed with patient certain preventive protocols, quality metrics, and best practice recommendations. A written personalized care  plan for preventive services as well as general preventive health recommendations were provided to patient.     Wynetta Fines, RN  12/03/2017

## 2017-12-03 ENCOUNTER — Encounter: Payer: Self-pay | Admitting: *Deleted

## 2017-12-03 ENCOUNTER — Ambulatory Visit (INDEPENDENT_AMBULATORY_CARE_PROVIDER_SITE_OTHER): Payer: Medicare Other | Admitting: Family Medicine

## 2017-12-03 ENCOUNTER — Encounter: Payer: Self-pay | Admitting: Family Medicine

## 2017-12-03 ENCOUNTER — Ambulatory Visit (INDEPENDENT_AMBULATORY_CARE_PROVIDER_SITE_OTHER): Payer: Medicare Other | Admitting: *Deleted

## 2017-12-03 VITALS — BP 116/76 | HR 80 | Ht 69.0 in | Wt 214.0 lb

## 2017-12-03 VITALS — BP 116/76 | HR 80 | Temp 98.4°F | Ht 69.0 in | Wt 214.8 lb

## 2017-12-03 DIAGNOSIS — Z23 Encounter for immunization: Secondary | ICD-10-CM

## 2017-12-03 DIAGNOSIS — E785 Hyperlipidemia, unspecified: Secondary | ICD-10-CM | POA: Diagnosis not present

## 2017-12-03 DIAGNOSIS — Z0000 Encounter for general adult medical examination without abnormal findings: Secondary | ICD-10-CM | POA: Diagnosis not present

## 2017-12-03 DIAGNOSIS — F411 Generalized anxiety disorder: Secondary | ICD-10-CM | POA: Diagnosis not present

## 2017-12-03 DIAGNOSIS — G4733 Obstructive sleep apnea (adult) (pediatric): Secondary | ICD-10-CM | POA: Diagnosis not present

## 2017-12-03 DIAGNOSIS — Z9989 Dependence on other enabling machines and devices: Secondary | ICD-10-CM | POA: Diagnosis not present

## 2017-12-03 DIAGNOSIS — M5136 Other intervertebral disc degeneration, lumbar region: Secondary | ICD-10-CM | POA: Diagnosis not present

## 2017-12-03 LAB — COMPREHENSIVE METABOLIC PANEL
ALT: 26 U/L (ref 0–53)
AST: 24 U/L (ref 0–37)
Albumin: 4.2 g/dL (ref 3.5–5.2)
Alkaline Phosphatase: 87 U/L (ref 39–117)
BILIRUBIN TOTAL: 0.7 mg/dL (ref 0.2–1.2)
BUN: 13 mg/dL (ref 6–23)
CHLORIDE: 103 meq/L (ref 96–112)
CO2: 28 meq/L (ref 19–32)
CREATININE: 1.02 mg/dL (ref 0.40–1.50)
Calcium: 9.4 mg/dL (ref 8.4–10.5)
GFR: 75.25 mL/min (ref >60.00)
GLUCOSE: 83 mg/dL (ref 70–99)
Potassium: 4.2 mEq/L (ref 3.5–5.1)
SODIUM: 138 meq/L (ref 135–145)
Total Protein: 7.3 g/dL (ref 6.0–8.3)

## 2017-12-03 LAB — LDL CHOLESTEROL, DIRECT: Direct LDL: 72 mg/dL

## 2017-12-03 MED ORDER — ALPRAZOLAM 0.5 MG PO TABS: 0.5000 mg | ORAL_TABLET | Freq: Two times a day (BID) | ORAL | 4 refills | Status: DC | PRN

## 2017-12-03 NOTE — Assessment & Plan Note (Signed)
S: continues to follow up with Dr. Otelia SergeantNitka for DDD lumbar spine of [facility]. Uses mobic 15mg  daily and hydrocodone prn - very rare and spaces by at least 8 hours from xanax.  A/P: update bmet given continued nsaid use - we discussed separating xanax and hydrocodone by at least 8 hours- he doesn't think that will be a problem

## 2017-12-03 NOTE — Progress Notes (Signed)
Subjective:  Jonathon [name] is a 77 y.o. year old very pleasant male patient who presents for/with See problem oriented charting ROS- stable shortness of breath. No chest pain. No headache or blurry vision.    Past Medical History-  Patient Active Problem List   Diagnosis Date Noted  . Anxiety     Priority: High  . Cigarette smoker     Priority: High  . OSA on CPAP 12/03/2017    Priority: Medium  . Hyperlipidemia 01/28/2017    Priority: Medium  . Pulmonary nodules 12/26/2016    Priority: Medium  . Aortic atherosclerosis (HCC) 10/28/2016    Priority: Medium  . Neuropathy     Priority: Medium  . DDD (degenerative disc disease), lumbar     Priority: Medium  . Genetic testing 10/14/2016    Priority: Low  . Family history of colon cancer 10/02/2016    Priority: Low  . Family history of breast cancer     Priority: Low  . Hilar lymphadenopathy 09/23/2016    Priority: Low  . Lymph nodes enlarged 09/23/2016    Priority: Low  . Family history of genetic disease 08/28/2016    Priority: Low  . Macular degeneration 07/21/2016    Priority: Low  . Jock itch 07/31/2017    Medications- reviewed and updated Current Outpatient Medications  Medication Sig Dispense Refill  . ALPRAZolam (XANAX) 0.5 MG tablet Take 1 tablet (0.5 mg total) by mouth 2 (two) times daily as needed for anxiety (1 refill per month). 35 tablet 4  . aspirin EC 81 MG tablet Take 81 mg by mouth daily.    Marland Kitchen atorvastatin (LIPITOR) 20 MG tablet Take 1 tablet (20 mg total) by mouth daily. 90 tablet 1  . gabapentin (NEURONTIN) 400 MG capsule TAKE 1 CAPSULE MIDDAY AND 2 CAPSULES AT BEDTIME. 270 capsule 5  . HYDROcodone-acetaminophen (NORCO/VICODIN) 5-325 MG tablet Take 1 tablet by mouth every 6 (six) hours as needed for moderate pain.    Marland Kitchen ketoconazole (NIZORAL) 2 % cream Apply 1 application topically daily. For jock itch up to 2 weeks 60 g 2  . meloxicam (MOBIC) 15 MG tablet Take 1 tablet (15 mg total) by mouth daily. 90  tablet 3  . Multiple Vitamins-Minerals (PRESERVISION AREDS 2 PO) Take 1 tablet by mouth daily.    . NON FORMULARY     . sertraline (ZOLOFT) 100 MG tablet Take 1.5 tablets (150 mg total) daily by mouth. 135 tablet 1   No current facility-administered medications for this visit.     Objective: BP 116/76 (BP Location: Left Arm, Patient Position: Sitting, Cuff Size: Normal)   Pulse 80   Temp 98.4 F (36.9 C) (Oral)   Ht 5\' 9"  (1.753 m)   Wt 214 lb 12.8 oz (97.4 kg)   SpO2 94%   BMI 31.72 kg/m  Gen: NAD, resting comfortably CV: RRR no murmurs rubs or gallops Lungs: CTAB no crackles, wheeze, rhonchi Abdomen: soft/nontender/nondistended/normal bowel sounds.  Ext: no edema under compression stockings Skin: warm, dry MSK: wears back brace  Assessment/Plan:   Other notes: 1. ketoconazole with jock itch- he states this has cleared up 2. Dry mouth even with using humidifier with cpap. Advised to talk to [place] 3. Smoking 1/2 PPD- encouraged cessation- not ready to quit  GAD (generalized anxiety disorder) S:  patient remains on zoloft 150mg .  Prior to zoloft he was taking BID xanax 0.5mg .Last visit was tolerating xanax 0.5mg  per day. Was to trial 0.25mg  and 0.5 mg alternating.  He has not tried this. He feels anxious each morning- feels like used to get up and go and just needs to get up and go and do something but feels anxious in doing so. He is strongly against psychology/counseling even despite encouragement- Calls counselors "turkeys" today.   GAD7- scores a 6 today and rates as somewhat difficult, was 2 last visit.  A/P: continue zoloft 150mg . Try to go to xanax 0.5mg  one day and 0.25mg  next day and alternate- see if we can get him down to 0.25mg  daily next visit. I think he could benefit from counseling but if he doesn't think he will he is less likely to  Hyperlipidemia S:  controlled on atorvastatin 20mg  with LDL 59 last august  A/P: LDL up to 72 today- no CVA/MI history- will not  titrate statin at present to get LDL back below 70.   DDD (degenerative disc disease), lumbar S: continues to follow up with Dr. Otelia SergeantNitka for DDD lumbar spine of [facility]. Uses mobic 15mg  daily and hydrocodone prn - very rare and spaces by at least 8 hours from xanax.  A/P: update bmet given continued nsaid use - we discussed separating xanax and hydrocodone by at least 8 hours- he doesn't think that will be a problem   Future Appointments  Date Time Provider Department Center  12/28/2017  9:00 AM Parrett, Virgel Bouquetammy S, NP [facility] None   Return in about 6 months (around 06/04/2018) for follow up- or sooner if needed.  Lab/Order associations: Hyperlipidemia, unspecified hyperlipidemia type - Plan: Comprehensive metabolic panel, LDL cholesterol, direct  Need for prophylactic vaccination against Streptococcus pneumoniae (pneumococcus) - Plan: Pneumococcal polysaccharide vaccine 23-valent greater than or equal to 2yo subcutaneous/IM  Meds ordered this encounter  Medications  . ALPRAZolam (XANAX) 0.5 MG tablet    Sig: Take 1 tablet (0.5 mg total) by mouth 2 (two) times daily as needed for anxiety (1 refill per month).    Dispense:  35 tablet    Refill:  4    Return precautions advised.  Tana ConchStephen Chicquita Mendel, MD

## 2017-12-03 NOTE — Patient Instructions (Addendum)
I would also like for you to sign up for an annual wellness visit with one of our nurses, Cassie or Darl PikesSusan, who both specialize in the annual wellness visit. This is a free benefit under medicare that may help us find additional ways to help you. Some highlights are reviewing medications, lifestyle, and doing a dementia screen.  Try to use 1 full xanax 1 morning and then use just half tab next morning- alternate back and forth  Please stop by lab before you go   Health Maintenance Due  Topic Date Due  . TETANUS/TDAP - Informed patient to have it done at his pharmacy, then call and update us with the date.  10/27/1959  . PNA vac Low Risk Adult (2 of 2 - PPSV23) - Today at office visit 02/01/2015

## 2017-12-03 NOTE — Assessment & Plan Note (Signed)
S:  controlled on atorvastatin 20mg  with LDL 59 last august  A/P: LDL up to 72 today- no CVA/MI history- will not titrate statin at present to get LDL back below 70.

## 2017-12-03 NOTE — Assessment & Plan Note (Signed)
S:  patient remains on zoloft 150mg .  Prior to zoloft he was taking BID xanax 0.5mg .Last visit was tolerating xanax 0.5mg  per day. Was to trial 0.25mg  and 0.5 mg alternating. He has not tried this. He feels anxious each morning- feels like used to get up and go and just needs to get up and go and do something but feels anxious in doing so. He is strongly against psychology/counseling even despite encouragement- Calls counselors "turkeys" today.   GAD7- scores a 6 today and rates as somewhat difficult, was 2 last visit.  A/P: continue zoloft 150mg . Try to go to xanax 0.5mg  one day and 0.25mg  next day and alternate- see if we can get him down to 0.25mg  daily next visit. I think he could benefit from counseling but if he doesn't think he will he is less likely to

## 2017-12-03 NOTE — Patient Instructions (Addendum)
Jonathon [name] , Thank you for taking time to come for your Medicare Wellness Visit. I appreciate your ongoing commitment to your health goals. Please review the following plan we discussed and let me know if I can assist you in the future.  Shingrix is a vaccine for the prevention of Shingles in Adults 50 and older.  If you are on Medicare, the shingrix is covered under your Part D plan, so you will take both of the vaccines in the series at your pharmacy. Please check with your benefits regarding applicable copays or out of pocket expenses.  The Shingrix is given in 2 vaccines approx 8 weeks apart. You must receive the 2nd dose prior to 6 months from receipt of the first. Please have the pharmacist print out you Immunization  dates for our office records     A Tetanus is recommended every 10 years. Medicare covers a tetanus if you have a cut or wound; otherwise, there may be a charge. If you had not had a tetanus with pertusses, known as the Tdap, you can take this anytime.  Can take at the [facility] or may be cheaper at [facility]   May check on your Health Care POA and let us know if you have completed   These are the goals we discussed: Goals    . Patient Stated     Maintain your health !        This is a list of the screening recommended for you and due dates:  Health Maintenance  Topic Date Due  . Tetanus Vaccine  10/27/1959  . Flu Shot  01/14/2018  . Pneumonia vaccines  Completed    Prevention of falls: Remove rugs or any tripping hazards in the home Use Non slip mats in bathtubs and showers Placing grab bars next to the toilet and or shower Placing handrails on both sides of the stair way Adding extra lighting in the home.   Personal safety issues reviewed:  1. Consider starting a community watch program per [facility] 2.  Changes batteries is smoke detector and/or carbon monoxide detector  3.  If you have firearms; keep them in a safe place 4.  Wear protection when in  the sun; Always wear sunscreen or a hat; It is good to have your doctor check your skin annually or review any new areas of concern 5. Driving safety; Keep in the right lane; stay 3 car lengths behind the car in front of you on the highway; look 3 times prior to pulling out; carry your cell phone everywhere you go!   Health Maintenance, Male A healthy lifestyle and preventive care is important for your health and wellness. Ask your health care provider about what schedule of regular examinations is right for you. What should I know about weight and diet? Eat a Healthy Diet  Eat plenty of vegetables, fruits, whole grains, low-fat dairy products, and lean protein.  Do not eat a lot of foods high in solid fats, added sugars, or salt.  Maintain a Healthy Weight Regular exercise can help you achieve or maintain a healthy weight. You should:  Do at least 150 minutes of exercise each week. The exercise should increase your heart rate and make you sweat (moderate-intensity exercise).  Do strength-training exercises at least twice a week.  Watch Your Levels of Cholesterol and Blood Lipids  Have your blood tested for lipids and cholesterol every 5 years starting at 77 years of age. If you are at high  risk for heart disease, you should start having your blood tested when you are 77 years old. You may need to have your cholesterol levels checked more often if: ? Your lipid or cholesterol levels are high. ? You are older than 77 years of age. ? You are at high risk for heart disease.  What should I know about cancer screening? Many types of cancers can be detected early and may often be prevented. Lung Cancer  You should be screened every year for lung cancer if: ? You are a current smoker who has smoked for at least 30 years. ? You are a former smoker who has quit within the past 15 years.  Talk to your health care provider about your screening options, when you should start screening, and how  often you should be screened.  Colorectal Cancer  Routine colorectal cancer screening usually begins at 77 years of age and should be repeated every 5-10 years until you are 77 years old. You may need to be screened more often if early forms of precancerous polyps or small growths are found. Your health care provider may recommend screening at an earlier age if you have risk factors for colon cancer.  Your health care provider may recommend using home test kits to check for hidden blood in the stool.  A small camera at the end of a tube can be used to examine your colon (sigmoidoscopy or colonoscopy). This checks for the earliest forms of colorectal cancer.  Prostate and Testicular Cancer  Depending on your age and overall health, your health care provider may do certain tests to screen for prostate and testicular cancer.  Talk to your health care provider about any symptoms or concerns you have about testicular or prostate cancer.  Skin Cancer  Check your skin from head to toe regularly.  Tell your health care provider about any new moles or changes in moles, especially if: ? There is a change in a mole's size, shape, or color. ? You have a mole that is larger than a pencil eraser.  Always use sunscreen. Apply sunscreen liberally and repeat throughout the day.  Protect yourself by wearing long sleeves, pants, a wide-brimmed hat, and sunglasses when outside.  What should I know about heart disease, diabetes, and high blood pressure?  If you are 4918-77 years of age, have your blood pressure checked every 3-5 years. If you are 77 years of age or older, have your blood pressure checked every year. You should have your blood pressure measured twice-once when you are at a hospital or clinic, and once when you are not at a hospital or clinic. Record the average of the two measurements. To check your blood pressure when you are not at a hospital or clinic, you can use: ? An automated blood  pressure machine at a pharmacy. ? A home blood pressure monitor.  Talk to your health care provider about your target blood pressure.  If you are between 4245-77 years old, ask your health care provider if you should take aspirin to prevent heart disease.  Have regular diabetes screenings by checking your fasting blood sugar level. ? If you are at a normal weight and have a low risk for diabetes, have this test once every three years after the age of 77. ? If you are overweight and have a high risk for diabetes, consider being tested at a younger age or more often.  A one-time screening for abdominal aortic aneurysm (AAA) by ultrasound is  recommended for men aged 22-75 years who are current or former smokers. What should I know about preventing infection? Hepatitis B If you have a higher risk for hepatitis B, you should be screened for this virus. Talk with your health care provider to find out if you are at risk for hepatitis B infection. Hepatitis C Blood testing is recommended for:  Everyone born from [birth year] through 1965.  Anyone with known risk factors for hepatitis C.  Sexually Transmitted Diseases (STDs)  You should be screened each year for STDs including gonorrhea and chlamydia if: ? You are sexually active and are younger than 77 years of age. ? You are older than 78 years of age and your health care provider tells you that you are at risk for this type of infection. ? Your sexual activity has changed since you were last screened and you are at an increased risk for chlamydia or gonorrhea. Ask your health care provider if you are at risk.  Talk with your health care provider about whether you are at high risk of being infected with HIV. Your health care provider may recommend a prescription medicine to help prevent HIV infection.  What else can I do?  Schedule regular health, dental, and eye exams.  Stay current with your vaccines (immunizations).  Do not use any tobacco  products, such as cigarettes, chewing tobacco, and e-cigarettes. If you need help quitting, ask your health care provider.  Limit alcohol intake to no more than 2 drinks per day. One drink equals 12 ounces of beer, 5 ounces of wine, or 1 ounces of hard liquor.  Do not use street drugs.  Do not share needles.  Ask your health care provider for help if you need support or information about quitting drugs.  Tell your health care provider if you often feel depressed.  Tell your health care provider if you have ever been abused or do not feel safe at home. This information is not intended to replace advice given to you by your health care provider. Make sure you discuss any questions you have with your health care provider. Document Released: 11/29/2007 Document Revised: 01/30/2016 Document Reviewed: 03/06/2015 Elsevier Interactive Patient Education  [employer].

## 2017-12-03 NOTE — Progress Notes (Signed)
I have reviewed and agree with note, evaluation, plan.   Aryannah Mohon, MD  

## 2017-12-18 ENCOUNTER — Other Ambulatory Visit (INDEPENDENT_AMBULATORY_CARE_PROVIDER_SITE_OTHER): Payer: Self-pay | Admitting: Specialist

## 2017-12-18 NOTE — Telephone Encounter (Signed)
meloxicam refill request 

## 2017-12-27 NOTE — Progress Notes (Signed)
@Patient  ID: Jonathon [name], male    DOB: Oct 08, 1940, 77 y.o.   MRN: [id number]  Chief Complaint  Patient presents with  . Follow-up    1 year f/u, states he has been well. No new concerns. CT in march aware of results.     Referring provider: Shelva Majestic, MD  HPI: 77 year old current every day smoker initially referred to our office for abnormal screening of CT scan.  Patient has extensive smoking history started smoking at 77 years old.  More than 50 pack years.  Still smoking 0.5 packs a day. Pt of Dr. Vassie Loll.  Recent [facility] Encounters:   12/26/2016-office visit- [facility] Follow-up regarding CT scan and PET scan.  Lung nodules are either stable or have decreased.  Right hilar lymphadenopathy stable but at 9 mm left hilar lymph nodes appear calcified. Plan: Follow-up in 1 year with CT without contrast, recent CT results favoring benign etiology with old granulomatous disease   Tests:  09/23/2016-spirometry-no evidence of airway obstruction with a ratio of 99 and FEV1 of 72% and FVC of 52% suggesting moderate restriction   Imaging:  09/11/2017-CT chest lung cancer screening- lung RADS 2, benign appearance or behavior, continue annual screening with low-dose CT without contrast in 12 months, left hilar and infra hilar nodes are likely related to old granulomatous disease.  No mediastinal or definite hilar adenopathy, given limitations of unenhanced CT, mild central lobar emphysema, calcified noncalcified pulmonary nodules are similar including a pleural-based right lower lobe nodule which measures 16.2 mm no suspicious or enlarging pulmonary nodules identified 12/23/2016-CT chest with contrast- borderline enlarged right hilar lymph node measuring 9 mm, previously noted pulmonary nodules appear stable or slightly smaller than prior examinations, likely benign, patient should return to routine low-dose CT screening in March/2019 09/22/2016-PET scan- minimal metabolic activity of  subpleural nodule in right lower lobe favors benign inflammation or infection, scattered small subpleural nodules without metabolic activity, hypermetabolic right hilar lymph node with differential including inflammatory node versus metastatic lymph node, asymmetry is concerning, recommend short-term CT follow-up with contrast 09/10/2016-CT chest lung cancer screening- scattered pulmonary nodules measuring up to 16.1 mm in subpleural right lower lobe, lung RADS category 4B suspicious  Cardiac:   Labs:   Micro:   Chart Review:    12/28/17 OV  77 year old patient seen office today for follow-up.  Patient has continued to work on stopping smoking.  Patient is down to smoking a few drags randomly throughout the day.  This is an improvement from last year with respect to half a pack a day.  Patient did complete March/2019 low-dose CT screening which was a lung RADS 2 which is an improvement from last years lung RADS 4B.  Patient reports he is doing well today with no complaints or issues.  Patient denies hemoptysis or recent weight loss.  Patient reports he is been using his CPAP which is managed by the [place].  Patient reports that sometimes he has issues with dry mouth, and this disrupts his use.   Allergies  Allergen Reactions  . Penicillins Rash    Has patient had a PCN reaction causing immediate rash, facial/tongue/throat swelling, SOB or lightheadedness with hypotension: no Has patient had a PCN reaction causing severe rash involving mucus membranes or skin necrosis:yes Has patient had a PCN reaction that required hospitalization: no Has patient had a PCN reaction occurring within the last 10 years: no If all of the above answers are "NO", then may proceed with Cephalosporin use.  Immunization History  Administered Date(s) Administered  . Influenza, High Dose Seasonal PF 05/18/2015, 03/18/2016, 03/26/2017  . Influenza-Unspecified 05/18/2015, 03/18/2016, 04/30/2016  . Pneumococcal  Conjugate-13 01/31/2014  . Pneumococcal Polysaccharide-23 12/03/2017  . Zoster 07/06/2013    Past Medical History:  Diagnosis Date  . Anxiety    never tried on SSRI. xanax 0.5mg  BID through Dr. Doristine Counter.   . DDD (degenerative disc disease), lumbar    lumbar DDD- follows with orthopedist Dr. Leron Croak ortho. mobic 15mg  daily and hydrocodone 5/325 prn  . Family history of breast cancer   . Macular degeneration 07/21/2016   Follows with optho- areds2. He states he is unsure of diagnosis.   . Neuropathy    unknown cause ? back related(sees ortho for back). burning sensation in feet. gabapentin 100mg  after dinner, 2 before bed  . Smoker    8-10 cigarettes a day. plus nicorette. 60 years. over 50 pack years  . Venous stasis     in the past- Wound center on legs. Elevate. Compression stockings.     Tobacco History: Social History   Tobacco Use  Smoking Status Current Every Day Smoker  . Packs/day: 1.00  . Years: 59.00  . Pack years: 59.00  . Types: Cigarettes  Smokeless Tobacco Never Used  Tobacco Comment   wants to continue to cut back   Ready to quit: Yes Counseling given: Not Answered Comment: wants to continue to cut back  Pt to continue to work on reducing to quit.  Encourage patient to contact our office if he is having difficulty stopping smoking.  Praised him for working to stop smoking.  Outpatient Encounter Medications as of 12/28/2017  Medication Sig  . ALPRAZolam (XANAX) 0.5 MG tablet Take 1 tablet (0.5 mg total) by mouth 2 (two) times daily as needed for anxiety (1 refill per month).  Marland Kitchen aspirin EC 81 MG tablet Take 81 mg by mouth daily.  Marland Kitchen atorvastatin (LIPITOR) 20 MG tablet Take 1 tablet (20 mg total) by mouth daily.  Marland Kitchen gabapentin (NEURONTIN) 400 MG capsule TAKE 1 CAPSULE MIDDAY AND 2 CAPSULES AT BEDTIME.  Marland Kitchen HYDROcodone-acetaminophen (NORCO/VICODIN) 5-325 MG tablet Take 1 tablet by mouth every 6 (six) hours as needed for moderate pain.  Marland Kitchen ketoconazole (NIZORAL)  2 % cream Apply 1 application topically daily. For jock itch up to 2 weeks  . meloxicam (MOBIC) 15 MG tablet TAKE 1 TABLET BY MOUTH EVERY DAY WITH EVENING MEAL  . Multiple Vitamins-Minerals (PRESERVISION AREDS 2 PO) Take 1 tablet by mouth daily.  . sertraline (ZOLOFT) 100 MG tablet Take 1.5 tablets (150 mg total) daily by mouth.  . [DISCONTINUED] NON FORMULARY    No facility-administered encounter medications on file as of 12/28/2017.      Review of Systems  Review of Systems  Constitutional: Negative for activity change, chills, fatigue, fever and unexpected weight change.  HENT: Negative for postnasal drip, rhinorrhea, sinus pressure, sinus pain, sneezing and sore throat.   Respiratory: Negative for cough, shortness of breath and wheezing.        Denies hemoptysis   Cardiovascular: Negative for chest pain and palpitations.  Gastrointestinal: Negative for constipation, diarrhea, nausea and vomiting.  Genitourinary: Negative for hematuria and urgency.  Musculoskeletal: Negative for arthralgias.  Skin: Negative for color change.  Neurological: Negative for dizziness, seizures and headaches.  Psychiatric/Behavioral: Negative for dysphoric mood. The patient is not nervous/anxious.   All other systems reviewed and are negative.    Physical Exam  BP 114/70   Pulse  72   Ht 5\' 11"  (1.803 m)   Wt 216 lb 12.8 oz (98.3 kg)   SpO2 97%   BMI 30.24 kg/m   Wt Readings from Last 5 Encounters:  12/28/17 216 lb 12.8 oz (98.3 kg)  12/03/17 214 lb (97.1 kg)  12/03/17 214 lb 12.8 oz (97.4 kg)  07/31/17 212 lb 12.8 oz (96.5 kg)  04/30/17 211 lb 12.8 oz (96.1 kg)     Physical Exam  Constitutional: He is oriented to person, place, and time and well-developed, well-nourished, and in no distress. No distress.  HENT:  Head: Normocephalic and atraumatic.  Right Ear: Hearing, tympanic membrane, external ear and ear canal normal.  Left Ear: Hearing, tympanic membrane, external ear and ear canal  normal.  Nose: Nose normal. Right sinus exhibits no maxillary sinus tenderness and no frontal sinus tenderness. Left sinus exhibits no maxillary sinus tenderness and no frontal sinus tenderness.  Mouth/Throat: Uvula is midline and oropharynx is clear and moist. No oropharyngeal exudate.  Eyes: Pupils are equal, round, and reactive to light.  Neck: Normal range of motion. Neck supple. No JVD present.  Cardiovascular: Normal rate, regular rhythm and normal heart sounds.  Pulmonary/Chest: Effort normal and breath sounds normal. No accessory muscle usage. No respiratory distress. He has no decreased breath sounds. He has no wheezes. He has no rhonchi.  Abdominal: Soft. Bowel sounds are normal. There is no tenderness.  Musculoskeletal: Normal range of motion. He exhibits no edema.  Lymphadenopathy:    He has no cervical adenopathy.  Neurological: He is alert and oriented to person, place, and time. Gait normal.  Skin: Skin is warm and dry. He is not diaphoretic. No erythema.  Psychiatric: Mood, memory, affect and judgment normal.  Nursing note and vitals reviewed.    Lab Results:  CBC    Component Value Date/Time   WBC 4.6 11/11/2016 0816   RBC 4.58 11/11/2016 0816   HGB 14.4 11/11/2016 0816   HCT 42.9 11/11/2016 0816   PLT 233.0 11/11/2016 0816   MCV 93.7 11/11/2016 0816   MCH 32.5 05/28/2016 1646   MCHC 33.6 11/11/2016 0816   RDW 13.8 11/11/2016 0816   LYMPHSABS 1.1 11/11/2016 0816   MONOABS 0.4 11/11/2016 0816   EOSABS 0.4 11/11/2016 0816   BASOSABS 0.1 11/11/2016 0816    BMET    Component Value Date/Time   NA 138 12/03/2017 1005   K 4.2 12/03/2017 1005   CL 103 12/03/2017 1005   CO2 28 12/03/2017 1005   GLUCOSE 83 12/03/2017 1005   BUN 13 12/03/2017 1005   CREATININE 1.02 12/03/2017 1005   CALCIUM 9.4 12/03/2017 1005    BNP No results found for: BNP  ProBNP No results found for: PROBNP  Imaging: No results found.   Assessment & Plan:   Pleasant 77 year old  patient seen in office.  Encourage patient to continue CPAP use.  [facility].  Encourage patient to stop smoking.  Patient to continue low-dose CT screening in March/2020.  Cigarette smoker  [facility] [place] [place].  [facility].   We recommend you stop smoking:  1 800 QUIT NOW  >>> Patient to call this resource and utilize it to help support her quit smoking >>> Keep up your hard work with stopping smoking  You can also contact the [facility] >>>For smoking  cessation classes call [phone]  We do not recommend using e-cigarettes as a form of stopping smoking  Complete low-dose CT screening in March/2020  Follow-up in 1 year, or sooner if respiratory symptoms worsen   Pulmonary nodules   We recommend you stop smoking:  1 800 QUIT NOW  >>> Patient to call this resource and utilize it to help support her quit smoking >>> Keep up your hard work with stopping smoking  You can also contact the [facility] >>>For smoking cessation classes call [phone]  We do not recommend using e-cigarettes as a form of stopping smoking  Complete low-dose CT screening in March/2020  Follow-up in 1 year, or sooner if respiratory symptoms worsen   OSA on CPAP You can try to use Biotene mouthwash to help with dry mouth.   CONTINUE CPAP USE:  . Keep up the hard work using your device.  . Do not drive or operate heavy machinery if tired or drowsy.  . Please notify the supply company and office if you are unable to use your device regularly due to missing supplies or machine being broken.  . Work on maintaining a healthy weight and following your recommended nutrition plan  . Maintain proper daily exercise and movement  . Maintaining proper use of your device can also help improve management of other chronic illnesses  such as: Blood pressure, blood sugars, and weight management.    CPAP Cleaning:  Clean weekly, with Dawn soap, and bottle brush.  Set up to air dry.  Follow with PCP and Cardiology at [facility]. [place].  [facility] Follow up.   Follow-up in 1 year, or sooner if respiratory symptoms worsen      Coral Ceo, NP 12/28/2017

## 2017-12-28 ENCOUNTER — Ambulatory Visit: Payer: Medicare Other | Admitting: Adult Health

## 2017-12-28 ENCOUNTER — Encounter: Payer: Self-pay | Admitting: Pulmonary Disease

## 2017-12-28 ENCOUNTER — Ambulatory Visit (INDEPENDENT_AMBULATORY_CARE_PROVIDER_SITE_OTHER): Payer: Medicare Other | Admitting: Pulmonary Disease

## 2017-12-28 VITALS — BP 114/70 | HR 72 | Ht 71.0 in | Wt 216.8 lb

## 2017-12-28 DIAGNOSIS — R918 Other nonspecific abnormal finding of lung field: Secondary | ICD-10-CM

## 2017-12-28 DIAGNOSIS — F17210 Nicotine dependence, cigarettes, uncomplicated: Secondary | ICD-10-CM

## 2017-12-28 DIAGNOSIS — Z720 Tobacco use: Secondary | ICD-10-CM

## 2017-12-28 DIAGNOSIS — G4733 Obstructive sleep apnea (adult) (pediatric): Secondary | ICD-10-CM

## 2017-12-28 DIAGNOSIS — Z9989 Dependence on other enabling machines and devices: Secondary | ICD-10-CM

## 2017-12-28 DIAGNOSIS — R599 Enlarged lymph nodes, unspecified: Secondary | ICD-10-CM

## 2017-12-28 NOTE — Patient Instructions (Addendum)
You can try to use Biotene mouthwash to help with dry mouth.   CONTINUE CPAP USE:  . Keep up the hard work using your device.  . Do not drive or operate heavy machinery if tired or drowsy.  . Please notify the supply company and office if you are unable to use your device regularly due to missing supplies or machine being broken.  . Work on maintaining a healthy weight and following your recommended nutrition plan  . Maintain proper daily exercise and movement  . Maintaining proper use of your device can also help improve management of other chronic illnesses such as: Blood pressure, blood sugars, and weight management.    CPAP Cleaning:  Clean weekly, with Dawn soap, and bottle brush.  Set up to air dry.  [name] [place] [place].  [facility] Follow up.   We recommend you stop smoking:  1 800 QUIT NOW  >>> Patient to call this resource and utilize it to help support her quit smoking >>> Keep up your hard work with stopping smoking  You can also contact the [facility] >>>For smoking cessation classes call [phone]  We do not recommend using e-cigarettes as a form of stopping smoking  Complete low-dose CT screening in March/2020  Follow-up in 1 year, or sooner if respiratory symptoms worsen   Please contact the office if your symptoms worsen or you have concerns that you are not improving.   Thank you for choosing [facility] for your healthcare, and for allowing us to partner with you on your healthcare journey. I am thankful to be able to provide care to you today.   Elisha HeadlandBrian Mack FNP-C

## 2017-12-28 NOTE — Assessment & Plan Note (Signed)
  Follow with PCP and Cardiology at [facility] [place]. .  [facility].   We recommend you stop smoking:  1 800 QUIT NOW  >>> Patient to call this resource and utilize it to help support her quit smoking >>> Keep up your hard work with stopping smoking  You can also contact the [facility] >>>For smoking cessation classes call [phone]  We do not recommend using e-cigarettes as a form of stopping smoking  Complete low-dose CT screening in March/2020  Follow-up in 1 year, or sooner if respiratory symptoms worsen

## 2017-12-28 NOTE — Assessment & Plan Note (Signed)
You can try to use Biotene mouthwash to help with dry mouth.   CONTINUE CPAP USE:  . Keep up the hard work using your device.  . Do not drive or operate heavy machinery if tired or drowsy.  . Please notify the supply company and office if you are unable to use your device regularly due to missing supplies or machine being broken.  . Work on maintaining a healthy weight and following your recommended nutrition plan  . Maintain proper daily exercise and movement  . Maintaining proper use of your device can also help improve management of other chronic illnesses such as: Blood pressure, blood sugars, and weight management.    CPAP Cleaning:  Clean weekly, with Dawn soap, and bottle brush.  Set up to air dry.  Follow [facility] [place] [place].  Make appt to [facility] with [facility] for [facility] / Follow up.   Follow-up in 1 year, or sooner if respiratory symptoms worsen

## 2017-12-28 NOTE — Assessment & Plan Note (Signed)
   We recommend you stop smoking:  1 800 QUIT NOW  >>> Patient to call this resource and utilize it to help support her quit smoking >>> Keep up your hard work with stopping smoking  You can also contact the [facility] >>>For smoking cessation classes call [phone]  We do not recommend using e-cigarettes as a form of stopping smoking  Complete low-dose CT screening in March/2020  Follow-up in 1 year, or sooner if respiratory symptoms worsen

## 2018-01-01 ENCOUNTER — Other Ambulatory Visit: Payer: Self-pay | Admitting: Family Medicine

## 2018-04-12 DIAGNOSIS — H25013 Cortical age-related cataract, bilateral: Secondary | ICD-10-CM | POA: Diagnosis not present

## 2018-04-12 DIAGNOSIS — H353132 Nonexudative age-related macular degeneration, bilateral, intermediate dry stage: Secondary | ICD-10-CM | POA: Diagnosis not present

## 2018-04-12 DIAGNOSIS — H35033 Hypertensive retinopathy, bilateral: Secondary | ICD-10-CM | POA: Diagnosis not present

## 2018-04-12 DIAGNOSIS — H2513 Age-related nuclear cataract, bilateral: Secondary | ICD-10-CM | POA: Diagnosis not present

## 2018-04-23 ENCOUNTER — Other Ambulatory Visit: Payer: Self-pay

## 2018-04-23 MED ORDER — SERTRALINE HCL 100 MG PO TABS: 150.0000 mg | ORAL_TABLET | Freq: Every day | ORAL | 1 refills | Status: DC

## 2018-05-17 ENCOUNTER — Telehealth: Payer: Self-pay | Admitting: *Deleted

## 2018-05-17 NOTE — Telephone Encounter (Signed)
Patient walked into office requesting refill of Alprazolam.

## 2018-05-18 ENCOUNTER — Other Ambulatory Visit: Payer: Self-pay

## 2018-05-18 MED ORDER — ALPRAZOLAM 0.5 MG PO TABS: 0.5000 mg | ORAL_TABLET | Freq: Two times a day (BID) | ORAL | 4 refills | Status: DC | PRN

## 2018-05-18 NOTE — Telephone Encounter (Signed)
Prescription sent to pharmacy as requested.

## 2018-05-28 ENCOUNTER — Encounter: Payer: Self-pay | Admitting: Family Medicine

## 2018-05-28 ENCOUNTER — Ambulatory Visit (INDEPENDENT_AMBULATORY_CARE_PROVIDER_SITE_OTHER): Payer: Medicare Other

## 2018-05-28 ENCOUNTER — Ambulatory Visit (INDEPENDENT_AMBULATORY_CARE_PROVIDER_SITE_OTHER): Payer: Medicare Other | Admitting: Family Medicine

## 2018-05-28 VITALS — BP 108/74 | HR 74 | Temp 98.1°F | Ht 71.0 in | Wt 216.4 lb

## 2018-05-28 DIAGNOSIS — J181 Lobar pneumonia, unspecified organism: Secondary | ICD-10-CM | POA: Diagnosis not present

## 2018-05-28 DIAGNOSIS — J189 Pneumonia, unspecified organism: Secondary | ICD-10-CM

## 2018-05-28 DIAGNOSIS — R05 Cough: Secondary | ICD-10-CM | POA: Diagnosis not present

## 2018-05-28 DIAGNOSIS — R059 Cough, unspecified: Secondary | ICD-10-CM

## 2018-05-28 DIAGNOSIS — R0989 Other specified symptoms and signs involving the circulatory and respiratory systems: Secondary | ICD-10-CM | POA: Diagnosis not present

## 2018-05-28 MED ORDER — AZITHROMYCIN 250 MG PO TABS: ORAL_TABLET | ORAL | 0 refills | Status: DC

## 2018-05-28 MED ORDER — CEFDINIR 300 MG PO CAPS: 300.0000 mg | ORAL_CAPSULE | Freq: Two times a day (BID) | ORAL | 0 refills | Status: DC

## 2018-05-28 NOTE — Patient Instructions (Signed)
Sending in 2 antibiotics since you are older than 65 years and a smoker 1) zpack (hold cholesterol drug) 2) omnicef: will take one pill twice a day for 10 days.   Robitussin DM during the day for cough Cool mist humidifier at night  Honey is great for cough. 1 tablespoon daily  nyquil at night.   Community-Acquired Pneumonia, Adult Pneumonia is an infection of the lungs. One type of pneumonia can happen while a person is in a hospital. A different type can happen when a person is not in a hospital (community-acquired pneumonia). It is easy for this kind to spread from person to person. It can spread to you if you breathe near an infected person who coughs or sneezes. Some symptoms include:  A dry cough.  A wet (productive) cough.  Fever.  Sweating.  Chest pain.  Follow these instructions at home:  Take over-the-counter and prescription medicines only as told by your doctor. ? Only take cough medicine if you are losing sleep. ? If you were prescribed an antibiotic medicine, take it as told by your doctor. Do not stop taking the antibiotic even if you start to feel better.  Sleep with your head and neck raised (elevated). You can do this by putting a few pillows under your head, or you can sleep in a recliner.  Do not use tobacco products. These include cigarettes, chewing tobacco, and e-cigarettes. If you need help quitting, ask your doctor.  Drink enough water to keep your pee (urine) clear or pale yellow. A shot (vaccine) can help prevent pneumonia. Shots are often suggested for:  People older than 77 years of age.  People older than 77 years of age: ? Who are having cancer treatment. ? Who have long-term (chronic) lung disease. ? Who have problems with their body's defense system (immune system).  You may also prevent pneumonia if you take these actions:  Get the flu (influenza) shot every year.  Go to the dentist as often as told.  Wash your hands often. If soap and  water are not available, use hand sanitizer.  Contact a doctor if:  You have a fever.  You lose sleep because your cough medicine does not help. Get help right away if:  You are short of breath and it gets worse.  You have more chest pain.  Your sickness gets worse. This is very serious if: ? You are an older adult. ? Your body's defense system is weak.  You cough up blood. This information is not intended to replace advice given to you by your health care provider. Make sure you discuss any questions you have with your health care provider. Document Released: 11/19/2007 Document Revised: 11/08/2015 Document Reviewed: 09/27/2014 Elsevier Interactive Patient Education  [employer]2018 Elsevier Inc.

## 2018-05-28 NOTE — Progress Notes (Signed)
Patient: Jonathon [name] MRN: [id number] DOB: 1940-11-05 PCP: Shelva Majestic, MD     Subjective:  Chief Complaint  Patient presents with  . Cough  . Nasal Congestion    HPI: The patient is a 77 y.o. male who presents today for cough/congestion/ URI that started about 2 weeks ago and has gradually gotten worse. His son was diagnosed with bronchitis and he decided to come in and get checked out. He denies any sinus pain/pressure or ear pain. No fever/chills. His cough is productive with thick mucous and he has a wheeze. He denies any shortness of breath. He has no asthma, but is smoker. He has been around other sick contacts. He has taken mucinex, tylenol. No other otc medications have been tried.   Review of Systems  Constitutional: Negative for chills, fatigue and fever.  HENT: Positive for congestion, rhinorrhea and sinus pressure. Negative for ear pain, sinus pain and sore throat.   Respiratory: Positive for cough and wheezing. Negative for chest tightness and shortness of breath.   Cardiovascular: Negative for chest pain.  Gastrointestinal: Negative for abdominal pain and nausea.  Musculoskeletal: Negative for back pain and neck pain.  Neurological: Positive for headaches. Negative for dizziness.    Allergies Patient is allergic to penicillins.  Past Medical History Patient  has a past medical history of Anxiety, DDD (degenerative disc disease), lumbar, Family history of breast cancer, Macular degeneration (07/21/2016), Neuropathy, Smoker, and Venous stasis.  Surgical History Patient  has a past surgical history that includes Esophagogastroduodenoscopy (Left, 05/28/2016).  Family History Pateint's family history includes Alcohol abuse in his brother and father; Breast cancer in his maternal aunt and sister; Breast cancer (age of onset: 53) in an other family member; Cancer in his sister; Colon cancer in his maternal uncle; Dementia in his mother; Other in his mother and  sister.  Social History Patient  reports that he has been smoking cigarettes. He has a 59.00 pack-year smoking history. He has never used smokeless tobacco. He reports current alcohol use. He reports that he does not use drugs.    Objective: Vitals:   05/28/18 1433  BP: 108/74  Pulse: 74  Temp: 98.1 F (36.7 C)  TempSrc: Oral  SpO2: 96%  Weight: 216 lb 6.4 oz (98.2 kg)  Height: 5\' 11"  (1.803 m)    Body mass index is 30.18 kg/m.  Physical Exam Vitals signs reviewed.  Constitutional:      Appearance: He is normal weight.  HENT:     Right Ear: Tympanic membrane and ear canal normal.     Left Ear: Tympanic membrane and ear canal normal.     Nose: Nose normal.  Neck:     Musculoskeletal: Normal range of motion and neck supple.  Cardiovascular:     Rate and Rhythm: Normal rate and regular rhythm.     Heart sounds: Normal heart sounds.  Pulmonary:     Effort: Pulmonary effort is normal.     Breath sounds: Rales (RLL) present.  Abdominal:     General: Abdomen is flat. Bowel sounds are normal.     Palpations: Abdomen is soft.  Lymphadenopathy:     Cervical: No cervical adenopathy.  Neurological:     Mental Status: He is alert.    CXR: consolidation in RLL. Nodule in LUL. official read pending     Assessment/plan: 1. Pneumonia of right lower lobe due to infectious organism [facility]) Consolidation on CXR. omnicef and zpack due to age/smoker. Hold statin while on zpack.  Renal fx wnl. Discussed conservative therapy with fluids, honey, robitussin DM, nyquil. Precautions given for fever/worsening symptoms/shortness of breath ER. Repeat CXR in 4-6 weeks.  - DG Chest 2 View; Future   Return if symptoms worsen or fail to improve.   Orland MustardAllison Atisha Hamidi, MD [facility]   05/28/2018

## 2018-06-04 ENCOUNTER — Ambulatory Visit (INDEPENDENT_AMBULATORY_CARE_PROVIDER_SITE_OTHER): Payer: Medicare Other

## 2018-06-04 DIAGNOSIS — Z23 Encounter for immunization: Secondary | ICD-10-CM

## 2018-07-15 ENCOUNTER — Other Ambulatory Visit: Payer: Self-pay | Admitting: Family Medicine

## 2018-08-04 ENCOUNTER — Other Ambulatory Visit: Payer: Self-pay | Admitting: Family Medicine

## 2018-08-04 NOTE — Telephone Encounter (Signed)
Copied from CRM [id number]. Topic: Quick Communication - Rx Refill/Question >> Aug 04, 2018  4:40 PM Wyonia Hough E wrote: Medication: ALPRAZolam Prudy Feeler) 0.5 MG tablet  Has the patient contacted their pharmacy? No    Preferred Pharmacy (with phone number or street name): [id number] - [place], [place] - [address] [phone] (Phone) [phone] (Fax)    Agent: Please be advised that RX refills may take up to 3 business days. We ask that you follow-up with your pharmacy.

## 2018-08-04 NOTE — Telephone Encounter (Signed)
See note

## 2018-08-05 NOTE — Telephone Encounter (Signed)
He should be good through end of April per my calculations- why does he need early refill?

## 2018-08-05 NOTE — Telephone Encounter (Signed)
Okay for refill? Pt has an ov 08/27/2018  Please advise

## 2018-08-06 NOTE — Telephone Encounter (Signed)
Called pt no vm set up at the time.

## 2018-08-09 NOTE — Telephone Encounter (Signed)
Rx refill refused

## 2018-08-27 ENCOUNTER — Other Ambulatory Visit: Payer: Self-pay

## 2018-08-27 ENCOUNTER — Encounter: Payer: Self-pay | Admitting: Family Medicine

## 2018-08-27 ENCOUNTER — Ambulatory Visit (INDEPENDENT_AMBULATORY_CARE_PROVIDER_SITE_OTHER): Payer: Medicare Other | Admitting: Family Medicine

## 2018-08-27 VITALS — BP 124/74 | HR 78 | Temp 97.9°F | Ht 71.0 in | Wt 216.8 lb

## 2018-08-27 DIAGNOSIS — F17210 Nicotine dependence, cigarettes, uncomplicated: Secondary | ICD-10-CM

## 2018-08-27 DIAGNOSIS — F411 Generalized anxiety disorder: Secondary | ICD-10-CM

## 2018-08-27 DIAGNOSIS — G629 Polyneuropathy, unspecified: Secondary | ICD-10-CM | POA: Diagnosis not present

## 2018-08-27 DIAGNOSIS — E785 Hyperlipidemia, unspecified: Secondary | ICD-10-CM | POA: Diagnosis not present

## 2018-08-27 LAB — COMPREHENSIVE METABOLIC PANEL
ALT: 11 U/L (ref 0–53)
AST: 14 U/L (ref 0–37)
Albumin: 4 g/dL (ref 3.5–5.2)
Alkaline Phosphatase: 82 U/L (ref 39–117)
BUN: 16 mg/dL (ref 6–23)
CO2: 27 mEq/L (ref 19–32)
Calcium: 8.8 mg/dL (ref 8.4–10.5)
Chloride: 102 mEq/L (ref 96–112)
Creatinine, Ser: 1.07 mg/dL (ref 0.40–1.50)
GFR: 66.87 mL/min (ref >60.00)
Glucose, Bld: 134 mg/dL — ABNORMAL HIGH (ref 70–99)
POTASSIUM: 4 meq/L (ref 3.5–5.1)
Sodium: 138 mEq/L (ref 135–145)
Total Bilirubin: 0.8 mg/dL (ref 0.2–1.2)
Total Protein: 6.5 g/dL (ref 6.0–8.3)

## 2018-08-27 LAB — POC URINALSYSI DIPSTICK (AUTOMATED)
Bilirubin, UA: NEGATIVE
Blood, UA: NEGATIVE
Glucose, UA: NEGATIVE
Ketones, UA: NEGATIVE
Leukocytes, UA: NEGATIVE
Nitrite, UA: NEGATIVE
Protein, UA: NEGATIVE
Spec Grav, UA: 1.015 (ref 1.010–1.025)
Urobilinogen, UA: 1 E.U./dL
pH, UA: 6 (ref 5.0–8.0)

## 2018-08-27 LAB — CBC
HCT: 39.5 % (ref 39.0–52.0)
Hemoglobin: 13.4 g/dL (ref 13.0–17.0)
MCHC: 34 g/dL (ref 30.0–36.0)
MCV: 94.2 fl (ref 78.0–100.0)
Platelets: 201 10*3/uL (ref 150.0–400.0)
RBC: 4.19 Mil/uL — AB (ref 4.22–5.81)
RDW: 14.5 % (ref 11.5–15.5)
WBC: 5 10*3/uL (ref 4.0–10.5)

## 2018-08-27 LAB — LIPID PANEL
Cholesterol: 128 mg/dL (ref 0–200)
HDL: 46.7 mg/dL (ref >39.00)
LDL Cholesterol: 64 mg/dL (ref 0–99)
NonHDL: 81.34
Total CHOL/HDL Ratio: 3
Triglycerides: 88 mg/dL (ref 0.0–149.0)
VLDL: 17.6 mg/dL (ref 0.0–40.0)

## 2018-08-27 MED ORDER — SERTRALINE HCL 100 MG PO TABS: 150.0000 mg | ORAL_TABLET | Freq: Every day | ORAL | 3 refills | Status: DC

## 2018-08-27 MED ORDER — ALPRAZOLAM 0.5 MG PO TABS: 0.5000 mg | ORAL_TABLET | Freq: Two times a day (BID) | ORAL | 4 refills | Status: DC | PRN

## 2018-08-27 MED ORDER — ATORVASTATIN CALCIUM 20 MG PO TABS: 20.0000 mg | ORAL_TABLET | Freq: Every day | ORAL | 3 refills | Status: DC

## 2018-08-27 NOTE — Progress Notes (Signed)
Phone [phone]   Subjective:  Jonathon [name] is a 78 y.o. year old very pleasant male patient who presents for/with See problem oriented charting ROS- continued neuropathy. Continued anxiety. No reported chest pain. No edema.    Past Medical History-  Patient Active Problem List   Diagnosis Date Noted  . GAD (generalized anxiety disorder)     Priority: High  . Cigarette smoker     Priority: High  . OSA on CPAP 12/03/2017    Priority: Medium  . Hyperlipidemia 01/28/2017    Priority: Medium  . Pulmonary nodules 12/26/2016    Priority: Medium  . Aortic atherosclerosis (HCC) 10/28/2016    Priority: Medium  . Neuropathy     Priority: Medium  . DDD (degenerative disc disease), lumbar     Priority: Medium  . Genetic testing 10/14/2016    Priority: Low  . Family history of colon cancer 10/02/2016    Priority: Low  . Family history of breast cancer     Priority: Low  . Hilar lymphadenopathy 09/23/2016    Priority: Low  . Lymph nodes enlarged 09/23/2016    Priority: Low  . Family history of genetic disease 08/28/2016    Priority: Low  . Macular degeneration 07/21/2016    Priority: Low  . Jock itch 07/31/2017    Medications- reviewed and updated Current Outpatient Medications  Medication Sig Dispense Refill  . ALPRAZolam (XANAX) 0.5 MG tablet Take 1 tablet (0.5 mg total) by mouth 2 (two) times daily as needed for anxiety (1 refill per month). 35 tablet 4  . aspirin EC 81 MG tablet Take 81 mg by mouth daily.    Marland Kitchen atorvastatin (LIPITOR) 20 MG tablet Take 1 tablet (20 mg total) by mouth daily. 90 tablet 3  . gabapentin (NEURONTIN) 400 MG capsule TAKE 1 CAPSULE MIDDAY AND 2 CAPSULES AT BEDTIME. 270 capsule 5  . HYDROcodone-acetaminophen (NORCO/VICODIN) 5-325 MG tablet Take 1 tablet by mouth every 6 (six) hours as needed for moderate pain.    Marland Kitchen ketoconazole (NIZORAL) 2 % cream Apply 1 application topically daily. For jock itch up to 2 weeks 60 g 2  . meloxicam (MOBIC) 15 MG  tablet TAKE 1 TABLET BY MOUTH EVERY DAY WITH EVENING MEAL 90 tablet 3  . Multiple Vitamins-Minerals (PRESERVISION AREDS 2 PO) Take 1 tablet by mouth daily.    . sertraline (ZOLOFT) 100 MG tablet Take 1.5 tablets (150 mg total) by mouth daily. 135 tablet 3   No current facility-administered medications for this visit.      Objective:  BP 124/74 (BP Location: Left Arm, Patient Position: Sitting, Cuff Size: Normal)   Pulse 78   Temp 97.9 F (36.6 C) (Oral)   Ht 5\' 11"  (1.803 m)   Wt 216 lb 12 oz (98.3 kg)   SpO2 96%   BMI 30.23 kg/m  Gen: NAD, resting comfortably, smells of smoke CV: RRR no murmurs rubs or gallops Lungs: CTAB no crackles, wheeze, rhonchi Abdomen: soft/nontender/nondistended Ext: no edema Skin: warm, dry    Assessment and Plan    # GAD S: compliant with zoloft 150mg . Using alprazolpam #35 per month as well A/P: Stable. Continue current medications.   # Smoking S:smoking 8-10 cigs per day. Over 50 pack years.   A/P:  declines cessation- we discussed importance of quitting and how smoking may increase covid-19 risks  # Hyperlipidemia/aortic atherosclerosis S:compliant with atorvastatin 20mg . With smoking history we have opted to continue aspirin as well. Alternatively zoloft increases bleeding risk- if  any future bleeding issues would d/c aspirin Aortic atherosclerosis on prior imaging as well- reasonable for statin A/P: Stable problems x2-continue current medication.  Last LDL 72-update labs today  # Neuropathy  S:continued issues with left foot related to back. Uses gabapentin 400 at lunch and 800 before bed- finds this helpful  A/P:  Stable. Continue current medications.     %-Degenerative disc disease lumbar spine- follows with Dr. Corwin Levins is on Mobic and hydrocodone. - Discussion with patient increase bleeding risk on Lovenox consider reduce dosing if possible  Future Appointments  Date Time Provider Department Center  12/27/2018  8:00 AM Shelva Majestic, MD [facility]   Return in about 4 months (around 12/27/2018) for follow up- or sooner if needed.  Lab/Order associations: half coke and piece of apple pie Hyperlipidemia, unspecified hyperlipidemia type - Plan: CBC, Comprehensive metabolic panel, Lipid panel, POCT Urinalysis Dipstick (Automated), POCT Urinalysis Dipstick (Automated)  GAD (generalized anxiety disorder)  Meds ordered this encounter  Medications  . ALPRAZolam (XANAX) 0.5 MG tablet    Sig: Take 1 tablet (0.5 mg total) by mouth 2 (two) times daily as needed for anxiety (1 refill per month).    Dispense:  35 tablet    Refill:  4  . atorvastatin (LIPITOR) 20 MG tablet    Sig: Take 1 tablet (20 mg total) by mouth daily.    Dispense:  90 tablet    Refill:  3  . sertraline (ZOLOFT) 100 MG tablet    Sig: Take 1.5 tablets (150 mg total) by mouth daily.    Dispense:  135 tablet    Refill:  3   Return precautions advised.  [name], MD

## 2018-08-27 NOTE — Patient Instructions (Addendum)
Please stop by lab before you go If you do not have mychart- we will call you about results within 5 business days of [place] receiving them.  If you have mychart- we will send your results within 3 business days of [place] receiving them.  If abnormal or we want to clarify a result, we will call or mychart you to make sure you receive the message.  If you have questions or concerns or don't hear within 5-7 days, please send [place] a message or call us.   Refilled anxiety medicine for you

## 2018-10-29 ENCOUNTER — Other Ambulatory Visit: Payer: Self-pay | Admitting: Family Medicine

## 2018-12-10 ENCOUNTER — Other Ambulatory Visit (INDEPENDENT_AMBULATORY_CARE_PROVIDER_SITE_OTHER): Payer: Self-pay | Admitting: Specialist

## 2018-12-10 NOTE — Telephone Encounter (Signed)
meloxicam refill request 

## 2018-12-27 ENCOUNTER — Ambulatory Visit (INDEPENDENT_AMBULATORY_CARE_PROVIDER_SITE_OTHER): Payer: Medicare Other | Admitting: Family Medicine

## 2018-12-27 ENCOUNTER — Other Ambulatory Visit: Payer: Self-pay

## 2018-12-27 ENCOUNTER — Encounter: Payer: Self-pay | Admitting: Family Medicine

## 2018-12-27 VITALS — BP 122/82 | HR 65 | Temp 97.7°F | Ht 71.0 in | Wt 214.4 lb

## 2018-12-27 DIAGNOSIS — F17210 Nicotine dependence, cigarettes, uncomplicated: Secondary | ICD-10-CM

## 2018-12-27 DIAGNOSIS — G4733 Obstructive sleep apnea (adult) (pediatric): Secondary | ICD-10-CM

## 2018-12-27 DIAGNOSIS — I700 Atherosclerosis of aorta: Secondary | ICD-10-CM

## 2018-12-27 DIAGNOSIS — Z0000 Encounter for general adult medical examination without abnormal findings: Secondary | ICD-10-CM | POA: Diagnosis not present

## 2018-12-27 DIAGNOSIS — Z1211 Encounter for screening for malignant neoplasm of colon: Secondary | ICD-10-CM

## 2018-12-27 DIAGNOSIS — E663 Overweight: Secondary | ICD-10-CM

## 2018-12-27 DIAGNOSIS — Z9989 Dependence on other enabling machines and devices: Secondary | ICD-10-CM

## 2018-12-27 DIAGNOSIS — E785 Hyperlipidemia, unspecified: Secondary | ICD-10-CM | POA: Diagnosis not present

## 2018-12-27 DIAGNOSIS — F411 Generalized anxiety disorder: Secondary | ICD-10-CM

## 2018-12-27 DIAGNOSIS — R918 Other nonspecific abnormal finding of lung field: Secondary | ICD-10-CM

## 2018-12-27 MED ORDER — ALPRAZOLAM 0.5 MG PO TABS: 0.5000 mg | ORAL_TABLET | Freq: Two times a day (BID) | ORAL | 4 refills | Status: DC | PRN

## 2018-12-27 MED ORDER — KETOCONAZOLE 2 % EX CREA: 1.0000 "application " | TOPICAL_CREAM | Freq: Every day | CUTANEOUS | 2 refills | Status: DC

## 2018-12-27 NOTE — Progress Notes (Signed)
Phone: [phone]   Subjective:  Patient presents today for their annual physical. Chief complaint-noted.   See problem oriented charting- ROS- full  review of systems was completed and negative except for: leg swelling, back pain- follows with Dr. Louanne Skye  The following were reviewed and entered/updated in epic: Past Medical History:  Diagnosis Date   Anxiety    never tried on SSRI. xanax 0.53m BID through Dr. BTollie Pizza    DDD (degenerative disc disease), lumbar    lumbar DDD- follows with orthopedist Dr. NLinus Ornortho. mobic 144mdaily and hydrocodone 5/325 prn   Family history of breast cancer    Macular degeneration 07/21/2016   Follows with optho- areds2. He states he is unsure of diagnosis.    Neuropathy    unknown cause ? back related(sees ortho for back). burning sensation in feet. gabapentin 10019mfter dinner, 2 before bed   Smoker    8-10 cigarettes a day. plus nicorette. 60 years. over 50 pack years   Venous stasis     in the past- Wound center on legs. Elevate. Compression stockings.    Patient Active Problem List   Diagnosis Date Noted   GAD (generalized anxiety disorder)     Priority: High   Cigarette smoker     Priority: High   OSA on CPAP 12/03/2017    Priority: Medium   Hyperlipidemia 01/28/2017    Priority: Medium   Pulmonary nodules 12/26/2016    Priority: Medium   [place]    Priority: Medium   Neuropathy     Priority: Medium   DDD (degenerative disc disease), lumbar     Priority: Medium   Genetic testing 10/14/2016    Priority: Low   Family history of colon cancer 10/02/2016    Priority: Low   Family history of breast cancer     Priority: Low   Hilar lymphadenopathy 09/23/2016    Priority: Low   Lymph nodes enlarged 09/23/2016    Priority: Low   Family history of genetic disease 08/28/2016    Priority: Low   Macular degeneration 07/21/2016    Priority: Low   Jock itch 07/31/2017     Past Surgical History:  Procedure Laterality Date   ESOPHAGOGASTRODUODENOSCOPY Left 05/28/2016    Surgeon: JyoJuanita CraverD; food stuck in esophagus    Family History  Problem Relation Age of Onset   Other Mother        "old age" per patient    Dementia Mother    Alcohol abuse Father        contributed   Cancer Sister        unknown type   Alcohol abuse Brother    Breast cancer Sister        dx in her early 30s37sOther Sister        PALB2+   Breast cancer Maternal Aunt    Colon cancer Maternal Uncle    Breast cancer Other 39    Medications- reviewed and updated Current Outpatient Medications  Medication Sig Dispense Refill   ALPRAZolam (XANAX) 0.5 MG tablet Take 1 tablet (0.5 mg total) by mouth 2 (two) times daily as needed for anxiety (1 refill per month). 35 tablet 4   aspirin EC 81 MG tablet Take 81 mg by mouth daily.     atorvastatin (LIPITOR) 20 MG tablet Take 1 tablet (20 mg total) by mouth daily. 90 tablet 3   gabapentin (NEURONTIN) 400 MG capsule TAKE 1 CAPSULE MIDDAY AND  2 CAPSULES AT BEDTIME. 270 capsule 5   ketoconazole (NIZORAL) 2 % cream Apply 1 application topically daily. For jock itch up to 2 weeks 60 g 2   meloxicam (MOBIC) 15 MG tablet TAKE 1 TABLET BY MOUTH EVERY DAY WITH EVENING MEAL 90 tablet 3   Multiple Vitamins-Minerals (PRESERVISION AREDS 2 PO) Take 1 tablet by mouth daily.     sertraline (ZOLOFT) 100 MG tablet Take 1.5 tablets (150 mg total) by mouth daily. (Patient taking differently: Take 50 mg by mouth daily. ) 135 tablet 3   HYDROcodone-acetaminophen (NORCO/VICODIN) 5-325 MG tablet Take 1 tablet by mouth every 6 (six) hours as needed for moderate pain.     No current facility-administered medications for this visit.   h  Allergies-reviewed and updated Allergies  Allergen Reactions   Penicillins Rash    Has patient had a PCN reaction causing immediate rash, facial/tongue/throat swelling, SOB or lightheadedness with  hypotension: no Has patient had a PCN reaction causing severe rash involving mucus membranes or skin necrosis:yes Has patient had a PCN reaction that required hospitalization: no Has patient had a PCN reaction occurring within the last 10 years: no If all of the above answers are "NO", then may proceed with Cephalosporin use.     Social History   Social History Narrative   Married around 1964. 3 children. 6 grandkids.       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 Oropharynx normal Neck: no thyromegaly CV: RRR no murmurs rubs or gallops Lungs: CTAB no crackles, wheeze, rhonchi Abdomen: soft/nontender/nondistended/normal bowel sounds. No rebound or guarding.  Ext: trace edema and 2+ PT pulses Skin: warm, dry Neuro: grossly normal, moves all extremities, PERRLA    Assessment and Plan  78 y.o. male presenting for annual physical.  Health Maintenance counseling: 1. Anticipatory guidance: Patient counseled regarding regular dental exams -q6 months, eye exams -yearly,  avoiding smoking and second hand smoke- encouraged cessation , limiting alcohol to 2 beverages per day - advised no alcohol within 6 hours of xanax.   2. Risk factor reduction:  Advised patient of need for regular exercise and diet rich and fruits and vegetables to reduce risk of heart attack and stroke. Exercise- recommended 150 minutes a week as long as no chest pain or shortness of breath- he is "trying to move as much as he can"- still working- goes out on service calls but no ladders or under house. Diet-recommended heart healthy diet.  Wt Readings from Last 3 Encounters:  12/27/18 214 lb 6.4 oz (97.3 kg)  08/27/18 216 lb 12 oz  (98.3 kg)  05/28/18 216 lb 6.4 oz (98.2 kg)  3. Immunizations/screenings/ancillary studies-discussed Tdap and Shingrix at pharmacy is covered on part D -he is likely to defer with COVID-19 Immunization History  Administered Date(s) Administered   Influenza, High Dose Seasonal PF 05/18/2015, 03/18/2016, 03/26/2017, 06/04/2018   Influenza-Unspecified 05/18/2015, 03/18/2016, 04/30/2016   Pneumococcal Conjugate-13 01/31/2014   Pneumococcal Polysaccharide-23 12/03/2017   Zoster 07/06/2013  4. Prostate cancer screening-  passed age based screening 5. Colon cancer screening - follows with Dr. Collene Mares- she told him  2022 follow up 6. Skin cancer screening- no dermatologist. advised regular sunscreen use. Denies worrisome, changing, or new skin lesions.  7.  Current smoker-smoking 8 to 10 cigarettes/day still-over 50 pack years.  Declined cessation.  We discussed lung cancer screening-he is already enrolled-program and will get UA at least yearly-completed in March  Status of chronic or acute concerns   #hyperlipidemia/aortic atherosclerosis S:  controlled on Atorvastatin 20 mg daily.  Labs just done in March and well controlled.  Due to patient's long-term smoking history we are also continuing aspirin.  Could DC aspirin if bleeding issues particularly with him also being on Zoloft A/P:  Stable. Continue current medications.    # GAD S:Taking Sertraline 150 mg 1.5 tablets daily in the past but he had to cut to 14m due to dry mouth and Alprazolam 0.5 mg BID prn.  Goal on alprazolam has been to use under number 35/month-he is doing okay with this .  He has had a hard time cutting down below this amount-fortunately no falls. He states no issues on gad7 as long as meds- declined full questionnaire  A/P:   Stable. Continue current medications.  Dry mouth with zoloft 1531m much better on 5053m# Neuropathy S:Taking Gabapentin 400 mg 1 tablet midday and 2 tablets at bedtime.   A/P: Patient continues  to find regimen helpful-continue current medication. Uses ice at times on his feet  % Degenerative disc disease lumbar spine-follow with Dr. NitLouanne SkyeHe is on mobic and hydrocodone. mobic increases bleeding risk  OSA on CPAP - Plan: Continue CPAP  Pulmonary nodules - Plan: Last CT scan was March 2019 with pulmonology- patient is due for one-year follow-up and encouraged him to call to schedule  Jock itch at times- refilled this   Recommended follow up: 4-m61-monthlow-up No future appointments.  Lab/Order associations: already had labs in march other than stool cards- fasting status doesn't matter    ICD-10-CM   1. Preventative health care  Z00.00   2. Hyperlipidemia, unspecified hyperlipidemia type  E78.5   3. GAD (generalized anxiety disorder)  F41.1   4. Cigarette smoker  F17.210   5. OSA on CPAP  G47.33    Z99.89   6. Aortic atherosclerosis (HCC)  I70.0   7. Pulmonary nodules  R91.8   8. Screen for colon cancer  Z12.11 CANCELED: Fecal occult blood, imunochemical  9. Overweight  E66.3     Meds ordered this encounter  Medications   ALPRAZolam (XANAX) 0.5 MG tablet    Sig: Take 1 tablet (0.5 mg total) by mouth 2 (two) times daily as needed for anxiety (1 refill per month).    Dispense:  35 tablet    Refill:  4   ketoconazole (NIZORAL) 2 % cream    Sig: Apply 1 application topically daily. For jock itch up to 2 weeks    Dispense:  60 g    Refill:  2    Return precautions advised.  StepGarret Reddish

## 2018-12-27 NOTE — Patient Instructions (Addendum)
Health Maintenance Due  Topic Date Due  .  You are due for the tetanus shot and Shingrix-consider getting these at your pharmacy after our discussion today 10/27/1959   72-month follow-up recommended  As always- would love for you to quit smoking  Make sure to call pulmonology back as you are due for your follow-up CT scan

## 2018-12-27 NOTE — Assessment & Plan Note (Signed)
S:Taking Sertraline 150 mg 1.5 tablets daily in the past but he had to cut to 50mg  due to dry mouth and Alprazolam 0.5 mg BID prn.  Goal on alprazolam has been to use under number 35/month-he is doing okay with this .  He has had a hard time cutting down below this amount-fortunately no falls A/P:   Stable. Continue current medications.  Dry mouth with zoloft 150mg - much better on 50mg 

## 2019-01-31 ENCOUNTER — Telehealth: Payer: Self-pay | Admitting: Acute Care

## 2019-01-31 NOTE — Telephone Encounter (Signed)
I would have the patient get scheduled for a follow-up visit with Dr. Elsworth Soho.  They can discuss the options of following a CT.  May be a follow-up sometime over the next 6 to 8 weeks.  If there is nothing available with Dr. Elsworth Soho based off of clinic availability then patient should be added to the list to be called when Dr. Elsworth Soho opens up clinic.Based off of patient's last CT screening I do believe it would be reasonable to consider one more CT chest without contrast.  This can be discussed with Dr. Elsworth Soho at an office visit.Wyn Quaker, FNP

## 2019-01-31 NOTE — Telephone Encounter (Signed)
Spoke with pt and scheduled appt with Dr Elsworth Soho for 02/10/19 9:00 to discuss further CT f/u. Pt verbalized understanding.  Nothing further needed.

## 2019-01-31 NOTE — Telephone Encounter (Signed)
Spoke with pt regarding lung cancer screening Ct.  Pt advised that he no longer qualifies under lung screening guidelines due to current age of 35. Pt would like to know if he still needs CT f/u outside of lung cancer screening program. Aaron Edelman can you advise?

## 2019-02-01 NOTE — Telephone Encounter (Signed)
Thank you   B

## 2019-02-10 ENCOUNTER — Encounter: Payer: Self-pay | Admitting: Pulmonary Disease

## 2019-02-10 ENCOUNTER — Ambulatory Visit: Payer: Medicare Other | Admitting: Pulmonary Disease

## 2019-02-10 ENCOUNTER — Other Ambulatory Visit: Payer: Self-pay

## 2019-02-10 DIAGNOSIS — Z23 Encounter for immunization: Secondary | ICD-10-CM

## 2019-02-10 DIAGNOSIS — R918 Other nonspecific abnormal finding of lung field: Secondary | ICD-10-CM

## 2019-02-10 DIAGNOSIS — F17210 Nicotine dependence, cigarettes, uncomplicated: Secondary | ICD-10-CM

## 2019-02-10 DIAGNOSIS — R911 Solitary pulmonary nodule: Secondary | ICD-10-CM | POA: Diagnosis not present

## 2019-02-10 NOTE — Patient Instructions (Signed)
Ct chest without contrast - to follow up on nodules

## 2019-02-10 NOTE — Addendum Note (Signed)
Addended by: Amado Coe on: 02/10/2019 10:00 AM   Modules accepted: Orders

## 2019-02-10 NOTE — Assessment & Plan Note (Signed)
Favor benign nodules, likely old granulomatous disease. We will get 2-year follow-up CT for completion and then stop further imaging if this turns out okay.  Flu shot today

## 2019-02-10 NOTE — Progress Notes (Signed)
   Subjective:    Patient ID: Jonathon [name], male    DOB: 1940-11-03, 78 y.o.   MRN: [id number]  HPI  57 y o smoker initially  referred 2018 for abnormal screening CAT scan, pulmonary nodules and calcified lymphadenopathy with calcified granulomas in the liver and spleen -suggestive of old granulomatous disease He started smoking as a 78 year old,more than 50 pack years -He continues to smoke about half pack per day  Uneventful year for him, denies cough wheezing dyspnea.  Has some back pain and uses a binder Is active and mows his yard. His social distancing and wearing a mask     Significant tests/ events reviewed  Screening CT chest on 09/10/16 >>scattered pulmonary nodules measuring up to 16 mm the subpleural right lower lobe, another 8 mm subpleural nodule in the superior  segment left lower lobe- RADS  4 B. 09/2016 PET scan -minimal metabolic activity in the right subpleural nodule indicating benign etiology. hypermetabolic right hilar lymph node-this lymph node was not noted on the earlier noncontrast CT.There was diffuse activity throughout the esophagus favoring esophagitis  09/2016 Spirometry- no  evidence of airway obstruction with a ratio of 99 an FEV1 of 72% and FVC of 52% suggesting moderate restriction  08/2017  CT scan - lung nodules are either stable or decreased in size, most concerning was pleural based nodule in the periphery of right lower lobe -but even this is marginally decreased. There were calcified granulomas in the liver and spleen Right hilar lymphadenopathy stable but at 9 mm and left hilar lymph nodes appear calcified  Review of Systems   neg for any significant sore throat, dysphagia, itching, sneezing, nasal congestion or excess/ purulent secretions, fever, chills, sweats, unintended wt loss, pleuritic or exertional cp, hempoptysis, orthopnea pnd or change in chronic leg swelling. Also denies presyncope, palpitations, heartburn, abdominal pain,  nausea, vomiting, diarrhea or change in bowel or urinary habits, dysuria,hematuria, rash, arthralgias, visual complaints, headache, numbness weakness or ataxia.     Objective:   Physical Exam   Gen. Pleasant, well-nourished, in no distress ENT - no thrush, no pallor/icterus,no post nasal drip Neck: No JVD, no thyromegaly, no carotid bruits Lungs: no use of accessory muscles, no dullness to percussion, clear without rales or rhonchi  Cardiovascular: Rhythm regular, heart sounds  normal, no murmurs or gallops, 1+ peripheral edema Musculoskeletal: No deformities, no cyanosis or clubbing         Assessment & Plan:

## 2019-02-10 NOTE — Assessment & Plan Note (Addendum)
Smoking cessation was again emphasized He does enjoy his cigarette with a beer and was not willing to commit to a quit attempt

## 2019-03-10 ENCOUNTER — Ambulatory Visit (INDEPENDENT_AMBULATORY_CARE_PROVIDER_SITE_OTHER)
Admission: RE | Admit: 2019-03-10 | Discharge: 2019-03-10 | Disposition: A | Discharge status: Home / Self Care | Payer: Medicare Other | Source: Ambulatory Visit | Attending: Pulmonary Disease | Admitting: Pulmonary Disease

## 2019-03-10 ENCOUNTER — Other Ambulatory Visit: Payer: Self-pay

## 2019-03-10 DIAGNOSIS — J439 Emphysema, unspecified: Secondary | ICD-10-CM | POA: Diagnosis not present

## 2019-03-10 DIAGNOSIS — R911 Solitary pulmonary nodule: Secondary | ICD-10-CM | POA: Diagnosis not present

## 2019-03-14 ENCOUNTER — Telehealth: Payer: Self-pay | Admitting: Pulmonary Disease

## 2019-03-14 NOTE — Telephone Encounter (Signed)
Called and spoke with Patient.  CT results and recommendations from Dr. Elsworth Soho given.  Understanding stated.  Nothing further at this time.

## 2019-03-14 NOTE — Telephone Encounter (Signed)
Pt calling about CT results.  [phone].

## 2019-03-16 ENCOUNTER — Ambulatory Visit (INDEPENDENT_AMBULATORY_CARE_PROVIDER_SITE_OTHER): Payer: Medicare Other

## 2019-03-16 ENCOUNTER — Other Ambulatory Visit: Payer: Self-pay

## 2019-03-16 VITALS — BP 128/62 | Temp 98.1°F | Ht 69.0 in | Wt 214.6 lb

## 2019-03-16 DIAGNOSIS — Z0000 Encounter for general adult medical examination without abnormal findings: Secondary | ICD-10-CM

## 2019-03-16 NOTE — Progress Notes (Signed)
I have reviewed and agree with note, evaluation, plan.   Steroid dose pack may be reasonable- please schedule visit with me- can use same day slot. If no upcoming availability- can also encourage follow up with Dr. Louanne Skye (place referral if needed) or refer to Dr. Charlann Boxer of sports medicine.   Garret Reddish, MD

## 2019-03-16 NOTE — Progress Notes (Signed)
Subjective:   Jonathon [name] is a 78 y.o. male who presents for Medicare Annual/Subsequent preventive examination.  Review of Systems:   Cardiac Risk Factors include: advanced age (>62mn, >>103women);male gender;smoking/ tobacco exposure;dyslipidemia     Objective:    Vitals: BP 128/62 (BP Location: Left Arm, Patient Position: Sitting, Cuff Size: Normal)   Temp 98.1 F (36.7 C) (Temporal)   Ht 5' 9"  (1.753 m)   Wt 214 lb 9.6 oz (97.3 kg)   BMI 31.69 kg/m   Body mass index is 31.69 kg/m.  Advanced Directives 03/16/2019 12/03/2017 10/28/2016 10/28/2016 05/28/2016  Does Patient Have a Medical Advance Directive? Yes Yes (No Data) No No  Type of Advance Directive Living will;Healthcare Power of Attorney - - - -  Does patient want to make changes to medical advance directive? No - Patient declined - - - -  Copy of HBroadwayin Chart? No - copy requested - - - -  Would patient like information on creating a medical advance directive? - - - - No - Patient declined    Tobacco Social History   Tobacco Use  Smoking Status Current Every Day Smoker  . Packs/day: 1.00  . Years: 62.00  . Pack years: 62.00  . Types: Cigarettes  . Start date: 1958  Smokeless Tobacco Never Used  Tobacco Comment   02/10/19- .5 pack      Ready to quit: Not Answered Counseling given: Not Answered Comment: 02/10/19- .5 pack    Clinical Intake:  Pre-visit preparation completed: Yes  Pain : 0-10 Pain Score: 3  Pain Type: Acute pain Pain Location: Back Pain Descriptors / Indicators: Aching Pain Onset: In the past 7 days Pain Frequency: Intermittent  Diabetes: No  How often do you need to have someone help you when you read instructions, pamphlets, or other written materials from your doctor or pharmacy?: 1 - Never  Interpreter Needed?: No  Information entered by :: CDenman GeorgeLPN  Past Medical History:  Diagnosis Date  . Anxiety    never tried on SSRI. xanax 0.563m BID through Dr. BuTollie Pizza  . DDD (degenerative disc disease), lumbar    lumbar DDD- follows with orthopedist Dr. NiLinus Ornrtho. mobic 1573maily and hydrocodone 5/325 prn  . Family history of breast cancer   . Macular degeneration 07/21/2016   Follows with optho- areds2. He states he is unsure of diagnosis.   . Neuropathy    unknown cause ? back related(sees ortho for back). burning sensation in feet. gabapentin 100m67mter dinner, 2 before bed  . Smoker    8-10 cigarettes a day. plus nicorette. 60 years. over 50 pack years  . Venous stasis     in the past- Wound center on legs. Elevate. Compression stockings.    Past Surgical History:  Procedure Laterality Date  . ESOPHAGOGASTRODUODENOSCOPY Left 05/28/2016    Surgeon: JyotJuanita Craver; food stuck in esophagus   Family History  Problem Relation Age of Onset  . Other Mother        "old age" per patient   . Dementia Mother   . Alcohol abuse Father        contributed  . Cancer Sister        unknown type  . Alcohol abuse Brother   . Breast cancer Sister        dx in her early 30s 72sOther Sister        PALB2+  . Breast  cancer Maternal Aunt   . Colon cancer Maternal Uncle   . Breast cancer Other 71   Social History   Socioeconomic History  . Marital status: Married    Spouse name: Not on file  . Number of children: Not on file  . Years of education: Not on file  . Highest education level: Not on file  Occupational History  . Not on file  Social Needs  . Financial resource strain: Not on file  . Food insecurity    Worry: Not on file    Inability: Not on file  . Transportation needs    Medical: Not on file    Non-medical: Not on file  Tobacco Use  . Smoking status: Current Every Day Smoker    Packs/day: 1.00    Years: 62.00    Pack years: 62.00    Types: Cigarettes    Start date: 43  . Smokeless tobacco: Never Used  . Tobacco comment: 02/10/19- .5 pack   Substance and Sexual Activity  . Alcohol use: Yes     Comment: rare drinking occasionally  . Drug use: No  . Sexual activity: Not on file  Lifestyle  . Physical activity    Days per week: Not on file    Minutes per session: Not on file  . Stress: Not on file  Relationships  . Social Herbalist on phone: Not on file    Gets together: Not on file    Attends religious service: Not on file    Active member of club or organization: Not on file    Attends meetings of clubs or organizations: Not on file    Relationship status: Not on file  Other Topics Concern  . Not on file  Social History Narrative   Married around 1964. 3 children. 6 grandkids.       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 ALPRAZolam (XANAX) 0.5 MG tablet Take 1 tablet (0.5 mg total) by mouth 2 (two) times daily as needed for anxiety (1 refill per month).  Marland Kitchen aspirin EC 81 MG tablet Take 81 mg by mouth daily.  Marland Kitchen atorvastatin (LIPITOR) 20 MG tablet Take 1 tablet (20 mg total) by mouth daily.  Marland Kitchen gabapentin (NEURONTIN) 400 MG capsule TAKE 1 CAPSULE MIDDAY AND 2 CAPSULES AT BEDTIME.  Marland Kitchen ketoconazole (NIZORAL) 2 % cream Apply 1 application topically daily. For jock itch up to 2 weeks  . meloxicam (MOBIC) 15 MG tablet TAKE 1 TABLET BY MOUTH EVERY DAY WITH EVENING MEAL  . Multiple Vitamins-Minerals (PRESERVISION AREDS 2 PO) Take 1 tablet by mouth daily.  . sertraline (ZOLOFT) 100 MG tablet Take 1.5 tablets (150 mg total) by mouth daily. (Patient taking differently: Take 50 mg by mouth daily. )   No facility-administered encounter medications on file as of 03/16/2019.     Activities of Daily Living In your present state of health, do you have any difficulty performing the following activities: 03/16/2019  Hearing? N  Vision? N  Difficulty concentrating or making decisions? N  Walking or climbing stairs? N  Dressing or bathing? N   Doing errands, shopping? N  Preparing Food and eating ? N  Using the Toilet? N  In the past six months, have you accidently leaked urine? N  Do you have problems with loss of bowel control? N  Managing your Medications? N  Managing your Finances? N  Housekeeping or managing your Housekeeping? N  Some recent data might be hidden    Patient Care Team: Marin Olp, MD as PCP - General (Family Medicine) Rigoberto Noel, MD as Consulting Physician (Pulmonary Disease) Monna Fam, MD as Consulting Physician (Ophthalmology) Juanita Craver, MD as Consulting Physician (Gastroenterology) [facility] as Consulting Physician   Assessment:   This is a routine wellness examination for Stevens.  Exercise Activities and Dietary recommendations Current Exercise Habits: Home exercise routine, Type of exercise: Other - see comments(active with yardwork activities), Time (Minutes): 30, Frequency (Times/Week): 5, Weekly Exercise (Minutes/Week): 150, Intensity: Moderate  Goals    . patient     Keep doing what you doing now; staying active      . Patient Stated     Maintain your health !     . Quit Smoking     Now smokes 1/2 pack from a pack a day  Smoking;  Educated to avoid secondary smoke   Meds may help; chatix (Varenicline); Zyban (Bupropion SR); Nicotine Replacement (gum; lozenges; patches; etc.)          Fall Risk Fall Risk  03/16/2019 12/27/2018 08/27/2018 12/03/2017 12/03/2017  Falls in the past year? 0 0 - No No  Number falls in past yr: 0 0 0 - -  Injury with Fall? 0 0 0 - -  Follow up Education provided;Falls prevention discussed;Falls evaluation completed - - - -   Is the patient's home free of loose throw rugs in walkways, pet beds, electrical cords, etc?   yes      Grab bars in the bathroom? yes      Handrails on the stairs?   yes      Adequate lighting?   yes  Timed Get Up and Go Performed: completed and within normal timeframe; no gait abnormalities  noted    Depression Screen PHQ 2/9 Scores 03/16/2019 12/27/2018 08/27/2018 12/03/2017  PHQ - 2 Score 0 0 0 0  PHQ- 9 Score - 0 - -    Cognitive Function-no cognition concerns at this time  MMSE - Mini Mental State Exam 12/03/2017  Not completed: (No Data)     6CIT Screen 03/16/2019 10/28/2016  What Year? 0 points 0 points  What month? 0 points 0 points  What time? 0 points 0 points  Count back from 20 0 points 0 points  Months in reverse 0 points 0 points  Repeat phrase 2 points 6 points  Total Score 2 6    Immunization History  Administered Date(s) Administered  . Fluad Quad(high Dose 65+) 02/10/2019  . Influenza, High Dose Seasonal PF 05/18/2015, 03/18/2016, 03/26/2017, 06/04/2018  . Influenza-Unspecified 05/18/2015, 03/18/2016, 04/30/2016  . Pneumococcal Conjugate-13 01/31/2014  . Pneumococcal Polysaccharide-23 12/03/2017  . Zoster 07/06/2013    Qualifies for Shingles Vaccine? Discussed and patient will check with pharmacy for coverage.  Patient education handout provided    Screening Tests Health Maintenance  Topic Date Due  . TETANUS/TDAP  10/27/1959  . INFLUENZA VACCINE  Completed  . PNA vac Low Risk Adult  Completed   Cancer Screenings: Lung: Low Dose CT Chest recommended if Age 71-80 years, 30 pack-year currently smoking OR have quit w/in 15years. Patient does qualify. Colorectal: up to date; last with Dr. Collene Mares       Plan:  I have personally reviewed and addressed the Medicare Annual Wellness questionnaire and have noted the following in the patient's chart:  A. Medical and social history  B. Use of alcohol, tobacco or illicit drugs  C. Current medications and supplements D. Functional ability and status E.  Nutritional status F.  Physical activity G. Advance directives H. List of other physicians I.  Hospitalizations, surgeries, and ER visits in previous 12 months J.  [place] such as hearing and vision if needed, cognitive and depression L.  Referrals, records requested, and appointments- none   In addition, I have reviewed and discussed with patient certain preventive protocols, quality metrics, and best practice recommendations. A written personalized care plan for preventive services as well as general preventive health recommendations were provided to patient.   Signed,  Denman George, LPN  Nurse Health Advisor   Nurse Notes: Patient states that he is having a flair in back pain.  Continues to take Meloxicam but is asking if it would be appropriate for him to have a steroid dose pack prescribed.  He has not seen Dr. Louanne Skye in over 3 years.  Please advise.

## 2019-03-16 NOTE — Patient Instructions (Signed)
[name] , Thank you for taking time to come for your Medicare Wellness Visit. I appreciate your ongoing commitment to your health goals. Please review the following plan we discussed and let me know if I can assist you in the future.   Screening recommendations/referrals: Colorectal Screening: up to date; follow up as directed with Dr. Collene [name]   Vision and Dental Exams: Recommended annual ophthalmology exams for early detection of glaucoma and other disorders of the eye Recommended annual dental exams for proper oral hygiene  Vaccinations: Influenza vaccine: completed 02/10/19 Pneumococcal vaccine: up to date; last 12/03/17 Tdap vaccine: reccommended; Please call your insurance company to determine your out of pocket expense. You may also receive this vaccine at your local pharmacy or Health Dept. Shingles vaccine: Please call your insurance company to determine your out of pocket expense for the Shingrix vaccine. You may receive this vaccine at your local pharmacy.  Advanced directives: Please bring a copy of your POA (Power of Attorney) and/or Living Will to your next appointment.  Goals: Recommend to drink at least 6-8 8oz glasses of water per day.  Next appointment: Please schedule your Annual Wellness Visit with your Nurse Health Advisor in one year.  Preventive Care 78 Years and Older, Male Preventive care refers to lifestyle choices and visits with your health care provider that can promote health and wellness. What does preventive care include?  A yearly physical exam. This is also called an annual well check.  Dental exams once or twice a year.  Routine eye exams. Ask your health care provider how often you should have your eyes checked.  Personal lifestyle choices, including:  Daily care of your teeth and gums.  Regular physical activity.  Eating a healthy diet.  Avoiding tobacco and drug use.  Limiting alcohol use.  Practicing safe sex.  Taking low doses of  aspirin every day if recommended by your health care provider..  Taking vitamin and mineral supplements as recommended by your health care provider. What happens during an annual well check? The services and screenings done by your health care provider during your annual well check will depend on your age, overall health, lifestyle risk factors, and family history of disease. Counseling  Your health care provider may ask you questions about your:  Alcohol use.  Tobacco use.  Drug use.  Emotional well-being.  Home and relationship well-being.  Sexual activity.  Eating habits.  History of falls.  Memory and ability to understand (cognition).  Work and work Statistician. Screening  You may have the following tests or measurements:  Height, weight, and BMI.  Blood pressure.  Lipid and cholesterol levels. These may be checked every 5 years, or more frequently if you are over 68 years old.  Skin check.  Lung cancer screening. You may have this screening every year starting at age 78 if you have a 30-pack-year history of smoking and currently smoke or have quit within the past 15 years.  Fecal occult blood test (FOBT) of the stool. You may have this test every year starting at age 78.  Flexible sigmoidoscopy or colonoscopy. You may have a sigmoidoscopy every 5 years or a colonoscopy every 10 years starting at age 78.  Prostate cancer screening. Recommendations will vary depending on your family history and other risks.  Hepatitis C blood test.  Hepatitis B blood test.  Sexually transmitted disease (STD) testing.  Diabetes screening. This is done by checking your blood sugar (glucose) after you have not eaten for a  while (fasting). You may have this done every 1-3 years.  Abdominal aortic aneurysm (AAA) screening. You may need this if you are a current or former smoker.  Osteoporosis. You may be screened starting at age 78 if you are at high risk. Talk with your health  care provider about your test results, treatment options, and if necessary, the need for more tests. Vaccines  Your health care provider may recommend certain vaccines, such as:  Influenza vaccine. This is recommended every year.  Tetanus, diphtheria, and acellular pertussis (Tdap, Td) vaccine. You may need a Td booster every 10 years.  Zoster vaccine. You may need this after age 28.  Pneumococcal 13-valent conjugate (PCV13) vaccine. One dose is recommended after age 78.  Pneumococcal polysaccharide (PPSV23) vaccine. One dose is recommended after age 78. Talk to your health care provider about which screenings and vaccines you need and how often you need them. This information is not intended to replace advice given to you by your health care provider. Make sure you discuss any questions you have with your health care provider. Document Released: 06/29/2015 Document Revised: 02/20/2016 Document Reviewed: 04/03/2015 Elsevier Interactive Patient Education  2017 [employer].  Fall Prevention in the Home Falls can cause injuries. They can happen to people of all ages. There are many things you can do to make your home safe and to help prevent falls. What can I do on the outside of my home?  Regularly fix the edges of walkways and driveways and fix any cracks.  Remove anything that might make you trip as you walk through a door, such as a raised step or threshold.  Trim any bushes or trees on the path to your home.  Use bright outdoor lighting.  Clear any walking paths of anything that might make someone trip, such as rocks or tools.  Regularly check to see if handrails are loose or broken. Make sure that both sides of any steps have handrails.  Any raised decks and porches should have guardrails on the edges.  Have any leaves, snow, or ice cleared regularly.  Use sand or salt on walking paths during winter.  Clean up any spills in your garage right away. This includes oil or  grease spills. What can I do in the bathroom?  Use night lights.  Install grab bars by the toilet and in the tub and shower. Do not use towel bars as grab bars.  Use non-skid mats or decals in the tub or shower.  If you need to sit down in the shower, use a plastic, non-slip stool.  Keep the floor dry. Clean up any water that spills on the floor as soon as it happens.  Remove soap buildup in the tub or shower regularly.  Attach bath mats securely with double-sided non-slip rug tape.  Do not have throw rugs and other things on the floor that can make you trip. What can I do in the bedroom?  Use night lights.  Make sure that you have a light by your bed that is easy to reach.  Do not use any sheets or blankets that are too big for your bed. They should not hang down onto the floor.  Have a firm chair that has side arms. You can use this for support while you get dressed.  Do not have throw rugs and other things on the floor that can make you trip. What can I do in the kitchen?  Clean up any spills right away.  Avoid walking on wet floors.  Keep items that you use a lot in easy-to-reach places.  If you need to reach something above you, use a strong step stool that has a grab bar.  Keep electrical cords out of the way.  Do not use floor polish or wax that makes floors slippery. If you must use wax, use non-skid floor wax.  Do not have throw rugs and other things on the floor that can make you trip. What can I do with my stairs?  Do not leave any items on the stairs.  Make sure that there are handrails on both sides of the stairs and use them. Fix handrails that are broken or loose. Make sure that handrails are as long as the stairways.  Check any carpeting to make sure that it is firmly attached to the stairs. Fix any carpet that is loose or worn.  Avoid having throw rugs at the top or bottom of the stairs. If you do have throw rugs, attach them to the floor with  carpet tape.  Make sure that you have a light switch at the top of the stairs and the bottom of the stairs. If you do not have them, ask someone to add them for you. What else can I do to help prevent falls?  Wear shoes that:  Do not have high heels.  Have rubber bottoms.  Are comfortable and fit you well.  Are closed at the toe. Do not wear sandals.  If you use a stepladder:  Make sure that it is fully opened. Do not climb a closed stepladder.  Make sure that both sides of the stepladder are locked into place.  Ask someone to hold it for you, if possible.  Clearly mark and make sure that you can see:  Any grab bars or handrails.  First and last steps.  Where the edge of each step is.  Use tools that help you move around (mobility aids) if they are needed. These include:  Canes.  Walkers.  Scooters.  Crutches.  Turn on the lights when you go into a dark area. Replace any light bulbs as soon as they burn out.  Set up your furniture so you have a clear path. Avoid moving your furniture around.  If any of your floors are uneven, fix them.  If there are any pets around you, be aware of where they are.  Review your medicines with your doctor. Some medicines can make you feel dizzy. This can increase your chance of falling. Ask your doctor what other things that you can do to help prevent falls. This information is not intended to replace advice given to you by your health care provider. Make sure you discuss any questions you have with your health care provider. Document Released: 03/29/2009 Document Revised: 11/08/2015 Document Reviewed: 07/07/2014 Elsevier Interactive Patient Education  2017 [employer].

## 2019-03-17 NOTE — Progress Notes (Signed)
Called and spoke with pt and pt will come on 03/21/2019 at 2:20pm to see [facility].

## 2019-03-21 ENCOUNTER — Other Ambulatory Visit: Payer: Self-pay

## 2019-03-21 ENCOUNTER — Encounter: Payer: Self-pay | Admitting: Family Medicine

## 2019-03-21 ENCOUNTER — Ambulatory Visit (INDEPENDENT_AMBULATORY_CARE_PROVIDER_SITE_OTHER): Payer: Medicare Other | Admitting: Family Medicine

## 2019-03-21 VITALS — BP 132/80 | HR 66 | Temp 98.2°F | Wt 214.4 lb

## 2019-03-21 DIAGNOSIS — Z23 Encounter for immunization: Secondary | ICD-10-CM

## 2019-03-21 DIAGNOSIS — M5136 Other intervertebral disc degeneration, lumbar region: Secondary | ICD-10-CM

## 2019-03-21 MED ORDER — PREDNISONE 20 MG PO TABS: ORAL_TABLET | ORAL | 0 refills | Status: DC

## 2019-03-21 NOTE — Progress Notes (Signed)
Phone [phone]   Subjective:  Jonathon [name] is a 78 y.o. year old very pleasant male patient who presents for/with See problem oriented charting Chief Complaint  Patient presents with   Follow-up   discuss prednisone   ROS-no fever or chills.  No saddle anesthesia.  No leg weakness.  No fecal or urinary incontinence.  No falls.  Denies injury.  Smoking 1 pack per 3 days-encouraged complete cessation  Past Medical History-  Patient Active Problem List   Diagnosis Date Noted   GAD (generalized anxiety disorder)     Priority: High   Cigarette smoker     Priority: High   OSA on CPAP 12/03/2017    Priority: Medium   Hyperlipidemia 01/28/2017    Priority: Medium   Pulmonary nodules 12/26/2016    Priority: Medium   Aortic atherosclerosis (HCC) 10/28/2016    Priority: Medium   Neuropathy     Priority: Medium   DDD (degenerative disc disease), lumbar     Priority: Medium   Genetic testing 10/14/2016    Priority: Low   Family history of colon cancer 10/02/2016    Priority: Low   Family history of breast cancer     Priority: Low   Hilar lymphadenopathy 09/23/2016    Priority: Low   Lymph nodes enlarged 09/23/2016    Priority: Low   Family history of genetic disease 08/28/2016    Priority: Low   Macular degeneration 07/21/2016    Priority: Low   Jock itch 07/31/2017    Medications- reviewed and updated Current Outpatient Medications  Medication Sig Dispense Refill   ALPRAZolam (XANAX) 0.5 MG tablet Take 1 tablet (0.5 mg total) by mouth 2 (two) times daily as needed for anxiety (1 refill per month). 35 tablet 4   aspirin EC 81 MG tablet Take 81 mg by mouth daily.     atorvastatin (LIPITOR) 20 MG tablet Take 1 tablet (20 mg total) by mouth daily. 90 tablet 3   gabapentin (NEURONTIN) 400 MG capsule TAKE 1 CAPSULE MIDDAY AND 2 CAPSULES AT BEDTIME. 270 capsule 5   ketoconazole (NIZORAL) 2 % cream Apply 1 application topically daily. For jock itch  up to 2 weeks 60 g 2   meloxicam (MOBIC) 15 MG tablet TAKE 1 TABLET BY MOUTH EVERY DAY WITH EVENING MEAL 90 tablet 3   Multiple Vitamins-Minerals (PRESERVISION AREDS 2 PO) Take 1 tablet by mouth daily.     sertraline (ZOLOFT) 100 MG tablet Take 1.5 tablets (150 mg total) by mouth daily. (Patient taking differently: Take 50 mg by mouth daily. ) 135 tablet 3   predniSONE (DELTASONE) 20 MG tablet Take 2 pills for 3 days, 1 pill for 4 days 10 tablet 0   No current facility-administered medications for this visit.      Objective:  BP 132/80    Pulse 66    Temp 98.2 F (36.8 C)    Wt 214 lb 6.4 oz (97.3 kg)    SpO2 95%    BMI 31.66 kg/m  Gen: NAD, resting comfortably CV: RRR no murmurs rubs or gallops Lungs: CTAB no crackles, wheeze, rhonchi Ext: no edema Skin: warm, dry Back - Normal skin, Spine with normal alignment and no deformity.  No tenderness to vertebral process palpation.  Paraspinous muscles are  Tender on the right butwithout spasm.   Range of motion is full at neck and lumbar sacral regions- moves very slowly due to discomfort..  Neuro- no saddle anesthesia, 5/5 strength lower extremities, 2+ reflexes  Assessment and Plan  #Degenerative disc disease-right low back pain  S:Pt states he is wanting something for lower back pain that has been going on for several years due to arthritis. He used to have an otho doc but he has stayed away for several years because he hasn't need him. He does a lot of lab work and he thinks that aggravates his back.  He has seen Dr. Louanne Skye in the past. Has had issues with back pain for years- told has degenerative disc disease. Last x-ray of lumbar spine 09/08/2007- spondylosis and possible mild compression of T9.   Current flare up of pain started about 3 weeks ago. It would seem to get better then he would go out and work at his home and end up tweaking it again. Pain is controlled by sitting still. At its worst up to 10/10. No pain down into the  legs. He is taking meloxicam (have to watch kidney function) .   He worries that physical therapy would worsen pain as it has in the past.   Heating pad helps some. Meloxicam helps some A/P: Patient with history of degenerative disc disease-with what seems to be a current flare of his pain.  He is on regular NSAIDs at baseline per Dr. Louanne Skye with meloxicam 15 mg.  Already trying heat.  Flareup of pain over the last 3 weeks that does not seem to be improving- after discussion today patient would like to trial a course of prednisone.  We discussed without radiculopathy there may not be significant relief in pain but he would like to move forward and try this anyway before seeing sports medicine.  My first recommendation was for physical therapy which he strongly declined -Strongly encouraged close follow-up if fails to improve -We have planned regular follow-up next month-certainly would hope he had made significant progress by then or contacted [place] if not  Recommended follow up: Already scheduled Future Appointments  Date Time Provider [place]  04/29/2019  8:00 AM Marin Olp, MD [facility]   Lab/Order associations:   ICD-10-CM   1. DDD (degenerative disc disease), lumbar  M51.36   2. Need for tetanus, diphtheria, and acellular pertussis (Tdap) vaccine  Z23 Tdap vaccine greater than or equal to 7yo IM  -Patient has Medicare and aware there may be cost for Tdap-he wanted to proceed anyway.  We discussed would likely be cheaper at pharmacy  Meds ordered this encounter  Medications   predniSONE (DELTASONE) 20 MG tablet    Sig: Take 2 pills for 3 days, 1 pill for 4 days    Dispense:  10 tablet    Refill:  0    Return precautions advised.  Garret Reddish, MD

## 2019-03-21 NOTE — Patient Instructions (Addendum)
Health Maintenance Due  Topic Date Due  . TETANUS/TDAP -today 10/27/1959   Flare up of chronic back pain- we will trial a course of prednisone for 7 days. The hope is that pain improves at least 50% within 2-3 weeks. If fails to improve or worsens- let us know and we can help get you in with Dr. Louanne Skye or refer you to our sports medicine physician Dr. Tamala Julian

## 2019-03-22 NOTE — Progress Notes (Signed)
Noted, recharted.

## 2019-03-22 NOTE — Addendum Note (Signed)
Addended by: Clyde Lundborg A on: 03/22/2019 08:47 AM   Modules accepted: Orders

## 2019-03-28 ENCOUNTER — Telehealth: Payer: Self-pay | Admitting: Family Medicine

## 2019-03-28 ENCOUNTER — Telehealth: Payer: Self-pay | Admitting: Physical Medicine and Rehabilitation

## 2019-03-28 ENCOUNTER — Other Ambulatory Visit: Payer: Self-pay

## 2019-03-28 DIAGNOSIS — M5136 Other intervertebral disc degeneration, lumbar region: Secondary | ICD-10-CM

## 2019-03-28 NOTE — Telephone Encounter (Signed)
See note

## 2019-03-28 NOTE — Telephone Encounter (Signed)
I spoke with patient and offered him our next available appointment which is 3 weeks from now on 11/3. He states that he is going to have to find another doctor because that is a month away and he "can't go on like this."  Patient was previously scheduled for an OV with Dr. Ernestina Patches on 10/07/2017. He was a no show for this appointment.

## 2019-03-28 NOTE — Telephone Encounter (Signed)
Patient was seen 03/21/2019 and states symptoms have worsen and patient would like referral to sports medicine, patient would like a follow up regarding where and with who referral will be placed with, please advise

## 2019-03-28 NOTE — Telephone Encounter (Signed)
Referral placed to Sports Medicine, called pt and made pt aware.

## 2019-04-01 ENCOUNTER — Encounter: Payer: Self-pay | Admitting: Family Medicine

## 2019-04-01 ENCOUNTER — Ambulatory Visit (INDEPENDENT_AMBULATORY_CARE_PROVIDER_SITE_OTHER): Payer: Medicare Other

## 2019-04-01 ENCOUNTER — Other Ambulatory Visit: Payer: Self-pay

## 2019-04-01 ENCOUNTER — Ambulatory Visit (INDEPENDENT_AMBULATORY_CARE_PROVIDER_SITE_OTHER): Payer: Medicare Other | Admitting: Family Medicine

## 2019-04-01 ENCOUNTER — Telehealth: Payer: Self-pay | Admitting: Family Medicine

## 2019-04-01 VITALS — BP 136/74 | HR 68 | Temp 98.7°F | Resp 16 | Ht 71.0 in | Wt 218.2 lb

## 2019-04-01 DIAGNOSIS — M545 Low back pain, unspecified: Secondary | ICD-10-CM

## 2019-04-01 DIAGNOSIS — M5136 Other intervertebral disc degeneration, lumbar region: Secondary | ICD-10-CM | POA: Diagnosis not present

## 2019-04-01 MED ORDER — HYDROCODONE-ACETAMINOPHEN 5-325 MG PO TABS: 1.0000 | ORAL_TABLET | Freq: Two times a day (BID) | ORAL | 0 refills | Status: DC | PRN

## 2019-04-01 MED ORDER — CYCLOBENZAPRINE HCL 10 MG PO TABS: 10.0000 mg | ORAL_TABLET | Freq: Every evening | ORAL | 0 refills | Status: DC | PRN

## 2019-04-01 NOTE — Patient Instructions (Signed)
I will let you know about your xray results early next week.   In the meantime, please take the hydrocodone with tylenol up to twice a day for your back pain. You may continue with meloxicam daily as well.   You may try the muscle relaxer at night as well. It may make you sleep so be cautious if having to get up in the middle of the night: you don't want to fall.   Let me know next week if you are feeling better.   Thanks, Dr. Jonni Sanger

## 2019-04-01 NOTE — Telephone Encounter (Signed)
Copied from [place] [id number]. Topic: General - Other >> Apr 01, 2019 12:02 PM Wynetta Emery, [place] C wrote: Reason for CRM: pt called in to update provider, Pt says that he was told that someone would call him by the end of the week to schedule an apt for his back pain with the back Dr. Abbott Pao says that he has not received a call.   Pt would like further assistance.  Called pt and let him know that Dr. Yong Channel did place the referral for him to see Dr. Tamala Julian at the [facility] office, and the [facility] office should be calling to get the patient scheduled for an appt. I gave the patient the [facility] office's phone number in case he wanted to call them if he doesn't hear from them.

## 2019-04-01 NOTE — Progress Notes (Signed)
Subjective  CC:  Chief Complaint  Patient presents with   Back Pain    Reports that he has had pain for over a month. Reports pain is in the lower back and he has tried Tylenol   Same day acute visit; PCP not available. New pt to me. Chart reviewed.   HPI: Jonathon [name] is a 78 y.o. male who presents to the office today to address the problems listed above in the chief complaint.  78 yo active male with 4 week h/o midline low back pain: acute in onset. I reviewed OV from 10/5. Treated with pred: ? DJD flare or other. Since, he called back and stated was not improved: a SM referral was placed. He walked in today; he is frustarted since he feels badly and can't get in to see a specialist.   Longstanding lumbar DJD treated by ortho in past. On nsaids.   No radicular sxs, pain keeps him awake at night. Heating pad helps. Some mm spasm. No b/b dysfunction. Pain is constant although at times he can sit with heating pad or work himself into a comfortable position in the bed. No groin pain. No leg pain. No improvement with prednisone  H/o compression fracture.   No recent trauma.    Assessment  1. Acute midline low back pain without sciatica   2. DDD (degenerative disc disease), lumbar      Plan   Back pain, acute onset and now persistent:  DJD vs compression fracture vs other. Pain is very localized. rec xrays today to check for compression fracture. Treat pain with mm relaxer (caution advised) and norco; pt has tolerated well in the past. Will see SM when available.   Follow up: No follow-ups on file.  04/29/2019  Orders Placed This Encounter  Procedures   DG Lumbar Spine Complete   Meds ordered this encounter  Medications   cyclobenzaprine (FLEXERIL) 10 MG tablet    Sig: Take 1 tablet (10 mg total) by mouth at bedtime as needed for muscle spasms.    Dispense:  30 tablet    Refill:  0   HYDROcodone-acetaminophen (NORCO) 5-325 MG tablet    Sig: Take 1 tablet by mouth 2  (two) times daily as needed for moderate pain.    Dispense:  20 tablet    Refill:  0      I reviewed the patients updated PMH, FH, and SocHx.    Patient Active Problem List   Diagnosis Date Noted   OSA on CPAP 12/03/2017   Jock itch 07/31/2017   Hyperlipidemia 01/28/2017   Pulmonary nodules 12/26/2016   Aortic atherosclerosis ([place]) 10/28/2016   Genetic testing 10/14/2016   Family history of colon cancer 10/02/2016   Family history of breast cancer    Hilar lymphadenopathy 09/23/2016   Lymph nodes enlarged 09/23/2016   Family history of genetic disease 08/28/2016   Macular degeneration 07/21/2016   Neuropathy    DDD (degenerative disc disease), lumbar    GAD (generalized anxiety disorder)    Cigarette smoker    Current Meds  Medication Sig   ALPRAZolam (XANAX) 0.5 MG tablet Take 1 tablet (0.5 mg total) by mouth 2 (two) times daily as needed for anxiety (1 refill per month).   aspirin EC 81 MG tablet Take 81 mg by mouth daily.   atorvastatin (LIPITOR) 20 MG tablet Take 1 tablet (20 mg total) by mouth daily.   gabapentin (NEURONTIN) 400 MG capsule TAKE 1 CAPSULE MIDDAY AND 2 CAPSULES  AT BEDTIME.   ketoconazole (NIZORAL) 2 % cream Apply 1 application topically daily. For jock itch up to 2 weeks   meloxicam (MOBIC) 15 MG tablet TAKE 1 TABLET BY MOUTH EVERY DAY WITH EVENING MEAL   Multiple Vitamins-Minerals (PRESERVISION AREDS 2 PO) Take 1 tablet by mouth daily.   predniSONE (DELTASONE) 20 MG tablet Take 2 pills for 3 days, 1 pill for 4 days   sertraline (ZOLOFT) 100 MG tablet Take 1.5 tablets (150 mg total) by mouth daily. (Patient taking differently: Take 50 mg by mouth daily. )    Allergies: Patient is allergic to penicillins. Family History: Patient family history includes Alcohol abuse in his brother and father; Breast cancer in his maternal aunt and sister; Breast cancer (age of onset: 25) in an other family member; Cancer in his sister; Colon  cancer in his maternal uncle; Dementia in his mother; Other in his mother and sister. Social History:  Patient  reports that he has been smoking cigarettes. He started smoking about 62 years ago. He has a 62.00 pack-year smoking history. He has never used smokeless tobacco. He reports current alcohol use. He reports that he does not use drugs.  Review of Systems: Constitutional: Negative for fever malaise or anorexia Cardiovascular: negative for chest pain Respiratory: negative for SOB or persistent cough Gastrointestinal: negative for abdominal pain  Objective  Vitals: BP 136/74    Pulse 68    Temp 98.7 F (37.1 C) (Tympanic)    Resp 16    Ht 5\' 11"  (1.803 m)    Wt 218 lb 3.2 oz (99 kg)    SpO2 96%    BMI 30.43 kg/m  General: no acute distress , A&Ox3, angry Back: right lower lumbar ttp, no si or sciatic notch ttp. No hip bursa ttp. Fairly normal gait but moving slowly. Neg SLR bilaterally7    Commons side effects, risks, benefits, and alternatives for medications and treatment plan prescribed today were discussed, and the patient expressed understanding of the given instructions. Patient is instructed to call or message via MyChart if he/she has any questions or concerns regarding our treatment plan. No barriers to understanding were identified. We discussed Red Flag symptoms and signs in detail. Patient expressed understanding regarding what to do in case of urgent or emergency type symptoms.   Medication list was reconciled, printed and provided to the patient in AVS. Patient instructions and summary information was reviewed with the patient as documented in the AVS. This note was prepared with assistance of Dragon voice recognition software. Occasional wrong-word or sound-a-like substitutions may have occurred due to the inherent limitations of voice recognition software

## 2019-04-04 NOTE — Progress Notes (Signed)
Please call patient: I have reviewed his/her lab results. Please check to see how he is doing. Any better with medications? Xray shows DJD changes but fortunately, no new compression fracture

## 2019-04-06 ENCOUNTER — Other Ambulatory Visit: Payer: Self-pay

## 2019-04-06 ENCOUNTER — Encounter: Payer: Self-pay | Admitting: Family Medicine

## 2019-04-06 ENCOUNTER — Ambulatory Visit (INDEPENDENT_AMBULATORY_CARE_PROVIDER_SITE_OTHER): Payer: Medicare Other | Admitting: Family Medicine

## 2019-04-06 VITALS — BP 110/78 | HR 74 | Ht 71.0 in | Wt 217.0 lb

## 2019-04-06 DIAGNOSIS — M48061 Spinal stenosis, lumbar region without neurogenic claudication: Secondary | ICD-10-CM | POA: Diagnosis not present

## 2019-04-06 DIAGNOSIS — M5416 Radiculopathy, lumbar region: Secondary | ICD-10-CM

## 2019-04-06 NOTE — Patient Instructions (Addendum)
Good to see you.  Ice 20 minutes 2 times daily. Usually after activity and before bed. Exercises 3 times a week.  Tart cherry extract 1200mg  at night Vitamin D 2000 IU daily  Will order epidural once we get MRI results

## 2019-04-06 NOTE — Assessment & Plan Note (Signed)
Degenerative spinal stenosis.  Discussed with patient in great length.  We discussed icing regimen and home exercises.  I do believe that patient does have some radicular symptoms.  He is already on high doses of gabapentin.  We discussed over-the-counter medications.  Patient has responded well to epidurals previously.  Patient may need this again but I do feel advanced imaging would be warranted.  Patient was unable to tolerate the physical exam at this point and since then the only reason he can do daily activities is secondary to the pain medication which I do not feel is safe for him to continue constantly secondary to him being a large fall risk as well as during the interview discussed how much beer he drinks on a daily basis.  Patient understands at this will not be refilled by this office.  We will though hopefully get advanced imaging including an MRI for further evaluation for the spinal stenosis and so then we can order epidural appropriately.  Likely will not need any surgical intervention.

## 2019-04-06 NOTE — Progress Notes (Signed)
[name] [place],  [address] Phone: [phone] Subjective:   Jonathon [name], am serving as a scribe for Dr. Hulan Saas.   CC: Low back pain  [id number]  Jonathon [name] is a 78 y.o. male coming in with complaint of back pain for 6 weeks. Pain occurs day after yard work. Constant pain on right side. Denies any radiating symptoms. Has been using hydrocodone for pain especially when sleeping. Is having relief with this medication. Uses heat at night for his pain.  Patient states that if it was not for his hydrocodone he does not think he would be able to do anything.  Feels 100% better while he is taking the hydrocodone.  Patient states that the pain has been localized, [name] radiation of the pain though.  Xray 04/01/2019 IMPRESSION: 1. Multilevel degenerative change throughout the lumbar spine. Diffuse degenerative disc disease and facet hypertrophy. 2. [name] acute or compression fracture    Past Medical History:  Diagnosis Date   Anxiety    never tried on SSRI. xanax 0.66m BID through Dr. BTollie Pizza    DDD (degenerative disc disease), lumbar    lumbar DDD- follows with orthopedist Dr. NLinus Ornortho. mobic 133mdaily and hydrocodone 5/325 prn   Family history of breast cancer    Macular degeneration 07/21/2016   Follows with optho- areds2. He states he is unsure of diagnosis.    Neuropathy    unknown cause ? back related(sees ortho for back). burning sensation in feet. gabapentin 10060mfter dinner, 2 before bed   Smoker    8-10 cigarettes a day. plus nicorette. 60 years. over 50 pack years   Venous stasis     in the past- Wound center on legs. Elevate. Compression stockings.    Past Surgical History:  Procedure Laterality Date   ESOPHAGOGASTRODUODENOSCOPY Left 05/28/2016    Surgeon: JyoJuanita CraverD; food stuck in esophagus   Social History   Socioeconomic History   Marital status: Married    Spouse name:  Not on file   Number of children: Not on file   Years of education: Not on file   Highest education level: Not on file  Occupational History   Not on file  Social Needs   Financial resource strain: Not on file   Food insecurity    Worry: Not on file    Inability: Not on file   Transportation needs    Medical: Not on file    Non-medical: Not on file  Tobacco Use   Smoking status: Current Every Day Smoker    Packs/day: 1.00    Years: 62.00    Pack years: 62.00    Types: Cigarettes    Start date: 1958   Smokeless tobacco: Never Used   Tobacco comment: 02/10/19- .5 pack   Substance and Sexual Activity   Alcohol use: Yes    Comment: rare drinking occasionally   Drug use: [name]   Sexual activity: Not on file  Lifestyle   Physical activity    Days per week: Not on file    Minutes per session: Not on file   Stress: Not on file  Relationships   Social connections    Talks on phone: Not on file    Gets together: Not on file    Attends religious service: Not on file    Active member of club or organization: Not on file    Attends meetings of clubs or organizations: Not on  file    Relationship status: Not on file  Other Topics Concern   Not on file  Social History Narrative   Married around 1964. 3 children. 6 grandkids.       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    Family History  Problem Relation Age of  Onset   Other Mother        "old age" per patient    Dementia Mother    Alcohol abuse Father        contributed   Cancer Sister        unknown type   Alcohol abuse Brother    Breast cancer Sister        dx in her early 42s   Other Sister        PALB2+   Breast cancer Maternal Aunt    Colon cancer Maternal Uncle    Breast cancer Other 39    Current Outpatient Medications (Endocrine & Metabolic):    predniSONE (DELTASONE) 20 MG tablet, Take 2 pills for 3 days, 1 pill for 4 days  Current Outpatient Medications (Cardiovascular):    atorvastatin (LIPITOR) 20 MG tablet, Take 1 tablet (20 mg total) by mouth daily.   Current Outpatient Medications (Analgesics):    aspirin EC 81 MG tablet, Take 81 mg by mouth daily.   HYDROcodone-acetaminophen (NORCO) 5-325 MG tablet, Take 1 tablet by mouth 2 (two) times daily as needed for moderate pain.   meloxicam (MOBIC) 15 MG tablet, TAKE 1 TABLET BY MOUTH EVERY DAY WITH EVENING MEAL   Current Outpatient Medications (Other):    ALPRAZolam (XANAX) 0.5 MG tablet, Take 1 tablet (0.5 mg total) by mouth 2 (two) times daily as needed for anxiety (1 refill per month).   cyclobenzaprine (FLEXERIL) 10 MG tablet, Take 1 tablet (10 mg total) by mouth at bedtime as needed for muscle spasms.   gabapentin (NEURONTIN) 400 MG capsule, TAKE 1 CAPSULE MIDDAY AND 2 CAPSULES AT BEDTIME.   ketoconazole (NIZORAL) 2 % cream, Apply 1 application topically daily. For jock itch up to 2 weeks   Multiple Vitamins-Minerals (PRESERVISION AREDS 2 PO), Take 1 tablet by mouth daily.   sertraline (ZOLOFT) 100 MG tablet, Take 1.5 tablets (150 mg total) by mouth daily. (Patient taking differently: Take 50 mg by mouth daily. )    Past medical history, social, surgical and family history all reviewed in electronic medical record.  [name] pertanent information unless stated regarding to the chief complaint.   Review of Systems:  [name] headache, visual changes, nausea,  vomiting, diarrhea, constipation, dizziness, abdominal pain, skin rash, fevers, chills, night sweats, weight loss, swollen lymph nodes, body aches, joint swelling,  chest pain, shortness of breath, mood changes.  Positive muscle aches  Objective  Blood pressure 110/78, pulse 74, height 5' 11"  (1.803 m), weight 217 lb (98.4 kg), SpO2 94 %.    General: [name] apparent distress alert and oriented x3 mood and affect normal, dressed appropriately.  HEENT: Pupils equal,  extraocular movements intact  Respiratory: Patient's speak in full sentences and does not appear short of breath  Cardiovascular: [name] lower extremity edema, non tender, [name] erythema  Skin: Warm dry intact with [name] signs of infection or rash on extremities or on axial skeleton.  Abdomen: Soft nontender  Neuro: Cranial nerves II through XII are intact,  Lymph: [name] lymphadenopathy of posterior or anterior cervical chain or axillae bilaterally.  Gait severely antalgic favoring right side of the back MSK:  tender with limited range of motion and stability and symmetric strength and tone of shoulders, elbows, wrist, hip, knee and ankles bilaterally.   Arthritic changes of multiple joints  Patient back exam does have some degenerative scoliosis.  Did not do significant palpation.  Decreased range of motion in all planes.  Negative straight leg test.  Significant tightness of the [facility] test right greater than right.  5 out of 5 strength in lower extremities.  Deep tendon reflexes appear to be intact but does have some peripheral neuropathy   Impression and Recommendations:     This case required medical decision making of moderate complexity. The above documentation has been reviewed and is accurate and complete Jonathon Pulley, DO       Note: This dictation was prepared with Dragon dictation along with smaller phrase technology. Any transcriptional errors that result from this process are unintentional.

## 2019-04-14 ENCOUNTER — Ambulatory Visit: Payer: Medicare Other | Admitting: Family Medicine

## 2019-04-18 ENCOUNTER — Ambulatory Visit
Admission: RE | Admit: 2019-04-18 | Discharge: 2019-04-18 | Disposition: A | Discharge status: Home / Self Care | Payer: Medicare Other | Source: Ambulatory Visit | Attending: Family Medicine | Admitting: Family Medicine

## 2019-04-18 ENCOUNTER — Other Ambulatory Visit: Payer: Self-pay

## 2019-04-18 DIAGNOSIS — M48061 Spinal stenosis, lumbar region without neurogenic claudication: Secondary | ICD-10-CM | POA: Diagnosis not present

## 2019-04-18 DIAGNOSIS — M5416 Radiculopathy, lumbar region: Secondary | ICD-10-CM

## 2019-04-19 ENCOUNTER — Other Ambulatory Visit: Payer: Self-pay

## 2019-04-19 DIAGNOSIS — G8929 Other chronic pain: Secondary | ICD-10-CM

## 2019-04-19 DIAGNOSIS — M545 Low back pain, unspecified: Secondary | ICD-10-CM

## 2019-04-25 ENCOUNTER — Ambulatory Visit
Admission: RE | Admit: 2019-04-25 | Discharge: 2019-04-25 | Disposition: A | Discharge status: Home / Self Care | Payer: Medicare Other | Source: Ambulatory Visit | Attending: Family Medicine | Admitting: Family Medicine

## 2019-04-25 ENCOUNTER — Other Ambulatory Visit: Payer: Self-pay

## 2019-04-25 DIAGNOSIS — G8929 Other chronic pain: Secondary | ICD-10-CM

## 2019-04-25 DIAGNOSIS — M48061 Spinal stenosis, lumbar region without neurogenic claudication: Secondary | ICD-10-CM | POA: Diagnosis not present

## 2019-04-25 DIAGNOSIS — M545 Low back pain, unspecified: Secondary | ICD-10-CM

## 2019-04-25 MED ORDER — METHYLPREDNISOLONE ACETATE 40 MG/ML INJ SUSP (RADIOLOG
120.0000 mg | Freq: Once | INTRAMUSCULAR | Status: AC
  Administered 2019-04-25: 10:00:00 120 mg via EPIDURAL

## 2019-04-25 MED ORDER — IOPAMIDOL (ISOVUE-M 200) INJECTION 41%
1.0000 mL | Freq: Once | INTRAMUSCULAR | Status: AC
  Administered 2019-04-25: 1 mL via EPIDURAL

## 2019-04-25 NOTE — Discharge Instructions (Signed)

## 2019-04-29 ENCOUNTER — Ambulatory Visit (INDEPENDENT_AMBULATORY_CARE_PROVIDER_SITE_OTHER): Payer: Medicare Other | Admitting: Family Medicine

## 2019-04-29 ENCOUNTER — Other Ambulatory Visit: Payer: Self-pay

## 2019-04-29 ENCOUNTER — Encounter: Payer: Self-pay | Admitting: Family Medicine

## 2019-04-29 VITALS — BP 132/70 | HR 69 | Temp 97.9°F | Ht 71.0 in | Wt 215.6 lb

## 2019-04-29 DIAGNOSIS — G629 Polyneuropathy, unspecified: Secondary | ICD-10-CM | POA: Diagnosis not present

## 2019-04-29 DIAGNOSIS — E785 Hyperlipidemia, unspecified: Secondary | ICD-10-CM | POA: Diagnosis not present

## 2019-04-29 DIAGNOSIS — I700 Atherosclerosis of aorta: Secondary | ICD-10-CM

## 2019-04-29 DIAGNOSIS — F411 Generalized anxiety disorder: Secondary | ICD-10-CM

## 2019-04-29 DIAGNOSIS — F17210 Nicotine dependence, cigarettes, uncomplicated: Secondary | ICD-10-CM

## 2019-04-29 LAB — CBC WITH DIFFERENTIAL/PLATELET
Basophils Absolute: 0 10*3/uL (ref 0.0–0.1)
Basophils Relative: 0.5 % (ref 0.0–3.0)
Eosinophils Absolute: 0.1 10*3/uL (ref 0.0–0.7)
Eosinophils Relative: 2.5 % (ref 0.0–5.0)
HCT: 39.8 % (ref 39.0–52.0)
Hemoglobin: 13.3 g/dL (ref 13.0–17.0)
Lymphocytes Relative: 25.7 % (ref 12.0–46.0)
Lymphs Abs: 1.3 10*3/uL (ref 0.7–4.0)
MCHC: 33.4 g/dL (ref 30.0–36.0)
MCV: 97.2 fl (ref 78.0–100.0)
Monocytes Absolute: 0.3 10*3/uL (ref 0.1–1.0)
Monocytes Relative: 6.6 % (ref 3.0–12.0)
Neutro Abs: 3.2 10*3/uL (ref 1.4–7.7)
Neutrophils Relative %: 64.7 % (ref 43.0–77.0)
Platelets: 209 10*3/uL (ref 150.0–400.0)
RBC: 4.09 Mil/uL — ABNORMAL LOW (ref 4.22–5.81)
RDW: 13.5 % (ref 11.5–15.5)
WBC: 5 10*3/uL (ref 4.0–10.5)

## 2019-04-29 LAB — COMPREHENSIVE METABOLIC PANEL
ALT: 17 U/L (ref 0–53)
AST: 17 U/L (ref 0–37)
Albumin: 3.8 g/dL (ref 3.5–5.2)
Alkaline Phosphatase: 85 U/L (ref 39–117)
BUN: 18 mg/dL (ref 6–23)
CO2: 29 mEq/L (ref 19–32)
Calcium: 8.9 mg/dL (ref 8.4–10.5)
Chloride: 103 mEq/L (ref 96–112)
Creatinine, Ser: 1.04 mg/dL (ref 0.40–1.50)
GFR: 68.98 mL/min (ref >60.00)
Glucose, Bld: 125 mg/dL — ABNORMAL HIGH (ref 70–99)
Potassium: 3.6 mEq/L (ref 3.5–5.1)
Sodium: 140 mEq/L (ref 135–145)
Total Bilirubin: 0.7 mg/dL (ref 0.2–1.2)
Total Protein: 6.3 g/dL (ref 6.0–8.3)

## 2019-04-29 LAB — LDL CHOLESTEROL, DIRECT: Direct LDL: 56 mg/dL

## 2019-04-29 NOTE — Addendum Note (Signed)
Addended by: Tomi Likens on: 04/29/2019 08:23 AM   Modules accepted: Orders

## 2019-04-29 NOTE — Patient Instructions (Addendum)
Recommended follow up: 6 month physical- schedule before you leave  As always would love for you to quit smoking- one of the best things you can do for your health!   Please stop by lab before you go If you do not have mychart- we will call you about results within 5 business days of [place] receiving them.  If you have mychart- we will send your results within 3 business days of [place] receiving them.  If abnormal or we want to clarify a result, we will call or mychart you to make sure you receive the message.  If you have questions or concerns or don't hear within 5-7 days, please send [place] a message or call us.

## 2019-04-29 NOTE — Progress Notes (Signed)
Phone [phone] In person visit   Subjective:   Jonathon [name] is a 78 y.o. year old very pleasant male patient who presents for/with See problem oriented charting Chief Complaint  Patient presents with  . Follow-up  . Hyperlipidemia  . Anxiety    ROS- No chest pain or shortness of breath. No headache or blurry vision.    Past Medical History-  Patient Active Problem List   Diagnosis Date Noted  . GAD (generalized anxiety disorder)     Priority: High  . Cigarette smoker     Priority: High  . OSA on CPAP 12/03/2017    Priority: Medium  . Hyperlipidemia 01/28/2017    Priority: Medium  . Pulmonary nodules 12/26/2016    Priority: Medium  . Aortic atherosclerosis ([place]) 10/28/2016    Priority: Medium  . Neuropathy     Priority: Medium  . DDD (degenerative disc disease), lumbar     Priority: Medium  . Genetic testing 10/14/2016    Priority: Low  . Family history of colon cancer 10/02/2016    Priority: Low  . Family history of breast cancer     Priority: Low  . Hilar lymphadenopathy 09/23/2016    Priority: Low  . Lymph nodes enlarged 09/23/2016    Priority: Low  . Family history of genetic disease 08/28/2016    Priority: Low  . Macular degeneration 07/21/2016    Priority: Low  . Degenerative lumbar spinal stenosis 04/06/2019  . Jock itch 07/31/2017    Medications- reviewed and updated Current Outpatient Medications  Medication Sig Dispense Refill  . ALPRAZolam (XANAX) 0.5 MG tablet Take 1 tablet (0.5 mg total) by mouth 2 (two) times daily as needed for anxiety (1 refill per month). 35 tablet 4  . aspirin EC 81 MG tablet Take 81 mg by mouth daily.    Marland Kitchen atorvastatin (LIPITOR) 20 MG tablet Take 1 tablet (20 mg total) by mouth daily. 90 tablet 3  . gabapentin (NEURONTIN) 400 MG capsule TAKE 1 CAPSULE MIDDAY AND 2 CAPSULES AT BEDTIME. 270 capsule 5  . HYDROcodone-acetaminophen (NORCO) 5-325 MG tablet Take 1 tablet by mouth 2 (two) times daily as needed for moderate  pain. 20 tablet 0  . ketoconazole (NIZORAL) 2 % cream Apply 1 application topically daily. For jock itch up to 2 weeks 60 g 2  . meloxicam (MOBIC) 15 MG tablet TAKE 1 TABLET BY MOUTH EVERY DAY WITH EVENING MEAL 90 tablet 3  . Multiple Vitamins-Minerals (PRESERVISION AREDS 2 PO) Take 1 tablet by mouth daily.    . sertraline (ZOLOFT) 100 MG tablet Take 1.5 tablets (150 mg total) by mouth daily. (Patient taking differently: Take 50 mg by mouth daily. ) 135 tablet 3   No current facility-administered medications for this visit.      Objective:  BP 132/70   Pulse 69   Temp 97.9 F (36.6 C)   Ht 5\' 11"  (1.803 m)   Wt 215 lb 9.6 oz (97.8 kg)   SpO2 99%   BMI 30.07 kg/m  Gen: NAD, resting comfortably wearing mask and wearing back brace CV: RRR no murmurs rubs or gallops Lungs: CTAB no crackles, wheeze, rhonchi Ext: no edema Skin: warm, dry Neuro: normal gait and speech    Assessment and Plan   # GAD S: compliant with zoloft 150mg  but had dry mouth and cut down to 50mg . Using alprazolpam #35 per month as well.  He reports reasonable control today GAD 7 : Generalized Anxiety Score 04/29/2019 08/27/2018 07/31/2017 04/30/2017  Nervous, Anxious, on Edge 0 0 1 1  Control/stop worrying 0 0 0 1  Worry too much - different things 0 0 0 0  Trouble relaxing 0 0 0 0  Restless 0 0 0 0  Easily annoyed or irritable 0 1 1 1   Afraid - awful might happen 0 0 0 0  Total GAD 7 Score 0 1 2 3   Anxiety Difficulty - - Not difficult at all Somewhat difficult  A/P: Stable despite lower dose of zoloft- we had not been able to wean alprazolam even on higher dose of zoloft unfortunately -I am willing to refill Zoloft and alprazolam for up to 6 months after today  # Smoking S:smoking 8-10 cigs per day. Over 50 pack years.  Enrolled in lung cancer screening program-last done September 2020*-last done September 2020. A/P: Encouraged cessation-patient not ready to quit.  Continue lung cancer screening follow-up.   Get yearly urinalysis today  # Hyperlipidemia/aortic atherosclerosis S:compliant with atorvastatin 20mg . Lab Results  Component Value Date   CHOL 128 08/27/2018   HDL 46.70 08/27/2018   LDLCALC 64 08/27/2018   LDLDIRECT 72.0 12/03/2017   TRIG 88.0 08/27/2018   CHOLHDL 3 08/27/2018   With smoking history we have opted to continue aspirin as well. Alternatively zoloft increases bleeding risk- if any future bleeding issues would d/c aspirin Aortic atherosclerosis on prior imaging as well- reasonable for statin A/P: Well-controlled with LDL under 70-continue current medications.  Update direct LDL  # Neuropathy  S:continued issues with left foot related to back. Uses gabapentin 400 at lunch and 800 before bed- finds this helpful  A/P:  Stable- continue current medicine    %-Degenerative disc disease lumbar spine- follows with Dr. 12/05/2017 is on Mobic and hydrocodone. - Discussion with patient increase bleeding risk on meloxicam consider reduce dosing if possible  -Had a flareup of pain in early October-we tried a course of prednisone -He saw my colleague Dr. 08/29/2018 a week later with continued pain and used a short course of hydrocodone as well as cyclobenzaprine.  Updated lumbar films without compression fracture and refer to sports medicine-diagnosed as degenerative spinal stenosis -There was some concern about alcohol intake -MRI done April 18, 2019 and plan  for epidural- he had this and has noted improvement -Update CMP with long-term NSAID use-also update CBC given bleeding risk - he states didn't take flexeril much- we will remove from list- discussed risks with hydrocodone, flexeril, alprazolam - reminded not to take alprazolam and hydrocodone within 8 hours  Recommended follow up: 6 month physical- schedule before you leave  Lab/Order associations: not fasting   ICD-10-CM   1. GAD (generalized anxiety disorder)  F41.1   2. Hyperlipidemia, unspecified hyperlipidemia type  E78.5  CBC with Differential    Comprehensive metabolic panel    Direct LDL    POCT Urinalysis Dipstick (Automated)  3. Neuropathy  G62.9   4. Aortic atherosclerosis (HCC)  I70.0   5. Cigarette smoker  F17.210 POCT Urinalysis Dipstick (Automated)   Return precautions advised.  Corwin Levins, MD

## 2019-05-02 ENCOUNTER — Telehealth: Payer: Self-pay | Admitting: Family Medicine

## 2019-05-02 NOTE — Telephone Encounter (Signed)
Patient is calling back for lab results. Please advise CB- [phone]

## 2019-05-02 NOTE — Telephone Encounter (Signed)
Patient given lab results and documented in result note.

## 2019-05-05 DIAGNOSIS — H35033 Hypertensive retinopathy, bilateral: Secondary | ICD-10-CM | POA: Diagnosis not present

## 2019-05-05 DIAGNOSIS — H35373 Puckering of macula, bilateral: Secondary | ICD-10-CM | POA: Diagnosis not present

## 2019-05-05 DIAGNOSIS — H353132 Nonexudative age-related macular degeneration, bilateral, intermediate dry stage: Secondary | ICD-10-CM | POA: Diagnosis not present

## 2019-05-05 DIAGNOSIS — H2513 Age-related nuclear cataract, bilateral: Secondary | ICD-10-CM | POA: Diagnosis not present

## 2019-05-07 ENCOUNTER — Other Ambulatory Visit: Payer: Self-pay | Admitting: Family Medicine

## 2019-05-08 ENCOUNTER — Other Ambulatory Visit: Payer: Self-pay | Admitting: Family Medicine

## 2019-05-10 NOTE — Telephone Encounter (Signed)
Last fill: 12/27/2018 #35 rf 4 Last app 04/29/2019 NO F/U

## 2019-05-10 NOTE — Telephone Encounter (Signed)
Called pt to inform that refill was sent in and that Dr. Yong Channel would like him to schedule a f/u viist to continue getting refills. Pt verbalized understanding.

## 2019-05-10 NOTE — Telephone Encounter (Signed)
35 is supposed to last a month but he is filling sooner than that. Please remind him that #35 needs to last minimum of 30 days.   I would recommend at minimum virtual follow up in 3 months- at time of next refill

## 2019-05-30 ENCOUNTER — Ambulatory Visit (INDEPENDENT_AMBULATORY_CARE_PROVIDER_SITE_OTHER): Payer: Medicare Other | Admitting: Family Medicine

## 2019-05-30 ENCOUNTER — Other Ambulatory Visit: Payer: Self-pay

## 2019-05-30 ENCOUNTER — Encounter: Payer: Self-pay | Admitting: Family Medicine

## 2019-05-30 VITALS — BP 134/68 | HR 64 | Temp 98.5°F | Ht 71.0 in | Wt 215.4 lb

## 2019-05-30 DIAGNOSIS — F17210 Nicotine dependence, cigarettes, uncomplicated: Secondary | ICD-10-CM

## 2019-05-30 DIAGNOSIS — R030 Elevated blood-pressure reading, without diagnosis of hypertension: Secondary | ICD-10-CM | POA: Diagnosis not present

## 2019-05-30 DIAGNOSIS — F411 Generalized anxiety disorder: Secondary | ICD-10-CM

## 2019-05-30 MED ORDER — ALPRAZOLAM 0.5 MG PO TABS: ORAL_TABLET | ORAL | 2 refills | Status: DC

## 2019-05-30 MED ORDER — ESCITALOPRAM OXALATE 5 MG PO TABS: 5.0000 mg | ORAL_TABLET | Freq: Every day | ORAL | 5 refills | Status: DC

## 2019-05-30 NOTE — Progress Notes (Signed)
Phone [phone] In person visit   Subjective:   Jonathon [name] is a 78 y.o. year old very pleasant male patient who presents for/with See problem oriented charting Chief Complaint  Patient presents with  . Follow-up  . Hypertension    ROS- Review of Systems  Constitutional: Negative.   HENT: Negative.   Eyes: Negative.   Respiratory: Negative.   Cardiovascular: Negative.   Gastrointestinal: Negative.   Genitourinary: Negative.   Musculoskeletal: Negative.   Skin: Negative.   Neurological: Negative.   Endo/Heme/Allergies: Bruises/bleeds easily (on blood thinners).  -Aspirin  This visit occurred during the SARS-CoV-2 public health emergency.  Safety protocols were in place, including screening questions prior to the visit, additional usage of staff PPE, and extensive cleaning of exam room while observing appropriate contact time as indicated for disinfecting solutions.   Past Medical History-  Patient Active Problem List   Diagnosis Date Noted  . GAD (generalized anxiety disorder)     Priority: High  . Cigarette smoker     Priority: High  . OSA on CPAP 12/03/2017    Priority: Medium  . Hyperlipidemia 01/28/2017    Priority: Medium  . Pulmonary nodules 12/26/2016    Priority: Medium  . Aortic atherosclerosis ([place]) 10/28/2016    Priority: Medium  . Neuropathy     Priority: Medium  . DDD (degenerative disc disease), lumbar     Priority: Medium  . Genetic testing 10/14/2016    Priority: Low  . Family history of colon cancer 10/02/2016    Priority: Low  . Family history of breast cancer     Priority: Low  . Hilar lymphadenopathy 09/23/2016    Priority: Low  . Lymph nodes enlarged 09/23/2016    Priority: Low  . Family history of genetic disease 08/28/2016    Priority: Low  . Macular degeneration 07/21/2016    Priority: Low  . Degenerative lumbar spinal stenosis 04/06/2019  . Jock itch 07/31/2017    Medications- reviewed and updated Current Outpatient  Medications  Medication Sig Dispense Refill  . ALPRAZolam (XANAX) 0.5 MG tablet TAKE 1 TABLET BY MOUTH 2 (TWO) TIMES DAILY AS NEEDED FOR ANXIETY (1 REFILL PER MONTH). 37 tablet 2  . aspirin EC 81 MG tablet Take 81 mg by mouth daily.    Marland Kitchen atorvastatin (LIPITOR) 20 MG tablet Take 1 tablet (20 mg total) by mouth daily. 90 tablet 3  . gabapentin (NEURONTIN) 400 MG capsule TAKE 1 CAPSULE MIDDAY AND 2 CAPSULES AT BEDTIME. 270 capsule 5  . ketoconazole (NIZORAL) 2 % cream Apply 1 application topically daily. For jock itch up to 2 weeks 60 g 2  . meloxicam (MOBIC) 15 MG tablet TAKE 1 TABLET BY MOUTH EVERY DAY WITH EVENING MEAL 90 tablet 3  . Multiple Vitamins-Minerals (PRESERVISION AREDS 2 PO) Take 1 tablet by mouth daily.    Marland Kitchen escitalopram (LEXAPRO) 5 MG tablet Take 1 tablet (5 mg total) by mouth daily. 30 tablet 5  . HYDROcodone-acetaminophen (NORCO) 5-325 MG tablet Take 1 tablet by mouth 2 (two) times daily as needed for moderate pain. (Patient not taking: Reported on 05/30/2019) 20 tablet 0   No current facility-administered medications for this visit.     Objective:  BP 134/68 (BP Location: Left Arm, Patient Position: Sitting, Cuff Size: Large)   Pulse 64   Temp 98.5 F (36.9 C) (Temporal)   Ht 5\' 11"  (1.803 m)   Wt 215 lb 6.4 oz (97.7 kg)   SpO2 98%   BMI 30.04  kg/m  Gen: NAD, resting comfortably CV: RRR no murmurs rubs or gallops Lungs: CTAB no crackles, wheeze, rhonchi Ext: trace edema Skin: warm, dry Neuro: normal speech    Assessment and Plan   # GAD S: Previously compliant with zoloft 150mg  but had dry mouth and cut down to 50mg .Using alprazolpam #35 per month as well  Unfortunately patient's dry mouth was occurring even with 50 mg-he read the package label instruction which discussed dry mouth as possible side effect and he stopped the medication.  It seems like his anxiety has increased since he stopped the Zoloft-he does not feel like the medication was helpful but I  pointed out that the increased alprazolam use is likely directly related to reducing the Zoloft.  He does admit to Some increased anxiety with home remodel ongoing. A/P: Poor control today.  Discontinue Zoloft due to dry mouth.  I did not see this specifically listed on up-to-date but by comparison dry skin was 14% with Zoloft and only 4-9% with escitalopram-we opted to try escitalopram 5 mg with follow-up in 3 months or sooner if needed.  I did increase his alprazolam to 37 tablets-he feels like he has had increased anxiety with home remodel and also with transition to escitalopram.  I am hopeful he will be able to stay under #37 for a month-if not I encouraged him to call to schedule follow-up-also if he cannot tolerate escitalopram -Wellbutrin would be ideal for reducing desire to smoke but I am concerned it may increase anxiety -Did discuss increased bleeding risk on escitalopram and meloxicam together-in general would be best to reduce meloxicam usage  # Smoking S:smoking 10-15 cigs per day up from 8-10. Over 50 pack years.  Enrolled in lung cancer screening program-last done September 2020 A/P: I suspect the increase smoking is related to increased anxiety.  Encourage smoking cessation-patient is not ready to quit.  Hopefully we can move him toward this with better control of anxiety.  #Elevated blood pressure S: Patient with increased blood pressure at the dentist-has come down today.  He believes it may be related to anxiety he had from the actual visit A/P: Good control today-counseling was provided on blood pressure goals-ideally 138/88 or less on average before we need to consider medicine -Discussed meloxicam could raise blood pressure  #Hydrocodone use-had used this with back pain flareup in the past.  Not currently taking and will remove from medication list.  Recommended follow up: 3 months for anxiety follow up or sooner if you need  Lab/Order associations: No diagnosis  found.  Meds ordered this encounter  Medications  . escitalopram (LEXAPRO) 5 MG tablet    Sig: Take 1 tablet (5 mg total) by mouth daily.    Dispense:  30 tablet    Refill:  5  . ALPRAZolam (XANAX) 0.5 MG tablet    Sig: TAKE 1 TABLET BY MOUTH 2 (TWO) TIMES DAILY AS NEEDED FOR ANXIETY (1 REFILL PER MONTH).    Dispense:  37 tablet    Refill:  2    May refill 1st script within 20 days of last rx- we are slightly increasing total # of pills while starting escitalopram 5mg . He may shop outside of [facility] to avoid being in same building with covid testing.   Return precautions advised.  October 2020, MD

## 2019-05-30 NOTE — Patient Instructions (Addendum)
Blood pressure looks better today. If ever runs high- make sure to avoid taking meloxicam because that can raise blood pressure  Blood pressure goal is 138/88 or less  Try escitalopram 5 mg in place of zoloft/sertraline- hoping this helps overall anxiety  Slightly increasing # of alprazolam to #37 but really want this to last you for a whole month  If you have any thoughts of self harm on these medicines please call us immediately or call 911  Recommended follow up: 3 months for anxiety follow up or sooner if you need [place]

## 2019-09-02 ENCOUNTER — Other Ambulatory Visit: Payer: Self-pay | Admitting: Family Medicine

## 2019-09-05 NOTE — Telephone Encounter (Signed)
[place] Database Verified LR: 08-08-2019  Qty: 37  Last office visit: 05-30-2019 Upcoming appointment: 09-29-2019

## 2019-09-29 ENCOUNTER — Encounter: Payer: Self-pay | Admitting: Family Medicine

## 2019-09-29 ENCOUNTER — Other Ambulatory Visit: Payer: Self-pay

## 2019-09-29 ENCOUNTER — Ambulatory Visit (INDEPENDENT_AMBULATORY_CARE_PROVIDER_SITE_OTHER): Payer: Medicare Other | Admitting: Family Medicine

## 2019-09-29 VITALS — BP 118/64 | HR 59 | Temp 97.8°F | Ht 71.0 in | Wt 216.0 lb

## 2019-09-29 DIAGNOSIS — G629 Polyneuropathy, unspecified: Secondary | ICD-10-CM | POA: Diagnosis not present

## 2019-09-29 DIAGNOSIS — E785 Hyperlipidemia, unspecified: Secondary | ICD-10-CM

## 2019-09-29 DIAGNOSIS — M5136 Other intervertebral disc degeneration, lumbar region: Secondary | ICD-10-CM

## 2019-09-29 DIAGNOSIS — I700 Atherosclerosis of aorta: Secondary | ICD-10-CM

## 2019-09-29 DIAGNOSIS — F17210 Nicotine dependence, cigarettes, uncomplicated: Secondary | ICD-10-CM | POA: Diagnosis not present

## 2019-09-29 DIAGNOSIS — F411 Generalized anxiety disorder: Secondary | ICD-10-CM

## 2019-09-29 LAB — COMPREHENSIVE METABOLIC PANEL
ALT: 13 U/L (ref 0–53)
AST: 17 U/L (ref 0–37)
Albumin: 3.7 g/dL (ref 3.5–5.2)
Alkaline Phosphatase: 85 U/L (ref 39–117)
BUN: 15 mg/dL (ref 6–23)
CO2: 30 mEq/L (ref 19–32)
Calcium: 8.5 mg/dL (ref 8.4–10.5)
Chloride: 104 mEq/L (ref 96–112)
Creatinine, Ser: 1.03 mg/dL (ref 0.40–1.50)
GFR: 69.68 mL/min (ref >60.00)
Glucose, Bld: 86 mg/dL (ref 70–99)
Potassium: 4 mEq/L (ref 3.5–5.1)
Sodium: 139 mEq/L (ref 135–145)
Total Bilirubin: 0.5 mg/dL (ref 0.2–1.2)
Total Protein: 6.1 g/dL (ref 6.0–8.3)

## 2019-09-29 LAB — CBC WITH DIFFERENTIAL/PLATELET
Basophils Absolute: 0 10*3/uL (ref 0.0–0.1)
Basophils Relative: 0.3 % (ref 0.0–3.0)
Eosinophils Absolute: 0.5 10*3/uL (ref 0.0–0.7)
Eosinophils Relative: 12.4 % — ABNORMAL HIGH (ref 0.0–5.0)
HCT: 38 % — ABNORMAL LOW (ref 39.0–52.0)
Hemoglobin: 12.9 g/dL — ABNORMAL LOW (ref 13.0–17.0)
Lymphocytes Relative: 29.4 % (ref 12.0–46.0)
Lymphs Abs: 1.2 10*3/uL (ref 0.7–4.0)
MCHC: 34 g/dL (ref 30.0–36.0)
MCV: 97.4 fl (ref 78.0–100.0)
Monocytes Absolute: 0.5 10*3/uL (ref 0.1–1.0)
Monocytes Relative: 12.6 % — ABNORMAL HIGH (ref 3.0–12.0)
Neutro Abs: 1.8 10*3/uL (ref 1.4–7.7)
Neutrophils Relative %: 45.3 % (ref 43.0–77.0)
Platelets: 200 10*3/uL (ref 150.0–400.0)
RBC: 3.9 Mil/uL — ABNORMAL LOW (ref 4.22–5.81)
RDW: 13.3 % (ref 11.5–15.5)
WBC: 3.9 10*3/uL — ABNORMAL LOW (ref 4.0–10.5)

## 2019-09-29 LAB — LIPID PANEL
Cholesterol: 119 mg/dL (ref 0–200)
HDL: 35 mg/dL — ABNORMAL LOW (ref >39.00)
LDL Cholesterol: 58 mg/dL (ref 0–99)
NonHDL: 83.66
Total CHOL/HDL Ratio: 3
Triglycerides: 126 mg/dL (ref 0.0–149.0)
VLDL: 25.2 mg/dL (ref 0.0–40.0)

## 2019-09-29 MED ORDER — GABAPENTIN 400 MG PO CAPS: ORAL_CAPSULE | ORAL | 5 refills | Status: DC

## 2019-09-29 MED ORDER — ESCITALOPRAM OXALATE 5 MG PO TABS: 5.0000 mg | ORAL_TABLET | Freq: Every day | ORAL | 3 refills | Status: DC

## 2019-09-29 MED ORDER — ALPRAZOLAM 0.5 MG PO TABS: ORAL_TABLET | ORAL | 5 refills | Status: DC

## 2019-09-29 NOTE — Progress Notes (Signed)
Phone [phone] In person visit   Subjective:   Jonathon [name] is a 79 y.o. year old very pleasant male patient who presents for/with See problem oriented charting Chief Complaint  Patient presents with  . Anxiety  . Hyperlipidemia   This visit occurred during the SARS-CoV-2 public health emergency.  Safety protocols were in place, including screening questions prior to the visit, additional usage of staff PPE, and extensive cleaning of exam room while observing appropriate contact time as indicated for disinfecting solutions.   Past Medical History-  Patient Active Problem List   Diagnosis Date Noted  . GAD (generalized anxiety disorder)     Priority: High  . Cigarette smoker     Priority: High  . OSA on CPAP 12/03/2017    Priority: Medium  . Hyperlipidemia 01/28/2017    Priority: Medium  . Pulmonary nodules 12/26/2016    Priority: Medium  . Aortic atherosclerosis ([facility]) 10/28/2016    Priority: Medium  . Neuropathy     Priority: Medium  . DDD (degenerative disc disease), lumbar     Priority: Medium  . Genetic testing 10/14/2016    Priority: Low  . Family history of colon cancer 10/02/2016    Priority: Low  . Family history of breast cancer     Priority: Low  . Hilar lymphadenopathy 09/23/2016    Priority: Low  . Lymph nodes enlarged 09/23/2016    Priority: Low  . Family history of genetic disease 08/28/2016    Priority: Low  . Macular degeneration 07/21/2016    Priority: Low  . Degenerative lumbar spinal stenosis 04/06/2019  . Jock itch 07/31/2017    Medications- reviewed and updated Current Outpatient Medications  Medication Sig Dispense Refill  . ALPRAZolam (XANAX) 0.5 MG tablet TAKE 1 TABLET BY MOUTH TWICE A DAY AS NEEDED FOR ANXIETY 37 tablet 5  . aspirin EC 81 MG tablet Take 81 mg by mouth daily.    Marland Kitchen atorvastatin (LIPITOR) 20 MG tablet Take 1 tablet (20 mg total) by mouth daily. 90 tablet 3  . escitalopram (LEXAPRO) 5 MG tablet Take 1 tablet (5 mg  total) by mouth daily. 90 tablet 3  . gabapentin (NEURONTIN) 400 MG capsule 1 capsule midday and 2 capsules at bedtime 270 capsule 5  . ketoconazole (NIZORAL) 2 % cream Apply 1 application topically daily. For jock itch up to 2 weeks 60 g 2  . meloxicam (MOBIC) 15 MG tablet TAKE 1 TABLET BY MOUTH EVERY DAY WITH EVENING MEAL 90 tablet 3  . Multiple Vitamins-Minerals (PRESERVISION AREDS 2 PO) Take 1 tablet by mouth daily.     No current facility-administered medications for this visit.     Objective:  BP 118/64   Pulse (!) 59   Temp 97.8 F (36.6 C) (Temporal)   Ht 5\' 11"  (1.803 m)   Wt 216 lb (98 kg)   SpO2 99%   BMI 30.13 kg/m  Gen: NAD, resting comfortably CV: RRR no murmurs rubs or gallops Lungs: CTAB no crackles, wheeze, rhonchi Abdomen: soft/nontender/nondistended/normal bowel sounds. Ext: no edema Skin: warm, dry    Assessment and Plan   # GAD S: compliant with lexapro 5mg . Previously had dry mouth on zoloft 150mg  and cut to 50mg  and still had issues. Using alprazolam #37 per month as well A/P: reasonable control today- we will continue current dosage for now and see how he does- still would consider lexapro 10mg  at follow up and see if we can trend alprazolam usage down slightly- perhaps back  down to 35 tablets.  -discussed if he had any falls we would have to stop the medicine (alprazolam)  - does get bitter taste with xanax- I think ativan may be a better long term choice as well- may consider future visit - reports 1 beer a day- he states over 6 hours after alprazolam  # Smoking S:smoking 10-15 now up from  8-10 cigs per day. Over 50 pack years.  Enrolled in lung cancer screening program-last done September 2020  A/P:  Encouraged cessation or at least cutting back on tobacco intake  -continue lung cancer screening program  # Hyperlipidemia/aortic atherosclerosis/CAD on ct scan S:compliant with atorvastatin 20mg . With smoking history we have opted to continue aspirin  as well. Alternatively zoloft increases bleeding risk- if any future bleeding issues would d/c aspirin Aortic atherosclerosis on prior imaging as well- reasonable for statin Lab Results  Component Value Date   CHOL 128 08/27/2018   HDL 46.70 08/27/2018   LDLCALC 64 08/27/2018   LDLDIRECT 56.0 04/29/2019   TRIG 88.0 08/27/2018   CHOLHDL 3 08/27/2018  A/P: last LDL looked great at 64- update lipids and labs today. For now continue current meds    #mild headaches for years- not worsening- tylenol can help. BC powder helps. Encouraged tylenol over BC powder. No worsening headache pattern and has had for years and years- chance of malignancy is low  # Neuropathy  S:continued issues with left foot related to back. Uses gabapentin 400 at lunch and 800 before bed- finds this helpful  A/P:  Reasonable control- continue current meds- has trouble sleeping without takign the gabapentin   %-Degenerative disc disease lumbar spine- follows with Dr. 08/29/2018 is on Mobic and hydrocodone. - Discussion with patient increase bleeding risk on meloxicam consider reduce dosing if possible - also discussed kidney and heart risks -Had a flareup of pain in early October 2020-we tried a course of prednisone -He saw my colleague Dr. 06-29-2004 a week later with continued pain and used a short course of hydrocodone as well as cyclobenzaprine.  Updated lumbar films without compression fracture and refer to sports medicine-diagnosed as degenerative spinal stenosis -MRI done April 18, 2019 and plan  for epidural- he had this and has noted improvement -stays on meloxicam daily  # HM- had covid vaccines but he has to find dates and let April 20, 2019 know  Immunization History  Administered Date(s) Administered  . Fluad Quad(high Dose 65+) 02/10/2019  . Influenza, High Dose Seasonal PF 05/18/2015, 03/18/2016, 03/26/2017, 06/04/2018  . Influenza-Unspecified 05/18/2015, 03/18/2016, 04/30/2016  . Pneumococcal Conjugate-13 01/31/2014  .  Pneumococcal Polysaccharide-23 12/03/2017  . Tdap 03/21/2019  . Zoster 07/06/2013   Recommended follow up: Return in about 4 months (around 01/29/2020) for physical or sooner if needed.  Lab/Order associations: not fasting   ICD-10-CM   1. GAD (generalized anxiety disorder)  F41.1   2. Neuropathy  G62.9   3. Cigarette smoker  F17.210   4. Hyperlipidemia, unspecified hyperlipidemia type  E78.5 CBC with Differential/Platelet    Comprehensive metabolic panel    Lipid panel  5. Aortic atherosclerosis (HCC)  I70.0   6. DDD (degenerative disc disease), lumbar  M51.36     Meds ordered this encounter  Medications  . ALPRAZolam (XANAX) 0.5 MG tablet    Sig: TAKE 1 TABLET BY MOUTH TWICE A DAY AS NEEDED FOR ANXIETY    Dispense:  37 tablet    Refill:  5    Not to exceed 3 additional fills before 11/26/2019.  01/26/2020  escitalopram (LEXAPRO) 5 MG tablet    Sig: Take 1 tablet (5 mg total) by mouth daily.    Dispense:  90 tablet    Refill:  3  . gabapentin (NEURONTIN) 400 MG capsule    Sig: 1 capsule midday and 2 capsules at bedtime    Dispense:  270 capsule    Refill:  5   Return precautions advised.  Garret Reddish, MD

## 2019-09-29 NOTE — Patient Instructions (Addendum)
Glad you are doing ok- try to cut down on the alprazolam as able- would love if the 37 pills would stretch out more than a month  Recommended follow up: Return in about 4 months (around 01/29/2020) for physical or sooner if needed.     Please stop by lab before you go If you do not have mychart- we will call you about results within 5 business days of [place] receiving them.  If you have mychart- we will send your results within 3 business days of [place] receiving them.  If abnormal or we want to clarify a result, we will call or mychart you to make sure you receive the message.  If you have questions or concerns or don't hear within 5 business days, please send [place] a message or call us.

## 2019-09-29 NOTE — Addendum Note (Signed)
Addended by: Lieutenant Diego A on: 09/29/2019 10:30 AM   Modules accepted: Orders

## 2019-09-30 ENCOUNTER — Other Ambulatory Visit: Payer: Self-pay

## 2019-09-30 DIAGNOSIS — D649 Anemia, unspecified: Secondary | ICD-10-CM

## 2019-10-06 ENCOUNTER — Other Ambulatory Visit: Payer: Self-pay | Admitting: Family Medicine

## 2019-10-10 ENCOUNTER — Other Ambulatory Visit: Payer: Self-pay

## 2019-10-10 ENCOUNTER — Other Ambulatory Visit (INDEPENDENT_AMBULATORY_CARE_PROVIDER_SITE_OTHER): Payer: Medicare Other

## 2019-10-10 DIAGNOSIS — D649 Anemia, unspecified: Secondary | ICD-10-CM | POA: Diagnosis not present

## 2019-10-10 LAB — CBC WITH DIFFERENTIAL/PLATELET
Basophils Absolute: 0 10*3/uL (ref 0.0–0.1)
Basophils Relative: 1.2 % (ref 0.0–3.0)
Eosinophils Absolute: 0.4 10*3/uL (ref 0.0–0.7)
Eosinophils Relative: 9.1 % — ABNORMAL HIGH (ref 0.0–5.0)
HCT: 38.6 % — ABNORMAL LOW (ref 39.0–52.0)
Hemoglobin: 13.1 g/dL (ref 13.0–17.0)
Lymphocytes Relative: 26.9 % (ref 12.0–46.0)
Lymphs Abs: 1.1 10*3/uL (ref 0.7–4.0)
MCHC: 33.8 g/dL (ref 30.0–36.0)
MCV: 97.3 fl (ref 78.0–100.0)
Monocytes Absolute: 0.5 10*3/uL (ref 0.1–1.0)
Monocytes Relative: 11.8 % (ref 3.0–12.0)
Neutro Abs: 2.1 10*3/uL (ref 1.4–7.7)
Neutrophils Relative %: 51 % (ref 43.0–77.0)
Platelets: 189 10*3/uL (ref 150.0–400.0)
RBC: 3.97 Mil/uL — ABNORMAL LOW (ref 4.22–5.81)
RDW: 13.5 % (ref 11.5–15.5)
WBC: 4.1 10*3/uL (ref 4.0–10.5)

## 2019-10-10 NOTE — Progress Notes (Signed)
Notes from Dr. Durene Cal Anemia has improved.  I am okay with rechecking at next visit as long as stool cards are negative for blood

## 2019-12-06 ENCOUNTER — Other Ambulatory Visit (INDEPENDENT_AMBULATORY_CARE_PROVIDER_SITE_OTHER): Payer: Self-pay | Admitting: Specialist

## 2019-12-09 ENCOUNTER — Telehealth: Payer: Self-pay | Admitting: Pulmonary Disease

## 2019-12-09 NOTE — Telephone Encounter (Signed)
Spoke with pt. Advised him that I do not see where anyone from our office called him. Pt understood and apologized for his call. Nothing further was needed.

## 2019-12-09 NOTE — Telephone Encounter (Signed)
There are no new CT orders to schedule last CT was done 03/10/2019. It looks like it could be for [facility] CT but no new orders for that CT either. Last lung cancer screening CT was done 09/11/2017.

## 2020-02-08 ENCOUNTER — Encounter: Payer: Self-pay | Admitting: Pulmonary Disease

## 2020-02-08 ENCOUNTER — Ambulatory Visit: Payer: Medicare Other | Admitting: Pulmonary Disease

## 2020-02-08 ENCOUNTER — Other Ambulatory Visit: Payer: Self-pay

## 2020-02-08 DIAGNOSIS — F17210 Nicotine dependence, cigarettes, uncomplicated: Secondary | ICD-10-CM

## 2020-02-08 DIAGNOSIS — R918 Other nonspecific abnormal finding of lung field: Secondary | ICD-10-CM

## 2020-02-08 NOTE — Assessment & Plan Note (Signed)
Right lower lobe nodule appears stable from 20 18-20 20 likely benign We will follow up with chest x-ray next year

## 2020-02-08 NOTE — Patient Instructions (Signed)
Nodule seemed stable on CT scan in 2020, benign

## 2020-02-08 NOTE — Progress Notes (Signed)
   Subjective:    Patient ID: Jonathon [name], male    DOB: 01/08/1941, 79 y.o.   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  No dyspnea or cough.  He is up-to-date with immunizations He has increased smoking up to 3/4 pack/day.  We reviewed CT chest from 2020  Significant tests/ events reviewed  Screening CT chest on 09/10/16>>scattered pulmonary nodules measuring up to 16 mm the subpleural right lower lobe,another 8 mm subpleural nodule in the superiorsegment left lower lobe- RADS 4 B. 09/2016 PET scan-minimal metabolic activity in the right subpleural nodule indicating benign etiology.hypermetabolic right hilar lymph node-this lymph node was not noted on the earlier noncontrast CT.There was diffuse activity throughout the esophagus favoring esophagitis  09/2016 Spirometry- noevidence of airway obstruction with a ratio of 99 an FEV1 of 72% and FVC of 52% suggesting moderate restriction  08/2017  CT scan-lung nodules are either stable or decreased in size,most concerning was pleural based nodule in the periphery of right lower lobe-but even this is marginally decreased.There were calcified granulomas in the liver and spleen Right hilar lymphadenopathy stable but at 9 mm and left hilar lymph nodes appear calcified  CT chest 02/2019  Stable 15 mm pleural-based nodule in lateral right lower lobe  Review of Systems Patient denies significant dyspnea,cough, hemoptysis,  chest pain, palpitations, pedal edema, orthopnea, paroxysmal nocturnal dyspnea, lightheadedness, nausea, vomiting, abdominal or  leg pains      Objective:   Physical Exam  Gen. jocular,  elderly,well-nourished, in no distress ENT - no thrush, no pallor/icterus,no post nasal drip Neck: No JVD, no thyromegaly, no carotid bruits Lungs: no use of accessory muscles, no dullness to percussion, clear without rales or rhonchi  Cardiovascular: Rhythm regular, heart sounds  normal, no murmurs or gallops, no peripheral edema Musculoskeletal: No deformities, no cyanosis or clubbing         Assessment & Plan:

## 2020-02-08 NOTE — Assessment & Plan Note (Signed)
Smoking cessation emphasized is the most important intervention that will add years to his life.  He is not interested in making a quit attempt at this time. Prior spirometry did not show significant COPD, no bronchodilators required

## 2020-03-01 ENCOUNTER — Ambulatory Visit (INDEPENDENT_AMBULATORY_CARE_PROVIDER_SITE_OTHER): Payer: Medicare Other | Admitting: Family Medicine

## 2020-03-01 ENCOUNTER — Encounter: Payer: Self-pay | Admitting: Family Medicine

## 2020-03-01 ENCOUNTER — Other Ambulatory Visit: Payer: Self-pay

## 2020-03-01 VITALS — BP 118/78 | HR 58 | Temp 98.2°F | Resp 18 | Ht 70.0 in | Wt 213.6 lb

## 2020-03-01 DIAGNOSIS — Z23 Encounter for immunization: Secondary | ICD-10-CM

## 2020-03-01 DIAGNOSIS — G629 Polyneuropathy, unspecified: Secondary | ICD-10-CM | POA: Diagnosis not present

## 2020-03-01 DIAGNOSIS — M5432 Sciatica, left side: Secondary | ICD-10-CM

## 2020-03-01 DIAGNOSIS — M5136 Other intervertebral disc degeneration, lumbar region: Secondary | ICD-10-CM | POA: Diagnosis not present

## 2020-03-01 MED ORDER — PREDNISONE 20 MG PO TABS: ORAL_TABLET | ORAL | 0 refills | Status: DC

## 2020-03-01 NOTE — Progress Notes (Signed)
Phone [phone] In person visit   Subjective:   Jonathon [name] is a 79 y.o. year old very pleasant male patient who presents for/with See problem oriented charting Chief Complaint  Patient presents with  . Left leg pain   This visit occurred during the SARS-CoV-2 public health emergency.  Safety protocols were in place, including screening questions prior to the visit, additional usage of staff PPE, and extensive cleaning of exam room while observing appropriate contact time as indicated for disinfecting solutions.   Past Medical History-  Patient Active Problem List   Diagnosis Date Noted  . GAD (generalized anxiety disorder)     Priority: High  . Cigarette smoker     Priority: High  . OSA on CPAP 12/03/2017    Priority: Medium  . Hyperlipidemia 01/28/2017    Priority: Medium  . Pulmonary nodules 12/26/2016    Priority: Medium  . Aortic atherosclerosis (HCC) 10/28/2016    Priority: Medium  . Neuropathy     Priority: Medium  . DDD (degenerative disc disease), lumbar     Priority: Medium  . Genetic testing 10/14/2016    Priority: Low  . Family history of colon cancer 10/02/2016    Priority: Low  . Family history of breast cancer     Priority: Low  . Hilar lymphadenopathy 09/23/2016    Priority: Low  . Lymph nodes enlarged 09/23/2016    Priority: Low  . Family history of genetic disease 08/28/2016    Priority: Low  . Macular degeneration 07/21/2016    Priority: Low  . Degenerative lumbar spinal stenosis 04/06/2019  . Jock itch 07/31/2017    Medications- reviewed and updated Current Outpatient Medications  Medication Sig Dispense Refill  . ALPRAZolam (XANAX) 0.5 MG tablet TAKE 1 TABLET BY MOUTH TWICE A DAY AS NEEDED FOR ANXIETY 37 tablet 5  . aspirin EC 81 MG tablet Take 81 mg by mouth daily.    Marland Kitchen atorvastatin (LIPITOR) 20 MG tablet TAKE 1 TABLET BY MOUTH EVERY DAY 90 tablet 3  . escitalopram (LEXAPRO) 5 MG tablet Take 1 tablet (5 mg total) by mouth daily. 90  tablet 3  . gabapentin (NEURONTIN) 400 MG capsule 1 capsule midday and 2 capsules at bedtime 270 capsule 5  . ketoconazole (NIZORAL) 2 % cream Apply 1 application topically daily. For jock itch up to 2 weeks 60 g 2  . meloxicam (MOBIC) 15 MG tablet TAKE 1 TABLET BY MOUTH EVERY DAY WITH EVENING MEAL 90 tablet 3  . Multiple Vitamins-Minerals (PRESERVISION AREDS 2 PO) Take 1 tablet by mouth daily.    . predniSONE (DELTASONE) 20 MG tablet Take 2 pills for 3 days, 1 pill for 4 days 10 tablet 0   No current facility-administered medications for this visit.     Objective:  BP 118/78   Pulse (!) 58   Temp 98.2 F (36.8 C) (Temporal)   Resp 18   Ht 5\' 10"  (1.778 m)   Wt 213 lb 9.6 oz (96.9 kg)   SpO2 98%   BMI 30.65 kg/m  Gen: NAD, resting comfortably Back - Normal skin, Spine with normal alignment and no deformity.  No tenderness to vertebral process palpation.  Paraspinous muscles are not tender and without spasm.   Range of motion is full at neck and lumbar sacral regions. Negative Straight leg raise.  Neuro- no saddle anesthesia, 5/5 strength lower extremities     Assessment and Plan  #Left calf/leg/hip Pain S: Patient mentioned that the pain started a  month ago and hasn't gotten any better. Tried to take it easy but has not improved significantly. If rests hard for 2 days he can feel better for a day or so but then worsens. Has not had back pain since last injection. Current pain starts in his lefthis hip and radiates down into calf on the left. Baseline neuropathy is somewhat worse.   No calf swelling. No history of DVT/PE.   Still on gabapentin 400mg - 1 miday and 2 at bedtime. Also on meloxicam through Dr. - he is worried inflammatin will worsen off of this (does have GI bleeding risk)  ROS-No saddle anesthesia, bladder incontinence, fecal incontinence, weakness in extremity. Some numbness or tingling in extremity- in left and in feet- slightly worse. History negative for  trauma, history of cancer, fever, chills, unintentional weight loss, recent bacterial infection, recent IV drug use, HIV, pain worse at night or while supine.   A/P: This appears to be left-sided sciatica/potential pinched nerve.  Exam is not overly impressive and straight leg raise test is negative we will trial prednisone for 7 days.  We discussed hopefully would see some improvement in 2 to 3 days but full benefit may take up to 2 weeks.  If he is not making some significant strides by early October-recommended follow-up with Dr. Otelia Sergeant.  No calf pain or swelling to suggest DVT and with radiation of pain from upper to lower leg and history of degenerative disc disease I think DVT is far less likely-if he has swelling in the calf or pain with palpation the calf should let Otelia Sergeant know  We also discussed smoking strong plan for him encouraged him to quit smoking  # Neuropathy  S:continued issues with left foot related to back. Uses gabapentin 400 at lunch and 800 before bed- finds this helpful  A/P: Neuropathy symptoms have worsened since sciatica has started-hoping treatment with prednisone will help with this as above.  Continue gabapentin and start prednisone  #%-Degenerative disc disease lumbar spine- follows with Dr. [place] is on Mobic and hydrocodone. S: -Patient has also seen sports medicine in the past and was diagnosed with degenerative spinal stenosis. -MRI done April 18, 2019 and plan  for epidural- he had this and has noted improvement -Patient denies increased back pain with left leg pain  A/P: Degenerative disc disease/spinal stenosis could contribute to patient's pain patterns and predispose to pinched nerve/sciatica.  I am not going to stop his meloxicam (we have previously discussed increased bleeding risk on meloxicam particularly with aspirin as well as Lexapro) while on prednisone but did ask him to keep an eye on blood pressure and warned about increased blood sugars and  potential sleep issues. -We have a follow-up visit in October and we will check in at that time to see if he has had improvement or had to see Dr. November  Recommended follow up: Keep October visit or see November sooner if needed Future Appointments  Date Time Provider Department Center  04/05/2020  8:20 AM 04/07/2020, MD [facility]    Lab/Order associations:   ICD-10-CM   1. Sciatica of left side  M54.32   2. Neuropathy  G62.9   3. DDD (degenerative disc disease), lumbar  M51.36     Meds ordered this encounter  Medications  . predniSONE (DELTASONE) 20 MG tablet    Sig: Take 2 pills for 3 days, 1 pill for 4 days    Dispense:  10 tablet    Refill:  0  Return precautions advised.  Garret Reddish, MD

## 2020-03-01 NOTE — Patient Instructions (Addendum)
Health Maintenance Due  Topic Date Due  . INFLUENZA VACCINE In office flu shot. - high dose 01/15/2020   I agree with you-this could be a pinched nerve.  Lets try prednisone for 7 days.  If no improvement after 7-14 days please call Dr. Otelia Sergeant and get a follow-up appointment. If worsening symptoms seek care sooner  As always encourage you to quit smoking! This can reduce inflammation as well  Recommended follow up: see you at October visit

## 2020-03-01 NOTE — Addendum Note (Signed)
Addended by: Manuela Schwartz on: 03/01/2020 11:39 AM   Modules accepted: Orders

## 2020-03-19 ENCOUNTER — Other Ambulatory Visit: Payer: Self-pay | Admitting: Family Medicine

## 2020-03-28 ENCOUNTER — Encounter: Payer: Self-pay | Admitting: Surgery

## 2020-03-28 ENCOUNTER — Ambulatory Visit: Payer: Medicare Other | Admitting: Surgery

## 2020-03-28 ENCOUNTER — Ambulatory Visit: Payer: Self-pay

## 2020-03-28 VITALS — BP 108/72 | HR 70

## 2020-03-28 DIAGNOSIS — M5442 Lumbago with sciatica, left side: Secondary | ICD-10-CM | POA: Diagnosis not present

## 2020-03-28 DIAGNOSIS — M4726 Other spondylosis with radiculopathy, lumbar region: Secondary | ICD-10-CM | POA: Diagnosis not present

## 2020-03-28 NOTE — Progress Notes (Signed)
Office Visit Note   Patient: Jonathon [name]           Date of Birth: 06-23-1940           MRN: [id number] Visit Date: 03/28/2020              Requested by: Marin Olp, MD [place],  [place] [id number] PCP: Marin Olp, MD   Assessment & Plan: Visit Diagnoses:  1. Left-sided low back pain with left-sided sciatica, unspecified chronicity   2. Other spondylosis with radiculopathy, lumbar region     Plan: With patient's ongoing worsening symptoms refilled conservative treatment with lumbar ESI's recommend repeating lumbar MRI to rule out HNP/stenosis.  We will have patient follow-up with Dr. Louanne Skye after completion to discuss results and further treatment options.  Follow-Up Instructions: Return in about 2 weeks (around 04/11/2020) for with dr Louanne Skye to review lumbar MRI scan.   Orders:  Orders Placed This Encounter  Procedures  . XR Lumbar Spine Complete  . MR LUMBAR SPINE WO CONTRAST   No orders of the defined types were placed in this encounter.     Procedures: No procedures performed   Clinical Data: No additional findings.   Subjective: Chief Complaint  Patient presents with  . Lower Back - Pain    HPI 79 year old white male with known history of lumbar spondylosis comes in with complaints of low back pain and left lower extremity radiculopathy.  Patient states that symptoms have been worse the last couple months.  Pain radiates down to his left foot.  No right leg symptoms.  He had lumbar MRI scan performed April 18, 2019 and this showed:  CLINICAL DATA:  Low back pain for 7 weeks with no known injury. Radiculopathy with side not specified  EXAM: MRI LUMBAR SPINE WITHOUT CONTRAST  TECHNIQUE: Multiplanar, multisequence MR imaging of the lumbar spine was performed. No intravenous contrast was administered.  COMPARISON:  04/01/2019  FINDINGS: Segmentation: Based on PET-CT April 2018 there is a transitional  S1 vertebra  Alignment: Grade 1 anterolisthesis at L4-5. Mild levo scoliotic curvature  Vertebrae:  No fracture, evidence of discitis, or bone lesion.  Conus medullaris and cauda equina: Conus extends to the L2 level. Conus and cauda equina appear normal.  Paraspinal and other soft tissues: Negative  Disc levels:  T12- L1: Spondylosis with intervertebral ankylosis  L1-L2: Spondylosis with ventral spurring.  No impingement  L2-L3: Spondylosis with mild disc narrowing and bulge. Negative facets. No impingement  L3-L4: Moderate disc narrowing with asymmetric right-sided height loss and endplate degeneration. Right preferential disc bulging and endplate spurring. Moderate right facet spurring. Right foraminal stenosis is mild to moderate. Patent spinal canal  L4-L5: Disc narrowing and bulging. Moderate degenerative facet spurring. Moderate spinal stenosis.  L5-S1:Disc narrowing and bulging asymmetric to the left. Asymmetric left degenerative facet spurring. Moderate spinal stenosis. Asymmetric left subarticular recess narrowing impinging on the S1 nerve root. Moderate right subarticular recess narrowing. Mild to moderate left foraminal narrowing.  S1-2: Degenerative facet spurring that is moderate. Mild disc narrowing and bulging. No neural impingement.  IMPRESSION: 1. Transitional S1 vertebra with open S1-2 interspace. 2. Diffuse disc and facet degeneration with mild levo scoliotic curvature and L4-5 anterolisthesis. 3. L4-5 moderate spinal stenosis. 4. L5-S1 moderate spinal stenosis. There is left more than right subarticular recess stenosis and S1 impingement. 5. Mild borderline moderate foraminal narrowing on the left at L5-S1 and right at L3-4.   Electronically Signed   By:  Monte Fantasia M.D.   On: 04/18/2019 07:58  He had lumbar ESI with temporary improvement.  Primary care physician recently gave him a prednisone taper that gave minimal  improvement.  Currently pain is described as being constant.  Walking distances have decreased due to his symptoms. Review of Systems No current cardiac pulmonary GI GU issues  Objective: Vital Signs: BP 108/72   Pulse 70   Physical Exam HENT:     Head: Normocephalic.  Eyes:     Extraocular Movements: Extraocular movements intact.     Pupils: Pupils are equal, round, and reactive to light.  Pulmonary:     Effort: No respiratory distress.  Musculoskeletal:     Comments: Gait is somewhat antalgic.  Bilateral lumbar paraspinal tenderness.  Positive left straight leg raise.  Positive left sciatic notch tenderness.  No focal motor deficits.  Neurological:     Mental Status: He is alert and oriented to person, place, and time.  Psychiatric:        Mood and Affect: Mood normal.     Ortho Exam  Specialty Comments:  No specialty comments available.  Imaging: No results found.   PMFS History: Patient Active Problem List   Diagnosis Date Noted  . Degenerative lumbar spinal stenosis 04/06/2019  . OSA on CPAP 12/03/2017  . Jock itch 07/31/2017  . Hyperlipidemia 01/28/2017  . Pulmonary nodules 12/26/2016  . Aortic atherosclerosis ([place]) 10/28/2016  . Genetic testing 10/14/2016  . Family history of colon cancer 10/02/2016  . Family history of breast cancer   . Hilar lymphadenopathy 09/23/2016  . Lymph nodes enlarged 09/23/2016  . Family history of genetic disease 08/28/2016  . Macular degeneration 07/21/2016  . Neuropathy   . DDD (degenerative disc disease), lumbar   . GAD (generalized anxiety disorder)   . Cigarette smoker    Past Medical History:  Diagnosis Date  . Anxiety    never tried on SSRI. xanax 0.72m BID through Dr. BTollie Pizza   . DDD (degenerative disc disease), lumbar    lumbar DDD- follows with orthopedist Dr. NLinus Ornortho. mobic 118mdaily and hydrocodone 5/325 prn  . Family history of breast cancer   . Macular degeneration 07/21/2016   Follows with  optho- areds2. He states he is unsure of diagnosis.   . Neuropathy    unknown cause ? back related(sees ortho for back). burning sensation in feet. gabapentin 10032mfter dinner, 2 before bed  . Smoker    8-10 cigarettes a day. plus nicorette. 60 years. over 50 pack years  . Venous stasis     in the past- Wound center on legs. Elevate. Compression stockings.     Family History  Problem Relation Age of Onset  . Other Mother        "old age" per patient   . Dementia Mother   . Alcohol abuse Father        contributed  . Cancer Sister        unknown type  . Alcohol abuse Brother   . Breast cancer Sister        dx in her early 30s40s Other Sister        PALB2+  . Breast cancer Maternal Aunt   . Colon cancer Maternal Uncle   . Breast cancer Other 39    Past Surgical History:  Procedure Laterality Date  . ESOPHAGOGASTRODUODENOSCOPY Left 05/28/2016    Surgeon: JyoJuanita CraverD; food stuck in esophagus   Social History   Occupational  History  . Not on file  Tobacco Use  . Smoking status: Current Every Day Smoker    Packs/day: 1.00    Years: 62.00    Pack years: 62.00    Types: Cigarettes    Start date: 20  . Smokeless tobacco: Never Used  . Tobacco comment: 02/10/19- .5 pack   Substance and Sexual Activity  . Alcohol use: Yes    Comment: rare drinking occasionally  . Drug use: No  . Sexual activity: Not on file

## 2020-03-30 ENCOUNTER — Ambulatory Visit (INDEPENDENT_AMBULATORY_CARE_PROVIDER_SITE_OTHER): Payer: Medicare Other

## 2020-03-30 ENCOUNTER — Other Ambulatory Visit: Payer: Self-pay

## 2020-03-30 DIAGNOSIS — Z0000 Encounter for general adult medical examination without abnormal findings: Secondary | ICD-10-CM | POA: Diagnosis not present

## 2020-03-30 NOTE — Patient Instructions (Signed)
[name] , Thank you for taking time to come for your Medicare Wellness Visit. I appreciate your ongoing commitment to your health goals. Please review the following plan we discussed and let me know if I can assist you in the future.   Screening recommendations/referrals: Colonoscopy: No longer required  Recommended yearly ophthalmology/optometry visit for glaucoma screening and checkup Recommended yearly dental visit for hygiene and checkup  Vaccinations: Influenza vaccine: Done 03/01/20 Pneumococcal vaccine: Up to date Tdap vaccine: Up to date Shingles vaccine: Shingrix discussed. Please contact your pharmacy for coverage information.    Covid-19: Completed 1/21 7 08/04/19  Advanced directives: Son will handle this information  Conditions/risks identified: Stay Healthy  Next appointment: Follow up in one year for your annual wellness visit.   Preventive Care 49 Years and Older, Male Preventive care refers to lifestyle choices and visits with your health care provider that can promote health and wellness. What does preventive care include?  A yearly physical exam. This is also called an annual well check.  Dental exams once or twice a year.  Routine eye exams. Ask your health care provider how often you should have your eyes checked.  Personal lifestyle choices, including:  Daily care of your teeth and gums.  Regular physical activity.  Eating a healthy diet.  Avoiding tobacco and drug use.  Limiting alcohol use.  Practicing safe sex.  Taking low doses of aspirin every day.  Taking vitamin and mineral supplements as recommended by your health care provider. What happens during an annual well check? The services and screenings done by your health care provider during your annual well check will depend on your age, overall health, lifestyle risk factors, and family history of disease. Counseling  Your health care provider may ask you questions about  your:  Alcohol use.  Tobacco use.  Drug use.  Emotional well-being.  Home and relationship well-being.  Sexual activity.  Eating habits.  History of falls.  Memory and ability to understand (cognition).  Work and work Astronomer. Screening  You may have the following tests or measurements:  Height, weight, and BMI.  Blood pressure.  Lipid and cholesterol levels. These may be checked every 5 years, or more frequently if you are over 41 years old.  Skin check.  Lung cancer screening. You may have this screening every year starting at age 71 if you have a 30-pack-year history of smoking and currently smoke or have quit within the past 15 years.  Fecal occult blood test (FOBT) of the stool. You may have this test every year starting at age 44.  Flexible sigmoidoscopy or colonoscopy. You may have a sigmoidoscopy every 5 years or a colonoscopy every 10 years starting at age 2.  Prostate cancer screening. Recommendations will vary depending on your family history and other risks.  Hepatitis C blood test.  Hepatitis B blood test.  Sexually transmitted disease (STD) testing.  Diabetes screening. This is done by checking your blood sugar (glucose) after you have not eaten for a while (fasting). You may have this done every 1-3 years.  Abdominal aortic aneurysm (AAA) screening. You may need this if you are a current or former smoker.  Osteoporosis. You may be screened starting at age 57 if you are at high risk. Talk with your health care provider about your test results, treatment options, and if necessary, the need for more tests. Vaccines  Your health care provider may recommend certain vaccines, such as:  Influenza vaccine. This is recommended every  year.  Tetanus, diphtheria, and acellular pertussis (Tdap, Td) vaccine. You may need a Td booster every 10 years.  Zoster vaccine. You may need this after age 5.  Pneumococcal 13-valent conjugate (PCV13) vaccine.  One dose is recommended after age 79.  Pneumococcal polysaccharide (PPSV23) vaccine. One dose is recommended after age 78. Talk to your health care provider about which screenings and vaccines you need and how often you need them. This information is not intended to replace advice given to you by your health care provider. Make sure you discuss any questions you have with your health care provider. Document Released: 06/29/2015 Document Revised: 02/20/2016 Document Reviewed: 04/03/2015 Elsevier Interactive Patient Education  2017 [place] Prevention in the Home Falls can cause injuries. They can happen to people of all ages. There are many things you can do to make your home safe and to help prevent falls. What can I do on the outside of my home?  Regularly fix the edges of walkways and driveways and fix any cracks.  Remove anything that might make you trip as you walk through a door, such as a raised step or threshold.  Trim any bushes or trees on the path to your home.  Use bright outdoor lighting.  Clear any walking paths of anything that might make someone trip, such as rocks or tools.  Regularly check to see if handrails are loose or broken. Make sure that both sides of any steps have handrails.  Any raised decks and porches should have guardrails on the edges.  Have any leaves, snow, or ice cleared regularly.  Use sand or salt on walking paths during winter.  Clean up any spills in your garage right away. This includes oil or grease spills. What can I do in the bathroom?  Use night lights.  Install grab bars by the toilet and in the tub and shower. Do not use towel bars as grab bars.  Use non-skid mats or decals in the tub or shower.  If you need to sit down in the shower, use a plastic, non-slip stool.  Keep the floor dry. Clean up any water that spills on the floor as soon as it happens.  Remove soap buildup in the tub or shower regularly.  Attach bath  mats securely with double-sided non-slip rug tape.  Do not have throw rugs and other things on the floor that can make you trip. What can I do in the bedroom?  Use night lights.  Make sure that you have a light by your bed that is easy to reach.  Do not use any sheets or blankets that are too big for your bed. They should not hang down onto the floor.  Have a firm chair that has side arms. You can use this for support while you get dressed.  Do not have throw rugs and other things on the floor that can make you trip. What can I do in the kitchen?  Clean up any spills right away.  Avoid walking on wet floors.  Keep items that you use a lot in easy-to-reach places.  If you need to reach something above you, use a strong step stool that has a grab bar.  Keep electrical cords out of the way.  Do not use floor polish or wax that makes floors slippery. If you must use wax, use non-skid floor wax.  Do not have throw rugs and other things on the floor that can make you trip. What can  I do with my stairs?  Do not leave any items on the stairs.  Make sure that there are handrails on both sides of the stairs and use them. Fix handrails that are broken or loose. Make sure that handrails are as long as the stairways.  Check any carpeting to make sure that it is firmly attached to the stairs. Fix any carpet that is loose or worn.  Avoid having throw rugs at the top or bottom of the stairs. If you do have throw rugs, attach them to the floor with carpet tape.  Make sure that you have a light switch at the top of the stairs and the bottom of the stairs. If you do not have them, ask someone to add them for you. What else can I do to help prevent falls?  Wear shoes that:  Do not have high heels.  Have rubber bottoms.  Are comfortable and fit you well.  Are closed at the toe. Do not wear sandals.  If you use a stepladder:  Make sure that it is fully opened. Do not climb a closed  stepladder.  Make sure that both sides of the stepladder are locked into place.  Ask someone to hold it for you, if possible.  Clearly mark and make sure that you can see:  Any grab bars or handrails.  First and last steps.  Where the edge of each step is.  Use tools that help you move around (mobility aids) if they are needed. These include:  Canes.  Walkers.  Scooters.  Crutches.  Turn on the lights when you go into a dark area. Replace any light bulbs as soon as they burn out.  Set up your furniture so you have a clear path. Avoid moving your furniture around.  If any of your floors are uneven, fix them.  If there are any pets around you, be aware of where they are.  Review your medicines with your doctor. Some medicines can make you feel dizzy. This can increase your chance of falling. Ask your doctor what other things that you can do to help prevent falls. This information is not intended to replace advice given to you by your health care provider. Make sure you discuss any questions you have with your health care provider. Document Released: 03/29/2009 Document Revised: 11/08/2015 Document Reviewed: 07/07/2014 Elsevier Interactive Patient Education  2017 [employer].

## 2020-03-30 NOTE — Progress Notes (Signed)
Virtual Visit via Telephone Note  I connected with  Criss Alvine on 03/30/20 at  3:15 PM EDT by telephone and verified that I am speaking with the correct person using two identifiers.  Medicare Annual Wellness visit completed telephonically due to Covid-19 pandemic.   Persons participating in this call: This Health Coach and this patient.   Location: Patient: Home Provider: Office   I discussed the limitations, risks, security and privacy concerns of performing an evaluation and management service by telephone and the availability of in person appointments. The patient expressed understanding and agreed to proceed.  Unable to perform video visit due to video visit attempted and failed and/or patient does not have video capability.   Some vital signs may be absent or patient reported.   Willette Brace, LPN    Subjective:   JESTIN BURBACH is a 79 y.o. male who presents for Medicare Annual/Subsequent preventive examination.  Review of Systems     Cardiac Risk Factors include: male gender;dyslipidemia;obesity (BMI >30kg/m2)     Objective:    Today's Vitals   03/30/20 1519  PainSc: 8    There is no height or weight on file to calculate BMI.  Advanced Directives 03/30/2020 03/16/2019 12/03/2017 10/28/2016 10/28/2016 05/28/2016  Does Patient Have a Medical Advance Directive? No Yes Yes (No Data) No No  Type of Advance Directive - Living will;Healthcare Power of Attorney - - - -  Does patient want to make changes to medical advance directive? - No - Patient declined - - - -  Copy of [place] in Chart? - No - copy requested - - - -  Would patient like information on creating a medical advance directive? (No Data) - - - - No - Patient declined    Current Medications (verified) Outpatient Encounter Medications as of 03/30/2020  Medication Sig  . ALPRAZolam (XANAX) 0.5 MG tablet TAKE 1 TABLET BY MOUTH TWICE A DAY AS NEEDED FOR ANXIETY  . aspirin EC 81 MG  tablet Take 81 mg by mouth daily.  Marland Kitchen atorvastatin (LIPITOR) 20 MG tablet TAKE 1 TABLET BY MOUTH EVERY DAY  . escitalopram (LEXAPRO) 5 MG tablet Take 1 tablet (5 mg total) by mouth daily.  Marland Kitchen gabapentin (NEURONTIN) 400 MG capsule 1 capsule midday and 2 capsules at bedtime  . ketoconazole (NIZORAL) 2 % cream Apply 1 application topically daily. For jock itch up to 2 weeks  . meloxicam (MOBIC) 15 MG tablet TAKE 1 TABLET BY MOUTH EVERY DAY WITH EVENING MEAL  . Multiple Vitamins-Minerals (PRESERVISION AREDS 2 PO) Take 1 tablet by mouth daily.  . [DISCONTINUED] predniSONE (DELTASONE) 20 MG tablet Take 2 pills for 3 days, 1 pill for 4 days (Patient not taking: Reported on 03/30/2020)   No facility-administered encounter medications on file as of 03/30/2020.    Allergies (verified) Penicillins   History: Past Medical History:  Diagnosis Date  . Anxiety    never tried on SSRI. xanax 0.22m BID through Dr. BTollie Pizza   . DDD (degenerative disc disease), lumbar    lumbar DDD- follows with orthopedist Dr. NLinus Ornortho. mobic 190mdaily and hydrocodone 5/325 prn  . Family history of breast cancer   . Macular degeneration 07/21/2016   Follows with optho- areds2. He states he is unsure of diagnosis.   . Neuropathy    unknown cause ? back related(sees ortho for back). burning sensation in feet. gabapentin 10048mfter dinner, 2 before bed  . Smoker    8-10  cigarettes a day. plus nicorette. 60 years. over 50 pack years  . Venous stasis     in the past- Wound center on legs. Elevate. Compression stockings.    Past Surgical History:  Procedure Laterality Date  . ESOPHAGOGASTRODUODENOSCOPY Left 05/28/2016    Surgeon: Juanita Craver, MD; food stuck in esophagus   Family History  Problem Relation Age of Onset  . Other Mother        "old age" per patient   . Dementia Mother   . Alcohol abuse Father        contributed  . Cancer Sister        unknown type  . Alcohol abuse Brother   . Breast  cancer Sister        dx in her early 33s  . Other Sister        PALB2+  . Breast cancer Maternal Aunt   . Colon cancer Maternal Uncle   . Breast cancer Other 92   Social History   Socioeconomic History  . Marital status: Married    Spouse name: Not on file  . Number of children: Not on file  . Years of education: Not on file  . Highest education level: Not on file  Occupational History  . Occupation: semi retired  Tobacco Use  . Smoking status: Current Every Day Smoker    Packs/day: 1.00    Years: 62.00    Pack years: 62.00    Types: Cigarettes    Start date: 61  . Smokeless tobacco: Never Used  . Tobacco comment: 02/10/19- .5 pack   Substance and Sexual Activity  . Alcohol use: Yes    Comment: rare drinking occasionally  . Drug use: No  . Sexual activity: Not on file  Other Topics Concern  . Not on file  Social History Narrative   Married around 1964. 3 children. 6 grandkids.       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 Difficulty of Paying Living Expenses: Not hard at all  Food Insecurity: No Food Insecurity  . Worried About Charity fundraiser in the Last Year: Never true  . Ran Out of Food in the Last Year: Never true  Transportation Needs: No Transportation Needs  . Lack of Transportation (Medical): No  . Lack of Transportation (Non-Medical): No  Physical Activity: Inactive  . Days of Exercise per Week: 0 days  . Minutes of Exercise per Session: 0 min  Stress: No Stress Concern Present  . Feeling of Stress : Not at all  Social Connections: Moderately Isolated  . Frequency of Communication with Friends and Family: Three times a week  . Frequency of Social Gatherings with Friends and Family: Once a week  . Attends Religious Services: Never  . Active Member of Clubs or Organizations: No  . Attends Archivist  Meetings: Never  . Marital Status: Married    Tobacco Counseling Ready to quit: Not Answered Counseling given: Not Answered Comment: 02/10/19- .5 pack    Clinical Intake:  Pre-visit preparation completed: Yes  Pain : 0-10 Pain Score: 8  Pain Type: Chronic pain Pain Location: Leg Pain Orientation: Left Pain Descriptors / Indicators: Sore, Aching     BMI - recorded: 30.65 Nutritional Status: BMI > 30  Obese Nutritional Risks: None Diabetes: No  How often do you need to have someone help you  when you read instructions, pamphlets, or other written materials from your doctor or pharmacy?: 1 - Never  Diabetic?No  Interpreter Needed?: No  Information entered by :: Charlott Rakes, LPN   Activities of Daily Living In your present state of health, do you have any difficulty performing the following activities: 03/30/2020 05/30/2019  Hearing? N N  Vision? N N  Difficulty concentrating or making decisions? N N  Walking or climbing stairs? Y N  Dressing or bathing? N N  Doing errands, shopping? N N  Preparing Food and eating ? N -  Using the Toilet? N -  In the past six months, have you accidently leaked urine? N -  Do you have problems with loss of bowel control? N -  Managing your Medications? N -  Managing your Finances? N -  Housekeeping or managing your Housekeeping? N -  Some recent data might be hidden    Patient Care Team: Marin Olp, MD as PCP - General (Family Medicine) Rigoberto Noel, MD as Consulting Physician (Pulmonary Disease) Monna Fam, MD as Consulting Physician (Ophthalmology) Juanita Craver, MD as Consulting Physician (Gastroenterology) [facility] as Consulting Physician  Indicate any recent Medical Services you may have received from other than [facility] providers in the past year (date may be approximate).     Assessment:   This is a routine wellness examination for Dmoni.  Hearing/Vision screen  Hearing Screening   125Hz  250Hz 500Hz 1000Hz 2000Hz 3000Hz 4000Hz 6000Hz 8000Hz  Right ear:           Left ear:           Comments: Pt denies any difficulty hearing at this time   Vision Screening Comments: Follows up for eye exams annually with Dr Standley Dakins  Dietary issues and exercise activities discussed: Current Exercise Habits: Home exercise routine (yard work), Exercise limited by: orthopedic condition(s)  Goals    . patient     Keep doing what you doing now; staying active      . Patient Stated     Maintain your health !     . Patient Stated     Stay healthy    . Quit Smoking     Now smokes 1/2 pack from a pack a day  Smoking;  Educated to avoid secondary smoke   Meds may help; chatix (Varenicline); Zyban (Bupropion SR); Nicotine Replacement (gum; lozenges; patches; etc.)         Depression Screen PHQ 2/9 Scores 03/30/2020 09/29/2019 05/30/2019 04/29/2019 03/21/2019 03/16/2019 12/27/2018  PHQ - 2 Score 0 0 0 0 0 0 0  PHQ- 9 Score - 1 0 2 0 - 0    Fall Risk Fall Risk  03/30/2020 05/30/2019 04/29/2019 03/21/2019 03/16/2019  Falls in the past year? 0 0 0 0 0  Number falls in past yr: 0 0 0 0 0  Injury with Fall? 0 0 0 0 0  Risk for fall due to : Impaired balance/gait;Impaired mobility - - - -  Follow up Falls prevention discussed - - - Education provided;Falls prevention discussed;Falls evaluation completed    Any stairs in or around the home? Yes  If so, are there any without handrails? No  Home free of loose throw rugs in walkways, pet beds, electrical cords, etc? Yes  Adequate lighting in your home to reduce risk of falls? Yes   ASSISTIVE DEVICES UTILIZED TO PREVENT FALLS:  Life alert? No  Use of a cane, walker or w/c? No  Grab bars in the bathroom? Yes  Shower chair or bench in shower? No  Elevated toilet seat or a handicapped toilet? Yes  TIMED UP AND GO:  Was the test performed? No .   Cognitive Function: MMSE - Mini Mental State Exam 12/03/2017  Not completed: (No Data)      6CIT Screen 03/30/2020 03/16/2019 10/28/2016  What Year? 0 points 0 points 0 points  What month? 0 points 0 points 0 points  What time? - 0 points 0 points  Count back from 20 0 points 0 points 0 points  Months in reverse 4 points 0 points 0 points  Repeat phrase 0 points 2 points 6 points  Total Score - 2 6    Immunizations Immunization History  Administered Date(s) Administered  . Fluad Quad(high Dose 65+) 02/10/2019, 03/01/2020  . Influenza, High Dose Seasonal PF 05/18/2015, 03/18/2016, 03/26/2017, 06/04/2018  . Influenza-Unspecified 05/18/2015, 03/18/2016, 04/30/2016  . Moderna SARS-COVID-2 Vaccination 07/07/2019, 08/04/2019  . Pneumococcal Conjugate-13 01/31/2014  . Pneumococcal Polysaccharide-23 12/03/2017  . Tdap 03/21/2019  . Zoster 07/06/2013    TDAP status: Up to date Flu Vaccine status: Up to date Pneumococcal vaccine status: Up to date Covid-19 vaccine status: Completed vaccines  Qualifies for Shingles Vaccine? Yes   Zostavax completed Yes   Shingrix Completed?: No.    Education has been provided regarding the importance of this vaccine. Patient has been advised to call insurance company to determine out of pocket expense if they have not yet received this vaccine. Advised may also receive vaccine at local pharmacy or Health Dept. Verbalized acceptance and understanding.  Screening Tests Health Maintenance  Topic Date Due  . Hepatitis C Screening  Never done  . TETANUS/TDAP  03/20/2029  . INFLUENZA VACCINE  Completed  . COVID-19 Vaccine  Completed  . PNA vac Low Risk Adult  Completed    Health Maintenance  Health Maintenance Due  Topic Date Due  . Hepatitis C Screening  Never done    Colorectal cancer screening: No longer required.   Lung Cancer Screening: (Low Dose CT Chest recommended if Age 80-80 years, 30 pack-year currently smoking OR have quit w/in 15years.)   Additional Screening:  Hepatitis C Screening: does qualify  Vision Screening:  Recommended annual ophthalmology exams for early detection of glaucoma and other disorders of the eye. Is the patient up to date with their annual eye exam?  Yes  Who is the provider or what is the name of the office in which the patient attends annual eye exams? Dr Gillie Manners   Dental Screening: Recommended annual dental exams for proper oral hygiene  Community Resource Referral / Chronic Care Management: CRR required this visit?  No   CCM required this visit?  No      Plan:     I have personally reviewed and noted the following in the patient's chart:   . Medical and social history . Use of alcohol, tobacco or illicit drugs  . Current medications and supplements . Functional ability and status . Nutritional status . Physical activity . Advanced directives . List of other physicians . Hospitalizations, surgeries, and ER visits in previous 12 months . Vitals . Screenings to include cognitive, depression, and falls . Referrals and appointments  In addition, I have reviewed and discussed with patient certain preventive protocols, quality metrics, and best practice recommendations. A written personalized care plan for preventive services as well as general preventive health recommendations were provided to patient.     Marzella Schlein,  LPN   [id number]   Nurse Notes: None

## 2020-03-31 DIAGNOSIS — M545 Low back pain, unspecified: Secondary | ICD-10-CM | POA: Diagnosis not present

## 2020-04-03 NOTE — Patient Instructions (Addendum)
Please stop by lab before you go If you have mychart- we will send your results within 3 business days of [place] receiving them.  If you do not have mychart- we will call you about results within 5 business days of [place] receiving them.  *please note we are currently using [facility] which has a longer processing time than [place] typically so labs may not come back as quickly as in the past *please also note that you will see labs on mychart as soon as they post. I will later go in and write notes on them- will say "notes from Dr. Durene Cal"  Please check with your pharmacy or the [facility] to see if they have the shingrix vaccine. If they do- please get this immunization and update [place] by phone call or mychart with dates you receive the vaccine

## 2020-04-03 NOTE — Progress Notes (Signed)
Phone: [phone]   Subjective:  Patient presents today for their annual physical. Chief complaint-noted.   See problem oriented charting- ROS- full  review of systems was completed and negative  except for: urinary frequency, joint pain, back pain, nocturia perhaps 3-4 x a night stable over last few years  The following were reviewed and entered/updated in epic: Past Medical History:  Diagnosis Date  . Anxiety    never tried on SSRI. xanax 0.65m BID through Dr. BTollie Pizza   . DDD (degenerative disc disease), lumbar    lumbar DDD- follows with orthopedist Dr. NLinus Ornortho. mobic 157mdaily and hydrocodone 5/325 prn  . Family history of breast cancer   . Macular degeneration 07/21/2016   Follows with optho- areds2. He states he is unsure of diagnosis.   . Neuropathy    unknown cause ? back related(sees ortho for back). burning sensation in feet. gabapentin 10069mfter dinner, 2 before bed  . Smoker    8-10 cigarettes a day. plus nicorette. 60 years. over 50 pack years  . Venous stasis     in the past- Wound center on legs. Elevate. Compression stockings.    Patient Active Problem List   Diagnosis Date Noted  . GAD (generalized anxiety disorder)     Priority: High  . Cigarette smoker     Priority: High  . OSA on CPAP 12/03/2017    Priority: Medium  . Hyperlipidemia 01/28/2017    Priority: Medium  . Pulmonary nodules 12/26/2016    Priority: Medium  . Aortic atherosclerosis (HCCTrappe5/15/2018    Priority: Medium  . Neuropathy     Priority: Medium  . DDD (degenerative disc disease), lumbar     Priority: Medium  . Genetic testing 10/14/2016    Priority: Low  . Family history of colon cancer 10/02/2016    Priority: Low  . Family history of breast cancer     Priority: Low  . Hilar lymphadenopathy 09/23/2016    Priority: Low  . Lymph nodes enlarged 09/23/2016    Priority: Low  . Family history of genetic disease 08/28/2016    Priority: Low  . Macular degeneration  07/21/2016    Priority: Low  . Degenerative lumbar spinal stenosis 04/06/2019  . Jock itch 07/31/2017   Past Surgical History:  Procedure Laterality Date  . ESOPHAGOGASTRODUODENOSCOPY Left 05/28/2016    Surgeon: JyoJuanita CraverD; food stuck in esophagus    Family History  Problem Relation Age of Onset  . Other Mother        "old age" per patient   . Dementia Mother   . Alcohol abuse Father        contributed  . Cancer Sister        unknown type  . Alcohol abuse Brother   . Breast cancer Sister        dx in her early 30s33s Other Sister        PALB2+  . Breast cancer Maternal Aunt   . Colon cancer Maternal Uncle   . Breast cancer Other 39    Medications- reviewed and updated Current Outpatient Medications  Medication Sig Dispense Refill  . ALPRAZolam (XANAX) 0.5 MG tablet TAKE 1 TABLET BY MOUTH TWICE A DAY AS NEEDED FOR ANXIETY 37 tablet 4  . aspirin EC 81 MG tablet Take 81 mg by mouth daily.    . aMarland Kitchenorvastatin (LIPITOR) 20 MG tablet TAKE 1 TABLET BY MOUTH EVERY DAY 90 tablet 3  . escitalopram (LEXAPRO) 5 MG  tablet Take 1 tablet (5 mg total) by mouth daily. 90 tablet 3  . gabapentin (NEURONTIN) 400 MG capsule 1 capsule midday and 2 capsules at bedtime 270 capsule 5  . ketoconazole (NIZORAL) 2 % cream Apply 1 application topically daily. For jock itch up to 2 weeks 60 g 2  . meloxicam (MOBIC) 15 MG tablet TAKE 1 TABLET BY MOUTH EVERY DAY WITH EVENING MEAL 90 tablet 3  . Multiple Vitamins-Minerals (PRESERVISION AREDS 2 PO) Take 1 tablet by mouth daily.     No current facility-administered medications for this visit.    Allergies-reviewed and updated Allergies  Allergen Reactions  . Penicillins Rash    Has patient had a PCN reaction causing immediate rash, facial/tongue/throat swelling, SOB or lightheadedness with hypotension: no Has patient had a PCN reaction causing severe rash involving mucus membranes or skin necrosis:yes Has patient had a PCN reaction that required  hospitalization: no Has patient had a PCN reaction occurring within the last 10 years: no If all of the above answers are "NO", then may proceed with Cephalosporin use.     Social History   Social History Narrative   Married around 1964. 3 children. 6 grandkids.       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 Oropharynx normal. TM normal Neck: no thyromegaly CV: RRR no murmurs rubs or gallops Lungs: CTAB no crackles, wheeze, rhonchi Abdomen: soft/nontender/nondistended/normal bowel sounds. No rebound or guarding. Ext: no edema Skin: warm, dry Neuro: grossly normal, moves all extremities, PERRLA Wears back brace   Assessment and Plan  79 y.o. male presenting for annual physical.  Health Maintenance counseling: 1. Anticipatory guidance: Patient counseled regarding regular dental exams q6 months, eye exams yearly,  avoiding smoking and second hand smoke- see below about smoking section, limiting alcohol to 2 beverages per day- no alcohol within 6 hours of Xanax advised as well. 3 per week is reasonable.   2. Risk factor reduction:  Advised patient of need for regular exercise and diet rich and fruits and vegetables to reduce risk of heart attack and stroke. Exercise- recommend 150 minutes a week of exercise as long as no chest pain or shortness of breath-he is trying to remain active and continues to work but no longer going on ladders around her house. Diet-recommended heart healthy diet- he feels like doesn't eat the best food choices Wt Readings from Last 3 Encounters:  04/05/20 215 lb 12.8 oz (97.9 kg)  03/01/20 213 lb 9.6 oz (96.9 kg)  02/08/20 212 lb 3.2 oz (96.3 kg)  3.  Immunizations/screenings/ancillary studies-already had flu shot.  Discussed FDA approved booster for moderna but pending CDC evaluation- he will do through [place] and let us know.  Recommended one-time lifetime hepatitis C screening. discussed Shingrix at pharmacy Immunization History  Administered Date(s) Administered  . Fluad Quad(high Dose 65+) 02/10/2019, 03/01/2020  . Influenza, High Dose Seasonal PF 05/18/2015, 03/18/2016, 03/26/2017, 06/04/2018  . Influenza-Unspecified 05/18/2015, 03/18/2016, 04/30/2016  . Moderna SARS-COVID-2 Vaccination 07/07/2019, 08/04/2019  . Pneumococcal Conjugate-13 01/31/2014  . Pneumococcal Polysaccharide-23 12/03/2017  . Tdap 03/21/2019  . Zoster 07/06/2013   4. Prostate cancer  screening-past age based screening recommendations  5. Colon cancer screening - patient follows with Dr. Jeb Levering has told him follow-up in 2022 6. Skin cancer screening-no dermatologist. advised regular sunscreen use. Denies worrisome, changing, or new skin lesions.  7. Current smoker-smoking 8 to 10 cigarettes a day still-over 50 pack years.  Declined cessation.  Enrolled in lung cancer screening-then Dr. Elsworth Soho saw him 2020- patient states was told would have repeat in 2022 8. STD screening - monogamous so declines  Status of chronic or acute concerns   # GAD S: compliant with lexapro 19m. Previously had dry mouth on zoloft 15109mand cut to 5036mnd still had issues and no longer on medication-has done better on Lexapro 5 mg. Using alprazolam #37 per month as well. no falls.  A/P: GAD reasonable control- continue current  - should not drive for 6-8 hours after taking alprazolam- counseled today  # Hyperlipidemia/aortic atherosclerosis/CAD on CT scan S:compliant with atorvastatin 19m32mspirin 81mg71mh smoking history we have opted to continue aspirin as well. Aortic atherosclerosis on prior imaging as well- reasonable for statin and aspirin Lab Results  Component Value Date   CHOL  119 09/29/2019   HDL 35.00 (L) 09/29/2019   LDLCALC 58 09/29/2019   LDLDIRECT 56.0 04/29/2019   TRIG 126.0 09/29/2019   CHOLHDL 3 09/29/2019   A/P: LDL at goal on last check-check just direct LDL today  # Neuropathy  S:continued issues with left foot related to back. Uses gabapentin 400 at lunch and 800 before bed- finds this helpful  A/P:  Reasonable control- continue current meds   %-Degenerative disc disease lumbar spine- follows with Dr. NitkaDeon Pillingn Mobic and hydrocodone (in past). - Discussion with patient increase bleeding risk on meloxicam consider reduce dosing if possible  -Has seen sports medicine-diagnosed as degenerative spinal stenosis -MRI done April 18, 2019 and plan  for epidural- he had this and has noted improvement - has another MRI ordered it appears and follow up with Dr. NitkaLouanne Skyedid better for a few weeks on prednisone then symptoms worsened. He would prefer to avoid interaction.  -with long term meloxicam- we will closely monitor kidney function- may have to stop if worsening- also discussed increased heart attack risk with meloxicam  Recommended follow up: Return in about 6 months (around 10/04/2020) for follow up- or sooner if needed. Future Appointments  Date Time Provider Depar[place]ell27/2021  9:30 AM [facility]itkaJessy [facility] None  04/05/2021  3:15 PM [facility]   Lab/Order associations:Non fasting- 8 oz of coke and apple pie [employer]. Of note sugar normal last visit when fasting os adequately screened for diabetes already   ICD-10-CM   1. Preventative health care  Z00.00   2. GAD (generalized anxiety disorder)  F41.1   3. Hyperlipidemia, unspecified hyperlipidemia type  E78.5   4. Encounter for hepatitis C screening test for low risk patient  Z11.59 Hepatitis C antibody     Return precautions advised.  StephGarret Reddish

## 2020-04-05 ENCOUNTER — Encounter: Payer: Self-pay | Admitting: Family Medicine

## 2020-04-05 ENCOUNTER — Ambulatory Visit (INDEPENDENT_AMBULATORY_CARE_PROVIDER_SITE_OTHER): Payer: Medicare Other | Admitting: Family Medicine

## 2020-04-05 ENCOUNTER — Other Ambulatory Visit: Payer: Self-pay

## 2020-04-05 VITALS — BP 118/70 | HR 68 | Temp 97.7°F | Resp 18 | Ht 70.0 in | Wt 215.8 lb

## 2020-04-05 DIAGNOSIS — Z1159 Encounter for screening for other viral diseases: Secondary | ICD-10-CM | POA: Diagnosis not present

## 2020-04-05 DIAGNOSIS — E785 Hyperlipidemia, unspecified: Secondary | ICD-10-CM

## 2020-04-05 DIAGNOSIS — Z0000 Encounter for general adult medical examination without abnormal findings: Secondary | ICD-10-CM

## 2020-04-05 DIAGNOSIS — F411 Generalized anxiety disorder: Secondary | ICD-10-CM

## 2020-04-05 LAB — POC URINALSYSI DIPSTICK (AUTOMATED)
Bilirubin, UA: NEGATIVE
Blood, UA: NEGATIVE
Glucose, UA: NEGATIVE
Ketones, UA: NEGATIVE
Leukocytes, UA: NEGATIVE
Nitrite, UA: NEGATIVE
Protein, UA: NEGATIVE
Spec Grav, UA: 1.02 (ref 1.010–1.025)
Urobilinogen, UA: 0.2 E.U./dL
pH, UA: 6 (ref 5.0–8.0)

## 2020-04-05 NOTE — Addendum Note (Signed)
Addended by: Manuela Schwartz on: 04/05/2020 09:50 AM   Modules accepted: Orders

## 2020-04-09 ENCOUNTER — Telehealth: Payer: Self-pay

## 2020-04-09 LAB — CBC WITH DIFFERENTIAL/PLATELET
Absolute Monocytes: 445 cells/uL (ref 200–950)
Basophils Absolute: 39 cells/uL (ref 0–200)
Basophils Relative: 1 %
Eosinophils Absolute: 367 cells/uL (ref 15–500)
Eosinophils Relative: 9.4 %
HCT: 38.7 % (ref 38.5–50.0)
Hemoglobin: 13.1 g/dL — ABNORMAL LOW (ref 13.2–17.1)
Lymphs Abs: 1174 cells/uL (ref 850–3900)
MCH: 32.6 pg (ref 27.0–33.0)
MCHC: 33.9 g/dL (ref 32.0–36.0)
MCV: 96.3 fL (ref 80.0–100.0)
MPV: 10 fL (ref 7.5–12.5)
Monocytes Relative: 11.4 %
Neutro Abs: 1876 cells/uL (ref 1500–7800)
Neutrophils Relative %: 48.1 %
Platelets: 227 10*3/uL (ref 140–400)
RBC: 4.02 10*6/uL — ABNORMAL LOW (ref 4.20–5.80)
RDW: 12.7 % (ref 11.0–15.0)
Total Lymphocyte: 30.1 %
WBC: 3.9 10*3/uL (ref 3.8–10.8)

## 2020-04-09 LAB — COMPLETE METABOLIC PANEL WITH GFR
AG Ratio: 1.4 (calc) (ref 1.0–2.5)
ALT: 12 U/L (ref 9–46)
AST: 15 U/L (ref 10–35)
Albumin: 3.7 g/dL (ref 3.6–5.1)
Alkaline phosphatase (APISO): 74 U/L (ref 35–144)
BUN: 12 mg/dL (ref 7–25)
CO2: 29 mmol/L (ref 20–32)
Calcium: 9.1 mg/dL (ref 8.6–10.3)
Chloride: 107 mmol/L (ref 98–110)
Creat: 1.11 mg/dL (ref 0.70–1.18)
GFR, Est African American: 73 mL/min/{1.73_m2} (ref >=60)
GFR, Est Non African American: 63 mL/min/{1.73_m2} (ref >=60)
Globulin: 2.7 g/dL (calc) (ref 1.9–3.7)
Glucose, Bld: 122 mg/dL — ABNORMAL HIGH (ref 65–99)
Potassium: 3.8 mmol/L (ref 3.5–5.3)
Sodium: 140 mmol/L (ref 135–146)
Total Bilirubin: 0.6 mg/dL (ref 0.2–1.2)
Total Protein: 6.4 g/dL (ref 6.1–8.1)

## 2020-04-09 LAB — HEPATITIS C ANTIBODY
Hepatitis C Ab: NONREACTIVE
SIGNAL TO CUT-OFF: 0.01 (ref <1.00)

## 2020-04-09 LAB — LDL CHOLESTEROL, DIRECT: Direct LDL: 49 mg/dL (ref <100)

## 2020-04-09 NOTE — Telephone Encounter (Signed)
patient is returning call regarding lab results

## 2020-04-09 NOTE — Telephone Encounter (Signed)
Patient is returning a call about lab results.  

## 2020-04-09 NOTE — Telephone Encounter (Signed)
Called and lm for pt tcb. 

## 2020-04-09 NOTE — Telephone Encounter (Signed)
Returned pt call and labs reviewed.  

## 2020-04-11 ENCOUNTER — Ambulatory Visit: Payer: Medicare Other | Admitting: Specialist

## 2020-04-11 ENCOUNTER — Encounter: Payer: Self-pay | Admitting: Specialist

## 2020-04-11 VITALS — BP 117/77 | HR 72 | Ht 70.0 in | Wt 215.8 lb

## 2020-04-11 DIAGNOSIS — M4726 Other spondylosis with radiculopathy, lumbar region: Secondary | ICD-10-CM

## 2020-04-11 DIAGNOSIS — M5442 Lumbago with sciatica, left side: Secondary | ICD-10-CM | POA: Diagnosis not present

## 2020-04-11 DIAGNOSIS — M48062 Spinal stenosis, lumbar region with neurogenic claudication: Secondary | ICD-10-CM

## 2020-04-11 DIAGNOSIS — M7138 Other bursal cyst, other site: Secondary | ICD-10-CM | POA: Diagnosis not present

## 2020-04-11 MED ORDER — METHYLPREDNISOLONE 4 MG PO TBPK: ORAL_TABLET | ORAL | 0 refills | Status: DC

## 2020-04-11 NOTE — Addendum Note (Signed)
Addended by: Vira Browns on: 04/11/2020 12:17 PM   Modules accepted: Orders

## 2020-04-16 ENCOUNTER — Telehealth: Payer: Self-pay

## 2020-04-16 NOTE — Telephone Encounter (Signed)
Called pt back and sch 11/30.

## 2020-04-16 NOTE — Telephone Encounter (Signed)
Patient called back returning call.  

## 2020-05-15 ENCOUNTER — Encounter: Payer: Self-pay | Admitting: Physical Medicine and Rehabilitation

## 2020-05-15 ENCOUNTER — Ambulatory Visit: Payer: Self-pay

## 2020-05-15 ENCOUNTER — Ambulatory Visit (INDEPENDENT_AMBULATORY_CARE_PROVIDER_SITE_OTHER): Payer: Medicare Other | Admitting: Physical Medicine and Rehabilitation

## 2020-05-15 ENCOUNTER — Other Ambulatory Visit: Payer: Self-pay

## 2020-05-15 VITALS — BP 113/74 | HR 68

## 2020-05-15 DIAGNOSIS — M7138 Other bursal cyst, other site: Secondary | ICD-10-CM | POA: Diagnosis not present

## 2020-05-15 DIAGNOSIS — M47816 Spondylosis without myelopathy or radiculopathy, lumbar region: Secondary | ICD-10-CM | POA: Diagnosis not present

## 2020-05-15 MED ORDER — METHYLPREDNISOLONE ACETATE 80 MG/ML IJ SUSP
80.0000 mg | Freq: Once | INTRAMUSCULAR | Status: AC
  Administered 2020-05-15: 80 mg

## 2020-05-15 NOTE — Procedures (Signed)
Lumbar Facet Joint Intra-Articular Injection(s) with Fluoroscopic Guidance  Patient: Jonathon [name]      Date of Birth: [birth year] MRN: [id number] PCP: Shelva Majestic, MD      Visit Date: 05/15/2020   Universal Protocol:    Date/Time: 05/15/2020  Consent Given By: the patient  Position: PRONE   Additional Comments: Vital signs were monitored before and after the procedure. Patient was prepped and draped in the usual sterile fashion. The correct patient, procedure, and site was verified.   Injection Procedure Details:  Procedure Site One Meds Administered:  Meds ordered this encounter  Medications  . methylPREDNISolone acetate (DEPO-MEDROL) injection 80 mg     Laterality: Left  Location/Site:  L4-L5  Needle size: 22 guage  Needle type: Spinal  Needle Placement: Articular  Findings:  -Comments: Excellent flow of contrast producing a partial arthrogram.  Procedure Details: The fluoroscope beam is vertically oriented in AP, and the inferior recess is visualized beneath the lower pole of the inferior apophyseal process, which represents the target point for needle insertion. When direct visualization is difficult the target point is located at the medial projection of the vertebral pedicle. The region overlying each aforementioned target is locally anesthetized with a 1 to 2 ml. volume of 1% Lidocaine without Epinephrine.   The spinal needle was inserted into each of the above mentioned facet joints using biplanar fluoroscopic guidance. A 0.25 to 0.5 ml. volume of Isovue-250 was injected and a partial facet joint arthrogram was obtained. A single spot film was obtained of the resulting arthrogram.    One to 1.25 ml of the steroid/anesthetic solution was then injected into each of the facet joints noted above.   Additional Comments:  The patient tolerated the procedure well Dressing: 2 x 2 sterile gauze and Band-Aid    Post-procedure details: Patient was observed  during the procedure. Post-procedure instructions were reviewed.  Patient left the clinic in stable condition.

## 2020-05-15 NOTE — Progress Notes (Signed)
Jonathon [name] - 79 y.o. male MRN [id number]  Date of birth: 1940/07/29  Office Visit Note: Visit Date: 05/15/2020 PCP: Shelva Majestic, MD Referred by: Shelva Majestic, MD  Subjective: Chief Complaint  Patient presents with  . Lower Back - Pain  . Left Leg - Pain   HPI:  Jonathon [name] is a 79 y.o. male who comes in today at the request of Dr. Vira Browns for planned Left  L4-L5 Lumbar facet/medial branch block with fluoroscopic guidance.  The patient has failed conservative care including home exercise, medications, time and activity modification.  This injection will be diagnostic and hopefully therapeutic.  Please see requesting physician notes for further details and justification.  Exam has shown concordant pain with facet joint loading.  Patient specifically has facet joint cyst causing lateral recess narrowing at L4-5 on the left with concordant symptoms.  Will attempt to aspirate and inject the facet joint intra-articularly.  MRI reviewed with images and spine model.  MRI reviewed in the note below.    ROS Otherwise per HPI.  Assessment & Plan: Visit Diagnoses:    ICD-10-CM   1. Spondylosis without myelopathy or radiculopathy, lumbar region  M47.816 XR C-ARM NO REPORT    Facet Injection    methylPREDNISolone acetate (DEPO-MEDROL) injection 80 mg  2. Synovial cyst of lumbar facet joint  M71.38 XR C-ARM NO REPORT    Facet Injection    methylPREDNISolone acetate (DEPO-MEDROL) injection 80 mg    Plan: No additional findings.   Meds & Orders:  Meds ordered this encounter  Medications  . methylPREDNISolone acetate (DEPO-MEDROL) injection 80 mg    Orders Placed This Encounter  Procedures  . Facet Injection  . XR C-ARM NO REPORT    Follow-up: Return for Vira Browns, MD.   Procedures: No procedures performed  Lumbar Facet Joint Intra-Articular Injection(s) with Fluoroscopic Guidance  Patient: Jonathon [name]      Date of Birth: Aug 12, 1940 MRN: [id number] PCP:  Shelva Majestic, MD      Visit Date: 05/15/2020   Universal Protocol:    Date/Time: 05/15/2020  Consent Given By: the patient  Position: PRONE   Additional Comments: Vital signs were monitored before and after the procedure. Patient was prepped and draped in the usual sterile fashion. The correct patient, procedure, and site was verified.   Injection Procedure Details:  Procedure Site One Meds Administered:  Meds ordered this encounter  Medications  . methylPREDNISolone acetate (DEPO-MEDROL) injection 80 mg     Laterality: Left  Location/Site:  L4-L5  Needle size: 22 guage  Needle type: Spinal  Needle Placement: Articular  Findings:  -Comments: Excellent flow of contrast producing a partial arthrogram.  Procedure Details: The fluoroscope beam is vertically oriented in AP, and the inferior recess is visualized beneath the lower pole of the inferior apophyseal process, which represents the target point for needle insertion. When direct visualization is difficult the target point is located at the medial projection of the vertebral pedicle. The region overlying each aforementioned target is locally anesthetized with a 1 to 2 ml. volume of 1% Lidocaine without Epinephrine.   The spinal needle was inserted into each of the above mentioned facet joints using biplanar fluoroscopic guidance. A 0.25 to 0.5 ml. volume of Isovue-250 was injected and a partial facet joint arthrogram was obtained. A single spot film was obtained of the resulting arthrogram.    One to 1.25 ml of the steroid/anesthetic solution was then injected into each of  the facet joints noted above.   Additional Comments:  The patient tolerated the procedure well Dressing: 2 x 2 sterile gauze and Band-Aid    Post-procedure details: Patient was observed during the procedure. Post-procedure instructions were reviewed.  Patient left the clinic in stable condition.      Clinical History: MRI LUMBAR  SPINE WITHOUT CONTRAST  TECHNIQUE: Multiplanar, multisequence MR imaging of the lumbar spine was performed. No intravenous contrast was administered.  COMPARISON: 04/18/2019 MRI lumbar spine and prior.  FINDINGS: Segmentation: Transitional lumbosacral anatomy with S1 lumbarization. Lowest well-formed intervertebral disc space is the S1-2 level.  Alignment: Minimal L4-5 grade 1 anterolisthesis. Slight levocurvature.  Vertebrae: Multilevel Modic type 2 endplate degenerative changes most prominent at the L3-4 level. Scattered T1/T2 hyperintense foci reflect hemangiomata versus focal fat. No fracture..  Conus medullaris and cauda equina: Conus extends to the L2 level. Conus and cauda equina appear normal.  Disc levels: Multilevel desiccation, osteophytosis and disc space loss. Multilevel Schmorl's node formation.  L1-2: Disc bulge and bilateral facet hypertrophy. Mild spinal canal narrowing. Patent neural foramen.  L2-3: Disc bulge and bilateral facet hypertrophy. Patent spinal canal and left neural foramen. Mild right neural foraminal narrowing.  L3-4: Disc bulge, ligamentum flavum and bilateral facet hypertrophy. Mild spinal canal, moderate right and mild left neural foraminal narrowing.  L4-5: Disc bulge with right paracentral prominence. Left subarticular annular fissuring abutting the descending left L5 nerve root. Anteriorly oriented left facet 5 mm synovial cyst dorsally abutting the descending left L5 nerve root (6:26). Bilateral facet and ligamentum flavum hypertrophy. Severe spinal canal narrowing, increased since prior exam. Mild bilateral neural foraminal narrowing.  L5-S1: Disc bulge with superimposed central protrusion abutting the descending S1 nerve roots, ligamentum flavum and bilateral facet hypertrophy. Moderate spinal canal, moderate right and severe left neural foraminal narrowing.  S1-2: Mild disc bulge and bilateral facet hypertrophy. Abutment  of the left greater than right descending S2 nerve roots. Patent spinal canal. Mild bilateral neural foraminal narrowing.  Paraspinal and other soft tissues: Negative.  IMPRESSION: Multilevel spondylosis and S1 lumbarization.  Severe L4-5 and moderate L5-S1 spinal canal narrowing.  Descending left L5 nerve root is interposed between left subarticular annular fissuring and anteriorly projecting 5 mm synovial cyst.  Moderate right L3-4 and moderate to severe bilateral L5-S1 neural foraminal narrowing.   Electronically Signed By: Stana Bunting M.D. On: 03/31/2020 09:03     Objective:  VS:  HT:    WT:   BMI:     BP:113/74  HR:68bpm  TEMP: ( )  RESP:  Physical Exam Constitutional:      General: He is not in acute distress.    Appearance: Normal appearance. He is not ill-appearing.  HENT:     Head: Normocephalic and atraumatic.     Right Ear: External ear normal.     Left Ear: External ear normal.  Eyes:     Extraocular Movements: Extraocular movements intact.  Cardiovascular:     Rate and Rhythm: Normal rate.     Pulses: Normal pulses.  Abdominal:     General: There is no distension.     Palpations: Abdomen is soft.  Musculoskeletal:        General: No tenderness or signs of injury.     Right lower leg: No edema.     Left lower leg: No edema.     Comments: Patient has good distal strength without clonus.  Skin:    Findings: No erythema or rash.  Neurological:  General: No focal deficit present.     Mental Status: He is alert and oriented to person, place, and time.     Sensory: No sensory deficit.     Motor: No weakness or abnormal muscle tone.     Coordination: Coordination normal.  Psychiatric:        Mood and Affect: Mood normal.        Behavior: Behavior normal.      Imaging: No results found.

## 2020-05-15 NOTE — Progress Notes (Signed)
Pt state lower back pain that travels to his positier leg thigh and leg. Pt state walking for a long period of time makes the pain worse. Pt state he takes pain meds to help ease the pain.  Numeric Pain Rating Scale and Functional Assessment Average Pain 7   In the last MONTH (on 0-10 scale) has pain interfered with the following?  1. General activity like being  able to carry out your everyday physical activities such as walking, climbing stairs, carrying groceries, or moving a chair?  Rating(9)   +Driver, -BT, -Dye Allergies.

## 2020-05-18 ENCOUNTER — Telehealth (INDEPENDENT_AMBULATORY_CARE_PROVIDER_SITE_OTHER): Payer: Medicare Other | Admitting: Family Medicine

## 2020-05-18 VITALS — Ht 70.0 in | Wt 213.0 lb

## 2020-05-18 DIAGNOSIS — R059 Cough, unspecified: Secondary | ICD-10-CM

## 2020-05-18 MED ORDER — PREDNISONE 50 MG PO TABS: ORAL_TABLET | ORAL | 0 refills | Status: DC

## 2020-05-18 MED ORDER — AZITHROMYCIN 250 MG PO TABS: ORAL_TABLET | ORAL | 0 refills | Status: DC

## 2020-05-18 NOTE — Progress Notes (Signed)
   Jonathon [name] is a 79 y.o. male who presents today for a telephone visit.  Assessment/Plan:  Cough / Congestion We will treat as COPD flare given his smoking history.  No red flags.  Start azithromycin and prednisone.  Has had 2 Covid vaccines.  He is about 10 days into his course -could test for COVID but would not significantly change management at this point.  Discussed reasons to return to care.  Follow-up as needed.   Subjective:  HPI:  Patient with chest congestion and cough for about a week and a half. No fevers or chills. Feels similar to prior episodes of pneumonia. Breathing is normal. Has been taking Robitussin which helps.  No known sick contacts.        Objective/Observations   NAD  Telephone Visit   I connected with Jonathon [name] on 05/18/20 at  4:00 PM EST via telephone and verified that I am speaking with the correct person using two identifiers. I discussed the limitations of evaluation and management by telemedicine and the availability of in person appointments. The patient expressed understanding and agreed to proceed.   Patient location: Home Provider location: [place] [facility] [employer] Persons participating in the virtual visit: Myself and Patient .      Katina Degree. Jimmey Ralph, MD 05/18/2020 2:15 PM

## 2020-06-22 DIAGNOSIS — H35373 Puckering of macula, bilateral: Secondary | ICD-10-CM | POA: Diagnosis not present

## 2020-06-22 DIAGNOSIS — H2513 Age-related nuclear cataract, bilateral: Secondary | ICD-10-CM | POA: Diagnosis not present

## 2020-06-22 DIAGNOSIS — H40033 Anatomical narrow angle, bilateral: Secondary | ICD-10-CM | POA: Diagnosis not present

## 2020-06-22 DIAGNOSIS — H353132 Nonexudative age-related macular degeneration, bilateral, intermediate dry stage: Secondary | ICD-10-CM | POA: Diagnosis not present

## 2020-07-04 ENCOUNTER — Ambulatory Visit: Payer: Medicare Other | Admitting: Specialist

## 2020-07-04 ENCOUNTER — Encounter: Payer: Self-pay | Admitting: Specialist

## 2020-07-04 VITALS — BP 100/60 | HR 72 | Ht 70.0 in | Wt 213.0 lb

## 2020-07-04 DIAGNOSIS — R29898 Other symptoms and signs involving the musculoskeletal system: Secondary | ICD-10-CM | POA: Diagnosis not present

## 2020-07-04 DIAGNOSIS — M4316 Spondylolisthesis, lumbar region: Secondary | ICD-10-CM

## 2020-07-04 DIAGNOSIS — M7138 Other bursal cyst, other site: Secondary | ICD-10-CM | POA: Diagnosis not present

## 2020-07-04 DIAGNOSIS — M48062 Spinal stenosis, lumbar region with neurogenic claudication: Secondary | ICD-10-CM

## 2020-07-04 MED ORDER — METHYLPREDNISOLONE 4 MG PO TBPK: ORAL_TABLET | ORAL | 0 refills | Status: DC

## 2020-07-04 NOTE — Progress Notes (Signed)
Office Visit Note   Patient: Jonathon [name]           Date of Birth: Aug 10, 1940           MRN: [id number] Visit Date: 07/04/2020              Requested by: Marin Olp, MD [place],  [place] [address] PCP: Marin Olp, MD   Assessment & Plan: Visit Diagnoses:  1. Spinal stenosis of lumbar region with neurogenic claudication   2. Synovial cyst of lumbar facet joint   3. Weakness of left leg   4. Spondylolisthesis, lumbar region     Plan: Avoid bending, stooping and avoid lifting weights greater than 10 lbs. Avoid prolong standing and walking. Order for a new walker with wheels. Surgery scheduling secretary Kandice Hams, will call you in the next week to schedule for surgery.  Surgery recommended is a one level lumbar fusion L4-5 this would be done with rods, screws and cages with local bone graft and allograft (donor bone graft). Take hydrocodone for for pain. Risk of surgery includes risk of infection 1 in 300 patients, bleeding 1/2% chance you would need a transfusion.   Risk to the nerves is one in 10,000. You will need to use a brace for 3 months and wean from the brace on the 4th month. Expect improved walking and standing tolerance. Expect relief of leg pain but numbness may persist depending on the length and degree of pressure that has been present.    Follow-Up Instructions: Return in about 4 weeks (around 08/01/2020).   Orders:  No orders of the defined types were placed in this encounter.  No orders of the defined types were placed in this encounter.     Procedures: No procedures performed   Clinical Data: No additional findings.   Subjective: No chief complaint on file.   80 year old male with left buttock and down the left leg. He underwent MRI and has had ESI left lumbar by Dr. Ernestina Patches with good relief for about one month ago.  He is more horizontal than upright now due to pain in the left leg and pain with standing  and walking. He is unable to walk more than 100 feet due to back and leg pain.    Review of Systems  Constitutional: Negative.   HENT: Negative.   Eyes: Negative.   Respiratory: Negative.   Cardiovascular: Negative.   Gastrointestinal: Negative.   Endocrine: Negative.   Genitourinary: Negative.   Musculoskeletal: Negative.   Skin: Negative.   Allergic/Immunologic: Negative.   Neurological: Negative.   Hematological: Negative.   Psychiatric/Behavioral: Negative.      Objective: Vital Signs: BP 100/60   Pulse 72   Ht 5' 10"  (1.778 m)   Wt 213 lb (96.6 kg)   BMI 30.56 kg/m   Physical Exam Constitutional:      Appearance: He is well-developed and well-nourished.  HENT:     Head: Normocephalic and atraumatic.  Eyes:     Extraocular Movements: EOM normal.     Pupils: Pupils are equal, round, and reactive to light.  Pulmonary:     Effort: Pulmonary effort is normal.     Breath sounds: Normal breath sounds.  Abdominal:     General: Bowel sounds are normal.     Palpations: Abdomen is soft.  Musculoskeletal:     Cervical back: Normal range of motion and neck supple.     Lumbar back: Negative right  straight leg raise test and negative left straight leg raise test.  Skin:    General: Skin is warm and dry.  Neurological:     Mental Status: He is alert and oriented to person, place, and time.  Psychiatric:        Mood and Affect: Mood and affect normal.        Behavior: Behavior normal.        Thought Content: Thought content normal.        Judgment: Judgment normal.     Back Exam   Tenderness  The patient is experiencing tenderness in the lumbar.  Range of Motion  Extension: abnormal  Flexion: abnormal  Lateral bend right: normal  Lateral bend left: normal  Rotation right: normal  Rotation left: normal   Muscle Strength  Right Quadriceps:  5/5  Left Quadriceps:  5/5  Right Hamstrings:  5/5  Left Hamstrings:  4/5   Tests  Straight leg raise right:  negative Straight leg raise left: negative  Reflexes  Patellar: 0/4 Achilles: 2/4 Babinski's sign: normal   Other  Toe walk: normal Heel walk: normal Sensation: normal Gait: abnormal  Erythema: no back redness Scars: absent  Comments:  Left knee extension weakness, 4/5, left leg foot DF is 5/5.      Specialty Comments:  No specialty comments available.  Imaging: No results found.   PMFS History: Patient Active Problem List   Diagnosis Date Noted  . Degenerative lumbar spinal stenosis 04/06/2019  . OSA on CPAP 12/03/2017  . Jock itch 07/31/2017  . Hyperlipidemia 01/28/2017  . Pulmonary nodules 12/26/2016  . Aortic atherosclerosis ([place]) 10/28/2016  . Genetic testing 10/14/2016  . Family history of colon cancer 10/02/2016  . Family history of breast cancer   . Hilar lymphadenopathy 09/23/2016  . Lymph nodes enlarged 09/23/2016  . Family history of genetic disease 08/28/2016  . Macular degeneration 07/21/2016  . Neuropathy   . DDD (degenerative disc disease), lumbar   . GAD (generalized anxiety disorder)   . Cigarette smoker    Past Medical History:  Diagnosis Date  . Anxiety    never tried on SSRI. xanax 0.10m BID through Dr. BTollie Pizza   . DDD (degenerative disc disease), lumbar    lumbar DDD- follows with orthopedist Dr. NLinus Ornortho. mobic 130mdaily and hydrocodone 5/325 prn  . Family history of breast cancer   . Macular degeneration 07/21/2016   Follows with optho- areds2. He states he is unsure of diagnosis.   . Neuropathy    unknown cause ? back related(sees ortho for back). burning sensation in feet. gabapentin 10015mfter dinner, 2 before bed  . Smoker    8-10 cigarettes a day. plus nicorette. 60 years. over 50 pack years  . Venous stasis     in the past- Wound center on legs. [facility]. Compression stockings.     Family History  Problem Relation Age of Onset  . Other Mother        "old age" per patient   . Dementia Mother   . Alcohol  abuse Father        contributed  . Cancer Sister        unknown type  . Alcohol abuse Brother   . Breast cancer Sister        dx in her early 30s86s Other Sister        PALB2+  . Breast cancer Maternal Aunt   . Colon cancer Maternal Uncle   .  Breast cancer Other 39    Past Surgical History:  Procedure Laterality Date  . ESOPHAGOGASTRODUODENOSCOPY Left 05/28/2016    Surgeon: Juanita Craver, MD; food stuck in esophagus   Social History   Occupational History  . Occupation: semi retired  Tobacco Use  . Smoking status: Current Every Day Smoker    Packs/day: 1.00    Years: 62.00    Pack years: 62.00    Types: Cigarettes    Start date: 22  . Smokeless tobacco: Never Used  . Tobacco comment: 02/10/19- .5 pack   Substance and Sexual Activity  . Alcohol use: Yes    Comment: rare drinking occasionally  . Drug use: No  . Sexual activity: Not on file

## 2020-07-04 NOTE — Addendum Note (Signed)
Addended by: Vira Browns on: 07/04/2020 11:21 AM   Modules accepted: Orders

## 2020-07-04 NOTE — Patient Instructions (Addendum)
Plan: Avoid bending, stooping and avoid lifting weights greater than 10 lbs. Avoid prolong standing and walking. Order for a new walker with wheels. Surgery scheduling secretary Tivis Ringer, will call you in the next week to schedule for surgery.  Surgery recommended is a one level lumbar fusion left  L4-5 this would be done with rods, screws and cages with local bone graft and allograft (donor bone graft). Take hydrocodone for for pain. Risk of surgery includes risk of infection 1 in 300 patients, bleeding 1/2% chance you would need a transfusion.   Risk to the nerves is one in 10,000. You will need to use a brace for 3 months and wean from the brace on the 4th month. Expect improved walking and standing tolerance. Expect relief of leg pain but numbness may persist depending on the length and degree of pressure that has been present. Medrol(steroid) dose pack sent to your pharmacy.

## 2020-07-12 ENCOUNTER — Telehealth: Payer: Self-pay

## 2020-07-12 ENCOUNTER — Other Ambulatory Visit: Payer: Self-pay

## 2020-07-12 DIAGNOSIS — M5432 Sciatica, left side: Secondary | ICD-10-CM

## 2020-07-12 NOTE — Telephone Encounter (Signed)
Referral has been placed. 

## 2020-07-12 NOTE — Telephone Encounter (Signed)
Patient is calling in requesting a referral to a neurologist Dr.Henry Elsner, for the nerve issue in the leg.

## 2020-07-17 ENCOUNTER — Other Ambulatory Visit: Payer: Self-pay | Admitting: Family Medicine

## 2020-08-01 ENCOUNTER — Encounter: Payer: Self-pay | Admitting: Specialist

## 2020-08-01 ENCOUNTER — Ambulatory Visit: Payer: Medicare Other | Admitting: Specialist

## 2020-08-01 VITALS — BP 113/64 | HR 76 | Ht 70.0 in | Wt 213.0 lb

## 2020-08-01 DIAGNOSIS — M4316 Spondylolisthesis, lumbar region: Secondary | ICD-10-CM

## 2020-08-01 DIAGNOSIS — M48062 Spinal stenosis, lumbar region with neurogenic claudication: Secondary | ICD-10-CM

## 2020-08-01 DIAGNOSIS — M7138 Other bursal cyst, other site: Secondary | ICD-10-CM | POA: Diagnosis not present

## 2020-08-01 DIAGNOSIS — M5442 Lumbago with sciatica, left side: Secondary | ICD-10-CM

## 2020-08-01 DIAGNOSIS — M4726 Other spondylosis with radiculopathy, lumbar region: Secondary | ICD-10-CM

## 2020-08-01 DIAGNOSIS — R29898 Other symptoms and signs involving the musculoskeletal system: Secondary | ICD-10-CM

## 2020-08-01 NOTE — Patient Instructions (Addendum)
Plan: Avoid bending, stooping and avoid lifting weights greater than 10 lbs. Avoid prolong standing and walking. Avoid frequent bending and stooping  No lifting greater than 10 lbs. May use ice or moist heat for pain. Weight loss is of benefit. Handicap license is approved. We will hold on scheduling surgery as he is back to normal function for him and does not desire intervention.  Call if the pain recurrs and we will send in a steroid dose pak and schedule appointment at that time.

## 2020-08-01 NOTE — Progress Notes (Signed)
Office Visit Note   Patient: Jonathon [name]           Date of Birth: [birth year]           MRN: [id number] Visit Date: 08/01/2020              Requested by: Marin Olp, MD [place],  [place] [address] PCP: Marin Olp, MD   Assessment & Plan: Visit Diagnoses:  1. Spondylolisthesis, lumbar region   2. Spinal stenosis of lumbar region with neurogenic claudication   3. Synovial cyst of lumbar facet joint   4. Weakness of left leg   5. Other spondylosis with radiculopathy, lumbar region   6. Left-sided low back pain with left-sided sciatica, unspecified chronicity     Plan: Avoid bending, stooping and avoid lifting weights greater than 10 lbs. Avoid prolong standing and walking. Avoid frequent bending and stooping  No lifting greater than 10 lbs. May use ice or moist heat for pain. Weight loss is of benefit. Handicap license is approved. We will hold on scheduling surgery as he is back to normal function for him and does not desire intervention.   Follow-Up Instructions: No follow-ups on file.   Orders:  No orders of the defined types were placed in this encounter.  No orders of the defined types were placed in this encounter.     Procedures: No procedures performed   Clinical Data: No additional findings.   Subjective: Chief Complaint  Patient presents with  . Lower Back - Follow-up    80 year old male with a history of lumbar ddd and spondylolisthesis and spinal stenosis. He had worsening pain and we tried a steroid taper over one month with good relief of pain. He reports that the pain is much better with the oral steroid.  He is helping his brother in law to fix his HVA. He is standing better and walking better. He wants to wait on any surgical intervention.    Review of Systems  Constitutional: Negative.   HENT: Negative.   Eyes: Negative.   Respiratory: Negative.   Cardiovascular: Negative.   Gastrointestinal: Negative.    Endocrine: Negative.   Genitourinary: Negative.   Musculoskeletal: Negative.   Skin: Negative.   Allergic/Immunologic: Negative.   Neurological: Negative.   Hematological: Negative.   Psychiatric/Behavioral: Negative.      Objective: Vital Signs: BP 113/64   Pulse 76   Ht 5' 10"  (1.778 m)   Wt 213 lb (96.6 kg)   BMI 30.56 kg/m   Physical Exam Constitutional:      Appearance: He is well-developed and well-nourished.  HENT:     Head: Normocephalic and atraumatic.  Eyes:     Extraocular Movements: EOM normal.     Pupils: Pupils are equal, round, and reactive to light.  Pulmonary:     Effort: Pulmonary effort is normal.     Breath sounds: Normal breath sounds.  Abdominal:     General: Bowel sounds are normal.     Palpations: Abdomen is soft.  Musculoskeletal:     Cervical back: Normal range of motion and neck supple.     Lumbar back: Negative right straight leg raise test and negative left straight leg raise test.  Skin:    General: Skin is warm and dry.  Neurological:     Mental Status: He is alert and oriented to person, place, and time.  Psychiatric:        Mood and Affect: Mood  and affect normal.        Behavior: Behavior normal.        Thought Content: Thought content normal.        Judgment: Judgment normal.     Back Exam   Tenderness  The patient is experiencing tenderness in the lumbar.  Range of Motion  Extension: abnormal  Flexion: normal  Lateral bend right: normal  Lateral bend left: normal  Rotation right: normal  Rotation left: normal   Muscle Strength  Right Quadriceps:  5/5  Left Quadriceps:  5/5  Right Hamstrings:  5/5  Left Hamstrings:  5/5   Tests  Straight leg raise right: negative Straight leg raise left: negative  Other  Toe walk: normal Heel walk: normal  Comments:  Motor with no focal deficit.       Specialty Comments:  No specialty comments available.  Imaging: No results found.   PMFS History: Patient  Active Problem List   Diagnosis Date Noted  . Degenerative lumbar spinal stenosis 04/06/2019  . OSA on CPAP 12/03/2017  . Jock itch 07/31/2017  . Hyperlipidemia 01/28/2017  . Pulmonary nodules 12/26/2016  . Aortic atherosclerosis ([place]) 10/28/2016  . Genetic testing 10/14/2016  . Family history of colon cancer 10/02/2016  . Family history of breast cancer   . Hilar lymphadenopathy 09/23/2016  . Lymph nodes enlarged 09/23/2016  . Family history of genetic disease 08/28/2016  . Macular degeneration 07/21/2016  . Neuropathy   . DDD (degenerative disc disease), lumbar   . GAD (generalized anxiety disorder)   . Cigarette smoker    Past Medical History:  Diagnosis Date  . Anxiety    never tried on SSRI. xanax 0.45m BID through Dr. BTollie Pizza   . DDD (degenerative disc disease), lumbar    lumbar DDD- follows with orthopedist Dr. NLinus Ornortho. mobic 138mdaily and hydrocodone 5/325 prn  . Family history of breast cancer   . Macular degeneration 07/21/2016   Follows with optho- areds2. He states he is unsure of diagnosis.   . Neuropathy    unknown cause ? back related(sees ortho for back). burning sensation in feet. gabapentin 1008mfter dinner, 2 before bed  . Smoker    8-10 cigarettes a day. plus nicorette. 60 years. over 50 pack years  . Venous stasis     in the past- Wound center on legs. [facility]. Compression stockings.     Family History  Problem Relation Age of Onset  . Other Mother        "old age" per patient   . Dementia Mother   . Alcohol abuse Father        contributed  . Cancer Sister        unknown type  . Alcohol abuse Brother   . Breast cancer Sister        dx in her early 30s44s Other Sister        PALB2+  . Breast cancer Maternal Aunt   . Colon cancer Maternal Uncle   . Breast cancer Other 39    Past Surgical History:  Procedure Laterality Date  . ESOPHAGOGASTRODUODENOSCOPY Left 05/28/2016    Surgeon: JyoJuanita CraverD; food stuck in esophagus    Social History   Occupational History  . Occupation: semi retired  Tobacco Use  . Smoking status: Current Every Day Smoker    Packs/day: 1.00    Years: 62.00    Pack years: 62.00    Types: Cigarettes    Start date:  1958  . Smokeless tobacco: Never Used  . Tobacco comment: 02/10/19- .5 pack   Substance and Sexual Activity  . Alcohol use: Yes    Comment: rare drinking occasionally  . Drug use: No  . Sexual activity: Not on file

## 2020-08-12 ENCOUNTER — Other Ambulatory Visit: Payer: Self-pay | Admitting: Family Medicine

## 2020-08-22 ENCOUNTER — Telehealth: Payer: Self-pay

## 2020-08-22 DIAGNOSIS — M5416 Radiculopathy, lumbar region: Secondary | ICD-10-CM | POA: Diagnosis not present

## 2020-08-22 DIAGNOSIS — M4726 Other spondylosis with radiculopathy, lumbar region: Secondary | ICD-10-CM | POA: Diagnosis not present

## 2020-08-22 MED ORDER — ESCITALOPRAM OXALATE 5 MG PO TABS
5.0000 mg | ORAL_TABLET | Freq: Every day | ORAL | 3 refills | Status: DC
Start: 2020-08-22 — End: 2021-07-30

## 2020-08-22 NOTE — Telephone Encounter (Signed)
MEDICATION: escitalopram (LEXAPRO) 5 MG tablet  PHARMACY:  [id number] - [place], [place] - [address] Phone:  [phone]  Fax:  [phone]       Comments:   **Let patient know to contact pharmacy at the end of the day to make sure medication is ready. **  ** Please notify patient to allow 48-72 hours to process**  **Encourage patient to contact the pharmacy for refills or they can request refills through [facility]**

## 2020-08-22 NOTE — Telephone Encounter (Signed)
Medication has been refilled.

## 2020-08-23 ENCOUNTER — Other Ambulatory Visit: Payer: Self-pay | Admitting: Neurological Surgery

## 2020-08-24 ENCOUNTER — Encounter: Payer: Self-pay | Admitting: Physician Assistant

## 2020-08-24 ENCOUNTER — Other Ambulatory Visit: Payer: Self-pay

## 2020-08-24 ENCOUNTER — Ambulatory Visit (INDEPENDENT_AMBULATORY_CARE_PROVIDER_SITE_OTHER): Payer: Medicare Other | Admitting: Physician Assistant

## 2020-08-24 VITALS — BP 118/79 | HR 79 | Temp 97.6°F | Ht 70.0 in | Wt 216.2 lb

## 2020-08-24 DIAGNOSIS — H60502 Unspecified acute noninfective otitis externa, left ear: Secondary | ICD-10-CM | POA: Diagnosis not present

## 2020-08-24 MED ORDER — OFLOXACIN 0.3 % OT SOLN: 5.0000 [drp] | Freq: Two times a day (BID) | OTIC | 0 refills | Status: DC

## 2020-08-24 NOTE — Patient Instructions (Signed)
It looks like you have an infection of your ear canal. Please use the ear drops as directed. You may take Tylenol as needed for pain or a warm compress to the area. Recheck if worse or no improvement.     Otitis Externa  Otitis externa is an infection of the outer ear canal. The outer ear canal is the area between the outside of the ear and the eardrum. Otitis externa is sometimes called swimmer's ear. What are the causes? Common causes of this condition include:  Swimming in dirty water.  Moisture in the ear.  An injury to the inside of the ear.  An object stuck in the ear.  A cut or scrape on the outside of the ear. What increases the risk? You are more likely to get this condition if you go swimming often. What are the signs or symptoms?  Itching in the ear. This is often the first symptom.  Swelling of the ear.  Redness in the ear.  Ear pain. The pain may get worse when you pull on your ear.  Pus coming from the ear. How is this treated? This condition may be treated with:  Antibiotic ear drops. These are often given for 10-14 days.  Medicines to reduce itching and swelling. Follow these instructions at home:  If you were given antibiotic ear drops, use them as told by your doctor. Do not stop using them even if your condition gets better.  Take over-the-counter and prescription medicines only as told by your doctor.  Avoid getting water in your ears as told by your doctor. You may be told to avoid swimming or water sports for a few days.  Keep all follow-up visits as told by your doctor. This is important. How is this prevented?  Keep your ears dry. Use the corner of a towel to dry your ears after you swim or bathe.  Try not to scratch or put things in your ear. Doing these things makes it easier for germs to grow in your ear.  Avoid swimming in lakes, dirty water, or pools that may not have the right amount of a chemical called chlorine. Contact a  doctor if:  You have a fever.  Your ear is still red, swollen, or painful after 3 days.  You still have pus coming from your ear after 3 days.  Your redness, swelling, or pain gets worse.  You have a really bad headache.  You have redness, swelling, pain, or tenderness behind your ear. Summary  Otitis externa is an infection of the outer ear canal.  Symptoms include pain, redness, and swelling of the ear.  If you were given antibiotic ear drops, use them as told by your doctor. Do not stop using them even if your condition gets better.  Try not to scratch or put things in your ear. This information is not intended to replace advice given to you by your health care provider. Make sure you discuss any questions you have with your health care provider. Document Revised: 11/06/2017 Document Reviewed: 11/06/2017 Elsevier Patient Education  2021 [employer].

## 2020-08-24 NOTE — Progress Notes (Signed)
Acute Office Visit  Subjective:    Patient ID: Jonathon [name], male    DOB: 05-Mar-1941, 80 y.o.   MRN: [id number]  Chief Complaint  Patient presents with  . Ear Pain    HPI Patient is in today for left ear pain x several days. States he has been having some discharge coming out of the ear. No headaches or dizziness. No recent illness.   Past Medical History:  Diagnosis Date  . Anxiety    never tried on SSRI. xanax 0.47m BID through Dr. BTollie Pizza   . DDD (degenerative disc disease), lumbar    lumbar DDD- follows with orthopedist Dr. NLinus Ornortho. mobic 152mdaily and hydrocodone 5/325 prn  . Family history of breast cancer   . Macular degeneration 07/21/2016   Follows with optho- areds2. He states he is unsure of diagnosis.   . Neuropathy    unknown cause ? back related(sees ortho for back). burning sensation in feet. gabapentin 10022mfter dinner, 2 before bed  . Smoker    8-10 cigarettes a day. plus nicorette. 60 years. over 50 pack years  . Venous stasis     in the past- Wound center on legs. Elevate. Compression stockings.     Past Surgical History:  Procedure Laterality Date  . ESOPHAGOGASTRODUODENOSCOPY Left 05/28/2016    Surgeon: JyoJuanita CraverD; food stuck in esophagus    Family History  Problem Relation Age of Onset  . Other Mother        "old age" per patient   . Dementia Mother   . Alcohol abuse Father        contributed  . Cancer Sister        unknown type  . Alcohol abuse Brother   . Breast cancer Sister        dx in her early 30s87s Other Sister        PALB2+  . Breast cancer Maternal Aunt   . Colon cancer Maternal Uncle   . Breast cancer Other 39 82 Social History   Socioeconomic History  . Marital status: Married    Spouse name: Not on file  . Number of children: Not on file  . Years of education: Not on file  . Highest education level: Not on file  Occupational History  . Occupation: semi retired  Tobacco Use  . Smoking  status: Current Every Day Smoker    Packs/day: 1.00    Years: 62.00    Pack years: 62.00    Types: Cigarettes    Start date: 19528 Smokeless tobacco: Never Used  . Tobacco comment: 02/10/19- .5 pack   Substance and Sexual Activity  . Alcohol use: Yes    Comment: rare drinking occasionally  . Drug use: No  . Sexual activity: Not on file  Other Topics Concern  . Not on file  Social History Narrative   Married around 1964. 3 children. 6 grandkids.       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 Difficulty of Paying Living Expenses: Not hard at all  Food Insecurity: No Food Insecurity  . Worried About RunCharity fundraiser the Last Year: Never true  . Ran Out of Food in the Last Year: Never true  Transportation Needs: No Transportation Needs  . Lack of Transportation (Medical): No  . Lack  of Transportation (Non-Medical): No  Physical Activity: Inactive  . Days of Exercise per Week: 0 days  . Minutes of Exercise per Session: 0 min  Stress: No Stress Concern Present  . Feeling of Stress : Not at all  Social Connections: Moderately Isolated  . Frequency of Communication with Friends and Family: Three times a week  . Frequency of Social Gatherings with Friends and Family: Once a week  . Attends Religious Services: Never  . Active Member of Clubs or Organizations: No  . Attends Archivist Meetings: Never  . Marital Status: Married  Human resources officer Violence: Not At Risk  . Fear of Current or Ex-Partner: No  . Emotionally Abused: No  . Physically Abused: No  . Sexually Abused: No    Outpatient Medications Prior to Visit  Medication Sig Dispense Refill  . ALPRAZolam (XANAX) 0.5 MG tablet TAKE 1 TABLET BY MOUTH TWICE A DAY AS NEEDED FOR ANXIETY 37 tablet 4  . aspirin EC 81 MG tablet Take 81 mg by mouth daily.    Marland Kitchen atorvastatin  (LIPITOR) 20 MG tablet TAKE 1 TABLET BY MOUTH EVERY DAY 90 tablet 3  . escitalopram (LEXAPRO) 5 MG tablet Take 1 tablet (5 mg total) by mouth daily. 90 tablet 3  . gabapentin (NEURONTIN) 400 MG capsule 1 capsule midday and 2 capsules at bedtime 270 capsule 5  . ketoconazole (NIZORAL) 2 % cream Apply 1 application topically daily. For jock itch up to 2 weeks 60 g 2  . meloxicam (MOBIC) 15 MG tablet TAKE 1 TABLET BY MOUTH EVERY DAY WITH EVENING MEAL 90 tablet 3  . Multiple Vitamins-Minerals (PRESERVISION AREDS 2 PO) Take 1 tablet by mouth daily.    . Omega-3 Fatty Acids (OMEGA 3 500 PO) Take by mouth.    Marland Kitchen azithromycin (ZITHROMAX) 250 MG tablet Take 2 tabs day 1, then 1 tab daily 6 each 0  . methylPREDNISolone (MEDROL DOSEPAK) 4 MG TBPK tablet 6 day dose pack take as directed 21 tablet 0  . predniSONE (DELTASONE) 50 MG tablet Take 1 tablet daily for 5 days. 5 tablet 0   No facility-administered medications prior to visit.    Allergies  Allergen Reactions  . Penicillins Rash    Has patient had a PCN reaction causing immediate rash, facial/tongue/throat swelling, SOB or lightheadedness with hypotension: no Has patient had a PCN reaction causing severe rash involving mucus membranes or skin necrosis:yes Has patient had a PCN reaction that required hospitalization: no Has patient had a PCN reaction occurring within the last 10 years: no If all of the above answers are "NO", then may proceed with Cephalosporin use.     Review of Systems  Constitutional: Negative for fever.  HENT: Positive for ear discharge (CERUMEN), ear pain, hearing loss and tinnitus (chronic per pt). Negative for congestion, sinus pressure and sinus pain.   Eyes: Negative for itching.  Respiratory: Negative for cough.   Neurological: Negative for dizziness and headaches.       Objective:    Physical Exam Vitals and nursing note reviewed.  Constitutional:      Appearance: Normal appearance.  HENT:     Right Ear:  Hearing, tympanic membrane, ear canal and external ear normal.     Left Ear: Hearing and tympanic membrane normal. Drainage, swelling (mild swelling, canal still open) and tenderness present. There is no impacted cerumen.  Neurological:     Mental Status: He is alert.     BP 118/79   Pulse 79  Temp 97.6 F (36.4 C)   Ht 5' 10"  (1.778 m)   Wt 216 lb 4 oz (98.1 kg)   SpO2 97%   BMI 31.03 kg/m  Wt Readings from Last 3 Encounters:  08/24/20 216 lb 4 oz (98.1 kg)  08/01/20 213 lb (96.6 kg)  07/04/20 213 lb (96.6 kg)    Health Maintenance Due  Topic Date Due  . COVID-19 Vaccine (3 - Booster for Moderna series) 02/01/2020    There are no preventive care reminders to display for this patient.   No results found for: TSH Lab Results  Component Value Date   WBC 3.9 04/05/2020   HGB 13.1 (L) 04/05/2020   HCT 38.7 04/05/2020   MCV 96.3 04/05/2020   PLT 227 04/05/2020   Lab Results  Component Value Date   NA 140 04/05/2020   K 3.8 04/05/2020   CO2 29 04/05/2020   GLUCOSE 122 (H) 04/05/2020   BUN 12 04/05/2020   CREATININE 1.11 04/05/2020   BILITOT 0.6 04/05/2020   ALKPHOS 85 09/29/2019   AST 15 04/05/2020   ALT 12 04/05/2020   PROT 6.4 04/05/2020   ALBUMIN 3.7 09/29/2019   CALCIUM 9.1 04/05/2020   GFR 69.68 09/29/2019   Lab Results  Component Value Date   CHOL 119 09/29/2019   Lab Results  Component Value Date   HDL 35.00 (L) 09/29/2019   Lab Results  Component Value Date   LDLCALC 58 09/29/2019   Lab Results  Component Value Date   TRIG 126.0 09/29/2019   Lab Results  Component Value Date   CHOLHDL 3 09/29/2019   No results found for: HGBA1C     Assessment & Plan:   Problem List Items Addressed This Visit   None   Visit Diagnoses    Acute otitis externa of left ear, unspecified type    -  Primary       Meds ordered this encounter  Medications  . ofloxacin (FLOXIN) 0.3 % OTIC solution    Sig: Place 5 drops into the left ear 2 (two)  times daily for 5 days.    Dispense:  5 mL    Refill:  0    1. Acute otitis externa of left ear, unspecified type Left infection of ear canal. No wick needed today as ear canal is still open. Use Ofloxacin drops as directed. Warm compresses and Tylenol prn. Recheck if worse or no improvement of symptoms.  This visit occurred during the SARS-CoV-2 public health emergency.  Safety protocols were in place, including screening questions prior to the visit, additional usage of staff PPE, and extensive cleaning of exam room while observing appropriate contact time as indicated for disinfecting solutions.    Brenten Janney M Carlito Bogert, PA-C

## 2020-09-10 ENCOUNTER — Other Ambulatory Visit (HOSPITAL_COMMUNITY)
Admission: RE | Admit: 2020-09-10 | Discharge: 2020-09-10 | Disposition: A | Discharge status: Home / Self Care | Payer: Medicare Other | Source: Ambulatory Visit | Attending: Neurological Surgery | Admitting: Neurological Surgery

## 2020-09-10 DIAGNOSIS — Z01812 Encounter for preprocedural laboratory examination: Secondary | ICD-10-CM | POA: Insufficient documentation

## 2020-09-10 DIAGNOSIS — Z20822 Contact with and (suspected) exposure to covid-19: Secondary | ICD-10-CM | POA: Diagnosis not present

## 2020-09-10 LAB — SARS CORONAVIRUS 2 (TAT 6-24 HRS): SARS Coronavirus 2: NEGATIVE

## 2020-09-12 ENCOUNTER — Other Ambulatory Visit: Payer: Self-pay

## 2020-09-12 ENCOUNTER — Encounter (HOSPITAL_COMMUNITY): Payer: Self-pay | Admitting: Neurological Surgery

## 2020-09-12 NOTE — Progress Notes (Signed)
PCP - Dr. [name] Cardiologist - [facility] [place]   Chest x-ray -  EKG -  Stress Test -  ECHO -  Cardiac Cath -    Blood Thinner Instructions:  Aspirin Instructions: ASA last dose per pt 09/06/20  ERAS Protcol -   COVID TEST- 09/10/20 negative   Anesthesia review: n/a  -------------  SDW INSTRUCTIONS:  Your procedure is scheduled on 09/13/20. Please report to [facility] Main Entrance "A" at 0815 A.M., and check in at the Admitting office. Call this number if you have problems the morning of surgery: [phone]   Remember: Do not eat or drink after midnight the night before your surgery   Medications to take morning of surgery with a sip of water include: ALPRAZolam Prudy Feeler) if needed  atorvastatin (LIPITOR)  escitalopram (LEXAPRO)  gabapentin (NEURONTIN)    As of today, STOP taking any meloxicam (MOBIC), Aspirin (unless otherwise instructed by your surgeon), Aleve, Naproxen, Ibuprofen, Motrin, Advil, Goody's, BC's, all herbal medications, fish oil, and all vitamins.    The Morning of Surgery Do not wear jewelry Do not wear lotions, powders, colognes, or deodorant Men may shave face and neck. Do not bring valuables to the hospital. [facility] is not responsible for any belongings or valuables. If you are a smoker, DO NOT Smoke 24 hours prior to surgery If you wear a CPAP at night please bring your mask the morning of surgery  Remember that you must have someone to transport you home after your surgery, and remain with you for 24 hours if you are discharged the same day. Please bring cases for contacts, glasses, hearing aids, dentures or bridgework because it cannot be worn into surgery.   Patients discharged the day of surgery will not be allowed to drive home.   Please shower the NIGHT BEFORE SURGERY and the MORNING OF SURGERY with DIAL Soap. Wear comfortable clothes the morning of surgery. Oral Hygiene is also important to reduce your risk of infection.   Remember - BRUSH YOUR TEETH THE MORNING OF SURGERY WITH YOUR REGULAR TOOTHPASTE  Patient denies shortness of breath, fever, cough and chest pain.

## 2020-09-13 ENCOUNTER — Ambulatory Visit (HOSPITAL_COMMUNITY): Payer: Medicare Other | Admitting: Certified Registered Nurse Anesthetist

## 2020-09-13 ENCOUNTER — Encounter (HOSPITAL_COMMUNITY)
Admission: RE | Disposition: A | Discharge status: Home / Self Care | Payer: Self-pay | Source: Home / Self Care | Attending: Neurological Surgery

## 2020-09-13 ENCOUNTER — Other Ambulatory Visit: Payer: Self-pay

## 2020-09-13 ENCOUNTER — Encounter (HOSPITAL_COMMUNITY): Payer: Self-pay | Admitting: Neurological Surgery

## 2020-09-13 ENCOUNTER — Observation Stay (HOSPITAL_COMMUNITY)
Admission: RE | Admit: 2020-09-13 | Discharge: 2020-09-13 | Disposition: A | Discharge status: Home / Self Care | Payer: Medicare Other | Attending: Neurological Surgery | Admitting: Neurological Surgery

## 2020-09-13 ENCOUNTER — Ambulatory Visit (HOSPITAL_COMMUNITY): Payer: Medicare Other

## 2020-09-13 DIAGNOSIS — M5416 Radiculopathy, lumbar region: Secondary | ICD-10-CM | POA: Diagnosis present

## 2020-09-13 DIAGNOSIS — M7138 Other bursal cyst, other site: Secondary | ICD-10-CM | POA: Diagnosis not present

## 2020-09-13 DIAGNOSIS — Z7982 Long term (current) use of aspirin: Secondary | ICD-10-CM | POA: Diagnosis not present

## 2020-09-13 DIAGNOSIS — G4733 Obstructive sleep apnea (adult) (pediatric): Secondary | ICD-10-CM | POA: Diagnosis not present

## 2020-09-13 DIAGNOSIS — Z981 Arthrodesis status: Secondary | ICD-10-CM | POA: Diagnosis not present

## 2020-09-13 DIAGNOSIS — M48061 Spinal stenosis, lumbar region without neurogenic claudication: Secondary | ICD-10-CM | POA: Diagnosis not present

## 2020-09-13 DIAGNOSIS — Z419 Encounter for procedure for purposes other than remedying health state, unspecified: Secondary | ICD-10-CM

## 2020-09-13 DIAGNOSIS — M79662 Pain in left lower leg: Secondary | ICD-10-CM | POA: Diagnosis present

## 2020-09-13 DIAGNOSIS — M4726 Other spondylosis with radiculopathy, lumbar region: Principal | ICD-10-CM | POA: Insufficient documentation

## 2020-09-13 HISTORY — PX: LUMBAR LAMINECTOMY/DECOMPRESSION MICRODISCECTOMY: SHX5026

## 2020-09-13 HISTORY — DX: Hyperlipidemia, unspecified: E78.5

## 2020-09-13 LAB — CBC
HCT: 41.3 % (ref 39.0–52.0)
Hemoglobin: 13.5 g/dL (ref 13.0–17.0)
MCH: 32.8 pg (ref 26.0–34.0)
MCHC: 32.7 g/dL (ref 30.0–36.0)
MCV: 100.5 fL — ABNORMAL HIGH (ref 80.0–100.0)
Platelets: 199 10*3/uL (ref 150–400)
RBC: 4.11 MIL/uL — ABNORMAL LOW (ref 4.22–5.81)
RDW: 13.2 % (ref 11.5–15.5)
WBC: 4.3 10*3/uL (ref 4.0–10.5)
nRBC: 0 % (ref 0.0–0.2)

## 2020-09-13 LAB — BASIC METABOLIC PANEL
Anion gap: 8 (ref 5–15)
BUN: 14 mg/dL (ref 8–23)
CO2: 26 mmol/L (ref 22–32)
Calcium: 9.1 mg/dL (ref 8.9–10.3)
Chloride: 105 mmol/L (ref 98–111)
Creatinine, Ser: 0.97 mg/dL (ref 0.61–1.24)
GFR, Estimated: >60 mL/min (ref >60)
Glucose, Bld: 90 mg/dL (ref 70–99)
Potassium: 3.6 mmol/L (ref 3.5–5.1)
Sodium: 139 mmol/L (ref 135–145)

## 2020-09-13 LAB — SURGICAL PCR SCREEN
MRSA, PCR: NEGATIVE
Staphylococcus aureus: NEGATIVE

## 2020-09-13 SURGERY — LUMBAR LAMINECTOMY/DECOMPRESSION MICRODISCECTOMY 1 LEVEL
Anesthesia: General | Site: Back | Laterality: Left

## 2020-09-13 MED ORDER — DOCUSATE SODIUM 100 MG PO CAPS: 100.0000 mg | ORAL_CAPSULE | Freq: Two times a day (BID) | ORAL | Status: DC

## 2020-09-13 MED ORDER — HYDROMORPHONE HCL 1 MG/ML IJ SOLN: 0.2500 mg | INTRAMUSCULAR | Status: DC | PRN

## 2020-09-13 MED ORDER — EPHEDRINE SULFATE 50 MG/ML IJ SOLN
INTRAMUSCULAR | Status: DC | PRN
  Administered 2020-09-13 (×2): 5 mg via INTRAVENOUS

## 2020-09-13 MED ORDER — LIDOCAINE-EPINEPHRINE 1 %-1:100000 IJ SOLN
INTRAMUSCULAR | Status: AC
  Filled 2020-09-13: qty 1

## 2020-09-13 MED ORDER — FLEET ENEMA 7-19 GM/118ML RE ENEM: 1.0000 | ENEMA | Freq: Once | RECTAL | Status: DC | PRN

## 2020-09-13 MED ORDER — SODIUM CHLORIDE 0.9 % IV SOLN
250.0000 mL | INTRAVENOUS | Status: DC
  Administered 2020-09-13: 250 mL via INTRAVENOUS

## 2020-09-13 MED ORDER — DEXAMETHASONE SODIUM PHOSPHATE 10 MG/ML IJ SOLN
INTRAMUSCULAR | Status: DC | PRN
  Administered 2020-09-13: 10 mg via INTRAVENOUS

## 2020-09-13 MED ORDER — ALPRAZOLAM 0.5 MG PO TABS: 0.5000 mg | ORAL_TABLET | Freq: Two times a day (BID) | ORAL | Status: DC | PRN

## 2020-09-13 MED ORDER — CHLORHEXIDINE GLUCONATE 0.12 % MT SOLN: 15.0000 mL | Freq: Once | OROMUCOSAL | Status: AC

## 2020-09-13 MED ORDER — POLYETHYLENE GLYCOL 3350 17 G PO PACK: 17.0000 g | PACK | Freq: Every day | ORAL | Status: DC | PRN

## 2020-09-13 MED ORDER — ORAL CARE MOUTH RINSE: 15.0000 mL | Freq: Once | OROMUCOSAL | Status: AC

## 2020-09-13 MED ORDER — FENTANYL CITRATE (PF) 250 MCG/5ML IJ SOLN
INTRAMUSCULAR | Status: AC
  Filled 2020-09-13: qty 5

## 2020-09-13 MED ORDER — PHENOL 1.4 % MT LIQD: 1.0000 | OROMUCOSAL | Status: DC | PRN

## 2020-09-13 MED ORDER — THROMBIN 5000 UNITS EX SOLR: OROMUCOSAL | Status: DC | PRN

## 2020-09-13 MED ORDER — PROPOFOL 10 MG/ML IV BOLUS
INTRAVENOUS | Status: DC | PRN
  Administered 2020-09-13: 140 mg via INTRAVENOUS

## 2020-09-13 MED ORDER — ATORVASTATIN CALCIUM 10 MG PO TABS: 20.0000 mg | ORAL_TABLET | Freq: Every day | ORAL | Status: DC

## 2020-09-13 MED ORDER — ESCITALOPRAM OXALATE 5 MG PO TABS
5.0000 mg | ORAL_TABLET | Freq: Every day | ORAL | Status: DC
  Filled 2020-09-13: qty 1

## 2020-09-13 MED ORDER — GABAPENTIN 400 MG PO CAPS: 800.0000 mg | ORAL_CAPSULE | Freq: Every day | ORAL | Status: DC

## 2020-09-13 MED ORDER — ONDANSETRON HCL 4 MG/2ML IJ SOLN
INTRAMUSCULAR | Status: AC
  Filled 2020-09-13: qty 2

## 2020-09-13 MED ORDER — CHLORHEXIDINE GLUCONATE CLOTH 2 % EX PADS: 6.0000 | MEDICATED_PAD | Freq: Once | CUTANEOUS | Status: DC

## 2020-09-13 MED ORDER — METHOCARBAMOL 1000 MG/10ML IJ SOLN
500.0000 mg | Freq: Four times a day (QID) | INTRAVENOUS | Status: DC | PRN
  Filled 2020-09-13: qty 5

## 2020-09-13 MED ORDER — ROCURONIUM BROMIDE 10 MG/ML (PF) SYRINGE
PREFILLED_SYRINGE | INTRAVENOUS | Status: DC | PRN
  Administered 2020-09-13: 60 mg via INTRAVENOUS

## 2020-09-13 MED ORDER — SODIUM CHLORIDE 0.9% FLUSH: 3.0000 mL | Freq: Two times a day (BID) | INTRAVENOUS | Status: DC

## 2020-09-13 MED ORDER — PROPOFOL 10 MG/ML IV BOLUS
INTRAVENOUS | Status: AC
  Filled 2020-09-13: qty 20

## 2020-09-13 MED ORDER — MENTHOL 3 MG MT LOZG: 1.0000 | LOZENGE | OROMUCOSAL | Status: DC | PRN

## 2020-09-13 MED ORDER — SODIUM CHLORIDE 0.9% FLUSH: 3.0000 mL | INTRAVENOUS | Status: DC | PRN

## 2020-09-13 MED ORDER — CHLORHEXIDINE GLUCONATE 0.12 % MT SOLN
OROMUCOSAL | Status: AC
  Administered 2020-09-13: 15 mL via OROMUCOSAL
  Filled 2020-09-13: qty 15

## 2020-09-13 MED ORDER — PHENYLEPHRINE HCL (PRESSORS) 10 MG/ML IV SOLN
INTRAVENOUS | Status: DC | PRN
  Administered 2020-09-13 (×2): 120 ug via INTRAVENOUS

## 2020-09-13 MED ORDER — THROMBIN 5000 UNITS EX SOLR
CUTANEOUS | Status: AC
  Filled 2020-09-13: qty 5000

## 2020-09-13 MED ORDER — ACETAMINOPHEN 10 MG/ML IV SOLN: 1000.0000 mg | Freq: Once | INTRAVENOUS | Status: DC | PRN

## 2020-09-13 MED ORDER — GABAPENTIN 400 MG PO CAPS: 400.0000 mg | ORAL_CAPSULE | ORAL | Status: DC

## 2020-09-13 MED ORDER — PROMETHAZINE HCL 25 MG/ML IJ SOLN: 6.2500 mg | INTRAMUSCULAR | Status: DC | PRN

## 2020-09-13 MED ORDER — ACETAMINOPHEN 325 MG PO TABS: 650.0000 mg | ORAL_TABLET | ORAL | Status: DC | PRN

## 2020-09-13 MED ORDER — KETOROLAC TROMETHAMINE 15 MG/ML IJ SOLN
7.5000 mg | Freq: Four times a day (QID) | INTRAMUSCULAR | Status: DC
  Administered 2020-09-13: 7.5 mg via INTRAVENOUS
  Filled 2020-09-13: qty 1

## 2020-09-13 MED ORDER — LIDOCAINE 2% (20 MG/ML) 5 ML SYRINGE
INTRAMUSCULAR | Status: DC | PRN
  Administered 2020-09-13: 80 mg via INTRAVENOUS

## 2020-09-13 MED ORDER — ONDANSETRON HCL 4 MG PO TABS: 4.0000 mg | ORAL_TABLET | Freq: Four times a day (QID) | ORAL | Status: DC | PRN

## 2020-09-13 MED ORDER — BISACODYL 10 MG RE SUPP: 10.0000 mg | Freq: Every day | RECTAL | Status: DC | PRN

## 2020-09-13 MED ORDER — 0.9 % SODIUM CHLORIDE (POUR BTL) OPTIME
TOPICAL | Status: DC | PRN
  Administered 2020-09-13: 1000 mL

## 2020-09-13 MED ORDER — BUPIVACAINE HCL (PF) 0.5 % IJ SOLN
INTRAMUSCULAR | Status: DC | PRN
  Administered 2020-09-13: 4 mL
  Administered 2020-09-13: 20 mL

## 2020-09-13 MED ORDER — ONDANSETRON HCL 4 MG/2ML IJ SOLN: 4.0000 mg | Freq: Four times a day (QID) | INTRAMUSCULAR | Status: DC | PRN

## 2020-09-13 MED ORDER — METHOCARBAMOL 500 MG PO TABS: 500.0000 mg | ORAL_TABLET | Freq: Four times a day (QID) | ORAL | 1 refills | Status: DC | PRN

## 2020-09-13 MED ORDER — LIDOCAINE-EPINEPHRINE 1 %-1:100000 IJ SOLN
INTRAMUSCULAR | Status: DC | PRN
  Administered 2020-09-13: 4 mL

## 2020-09-13 MED ORDER — PHENYLEPHRINE 40 MCG/ML (10ML) SYRINGE FOR IV PUSH (FOR BLOOD PRESSURE SUPPORT)
PREFILLED_SYRINGE | INTRAVENOUS | Status: AC
  Filled 2020-09-13: qty 10

## 2020-09-13 MED ORDER — OXYCODONE-ACETAMINOPHEN 5-325 MG PO TABS: 1.0000 | ORAL_TABLET | ORAL | 0 refills | Status: DC | PRN

## 2020-09-13 MED ORDER — PHENYLEPHRINE HCL-NACL 10-0.9 MG/250ML-% IV SOLN
INTRAVENOUS | Status: DC | PRN
  Administered 2020-09-13: 25 ug/min via INTRAVENOUS

## 2020-09-13 MED ORDER — METHOCARBAMOL 500 MG PO TABS: 500.0000 mg | ORAL_TABLET | Freq: Four times a day (QID) | ORAL | Status: DC | PRN

## 2020-09-13 MED ORDER — BUPIVACAINE HCL (PF) 0.5 % IJ SOLN
INTRAMUSCULAR | Status: AC
  Filled 2020-09-13: qty 30

## 2020-09-13 MED ORDER — ACETAMINOPHEN 650 MG RE SUPP: 650.0000 mg | RECTAL | Status: DC | PRN

## 2020-09-13 MED ORDER — MORPHINE SULFATE (PF) 2 MG/ML IV SOLN: 2.0000 mg | INTRAVENOUS | Status: DC | PRN

## 2020-09-13 MED ORDER — SENNA 8.6 MG PO TABS: 1.0000 | ORAL_TABLET | Freq: Two times a day (BID) | ORAL | Status: DC

## 2020-09-13 MED ORDER — VANCOMYCIN HCL IN DEXTROSE 1-5 GM/200ML-% IV SOLN
1000.0000 mg | INTRAVENOUS | Status: AC
  Administered 2020-09-13: 1000 mg via INTRAVENOUS
  Filled 2020-09-13: qty 200

## 2020-09-13 MED ORDER — ONDANSETRON HCL 4 MG/2ML IJ SOLN
INTRAMUSCULAR | Status: DC | PRN
  Administered 2020-09-13: 4 mg via INTRAVENOUS

## 2020-09-13 MED ORDER — FENTANYL CITRATE (PF) 100 MCG/2ML IJ SOLN
INTRAMUSCULAR | Status: DC | PRN
  Administered 2020-09-13 (×3): 50 ug via INTRAVENOUS

## 2020-09-13 MED ORDER — OXYCODONE-ACETAMINOPHEN 5-325 MG PO TABS
1.0000 | ORAL_TABLET | ORAL | Status: DC | PRN
  Administered 2020-09-13: 1 via ORAL
  Filled 2020-09-13: qty 1

## 2020-09-13 MED ORDER — LACTATED RINGERS IV SOLN: INTRAVENOUS | Status: DC

## 2020-09-13 MED ORDER — ALUM & MAG HYDROXIDE-SIMETH 200-200-20 MG/5ML PO SUSP: 30.0000 mL | Freq: Four times a day (QID) | ORAL | Status: DC | PRN

## 2020-09-13 SURGICAL SUPPLY — 53 items
ADH SKN CLS APL DERMABOND .7 (GAUZE/BANDAGES/DRESSINGS) ×1
BAND INSRT 18 STRL LF DISP RB (MISCELLANEOUS)
BAND RUBBER #18 3X1/16 STRL (MISCELLANEOUS) IMPLANT
BLADE CLIPPER SURG (BLADE) ×1 IMPLANT
BUR ACORN 6.0 (BURR) IMPLANT
BUR MATCHSTICK NEURO 3.0 LAGG (BURR) ×2 IMPLANT
CANISTER SUCT 3000ML PPV (MISCELLANEOUS) ×2 IMPLANT
CARTRIDGE OIL MAESTRO DRILL (MISCELLANEOUS) ×1 IMPLANT
COVER WAND RF STERILE (DRAPES) ×1 IMPLANT
DECANTER SPIKE VIAL GLASS SM (MISCELLANEOUS) ×2 IMPLANT
DERMABOND ADVANCED (GAUZE/BANDAGES/DRESSINGS) ×1
DERMABOND ADVANCED .7 DNX12 (GAUZE/BANDAGES/DRESSINGS) ×1 IMPLANT
DIFFUSER DRILL AIR PNEUMATIC (MISCELLANEOUS) ×1 IMPLANT
DRAPE HALF SHEET 40X57 (DRAPES) IMPLANT
DRAPE LAPAROTOMY 100X72X124 (DRAPES) ×2 IMPLANT
DRAPE MICROSCOPE LEICA (MISCELLANEOUS) IMPLANT
DURAPREP 26ML APPLICATOR (WOUND CARE) ×2 IMPLANT
ELECT REM PT RETURN 9FT ADLT (ELECTROSURGICAL) ×2
ELECTRODE REM PT RTRN 9FT ADLT (ELECTROSURGICAL) ×1 IMPLANT
GAUZE 4X4 16PLY RFD (DISPOSABLE) IMPLANT
GAUZE SPONGE 4X4 12PLY STRL (GAUZE/BANDAGES/DRESSINGS) ×1 IMPLANT
GLOVE BIOGEL PI IND STRL 8.5 (GLOVE) ×1 IMPLANT
GLOVE BIOGEL PI INDICATOR 8.5 (GLOVE) ×1
GLOVE ECLIPSE 8.5 STRL (GLOVE) ×2 IMPLANT
GLOVE EXAM NITRILE XL STR (GLOVE) IMPLANT
GLOVE SURG POLYISO LF SZ6 (GLOVE) ×2 IMPLANT
GLOVE SURG POLYISO LF SZ7.5 (GLOVE) ×5 IMPLANT
GLOVE SURG UNDER POLY LF SZ6.5 (GLOVE) ×1 IMPLANT
GOWN STRL REUS W/ TWL LRG LVL3 (GOWN DISPOSABLE) IMPLANT
GOWN STRL REUS W/ TWL XL LVL3 (GOWN DISPOSABLE) IMPLANT
GOWN STRL REUS W/TWL 2XL LVL3 (GOWN DISPOSABLE) ×2 IMPLANT
GOWN STRL REUS W/TWL LRG LVL3 (GOWN DISPOSABLE) ×4
GOWN STRL REUS W/TWL XL LVL3 (GOWN DISPOSABLE) ×2
HEMOSTAT POWDER KIT SURGIFOAM (HEMOSTASIS) ×1 IMPLANT
KIT BASIN OR (CUSTOM PROCEDURE TRAY) ×2 IMPLANT
KIT TURNOVER KIT B (KITS) ×2 IMPLANT
NDL SPNL 20GX3.5 QUINCKE YW (NEEDLE) IMPLANT
NEEDLE HYPO 22GX1.5 SAFETY (NEEDLE) ×2 IMPLANT
NEEDLE SPNL 20GX3.5 QUINCKE YW (NEEDLE) IMPLANT
NS IRRIG 1000ML POUR BTL (IV SOLUTION) ×2 IMPLANT
OIL CARTRIDGE MAESTRO DRILL (MISCELLANEOUS)
PACK LAMINECTOMY NEURO (CUSTOM PROCEDURE TRAY) ×2 IMPLANT
PAD ARMBOARD 7.5X6 YLW CONV (MISCELLANEOUS) ×6 IMPLANT
PATTIES SURGICAL .5 X1 (DISPOSABLE) ×2 IMPLANT
SPONGE SURGIFOAM ABS GEL SZ50 (HEMOSTASIS) ×1 IMPLANT
SUT VIC AB 1 CT1 18XBRD ANBCTR (SUTURE) ×1 IMPLANT
SUT VIC AB 1 CT1 8-18 (SUTURE) ×2
SUT VIC AB 2-0 CP2 18 (SUTURE) ×2 IMPLANT
SUT VIC AB 3-0 SH 8-18 (SUTURE) ×2 IMPLANT
SUT VIC AB 4-0 RB1 18 (SUTURE) ×2 IMPLANT
TOWEL GREEN STERILE (TOWEL DISPOSABLE) ×2 IMPLANT
TOWEL GREEN STERILE FF (TOWEL DISPOSABLE) ×2 IMPLANT
WATER STERILE IRR 1000ML POUR (IV SOLUTION) ×2 IMPLANT

## 2020-09-13 NOTE — Anesthesia Procedure Notes (Signed)
Procedure Name: Intubation Date/Time: 09/13/2020 11:00 AM Performed by: Inda Coke, CRNA Pre-anesthesia Checklist: Patient identified, Emergency Drugs available, Suction available and Patient being monitored Patient Re-evaluated:Patient Re-evaluated prior to induction Oxygen Delivery Method: Circle System Utilized Preoxygenation: Pre-oxygenation with 100% oxygen Induction Type: IV induction Ventilation: Mask ventilation without difficulty and Oral airway inserted - appropriate to patient size Laryngoscope Size: Mac and 4 Grade View: Grade II Tube type: Oral Tube size: 7.5 mm Number of attempts: 1 Airway Equipment and Method: Stylet and Oral airway Placement Confirmation: ETT inserted through vocal cords under direct vision,  positive ETCO2 and breath sounds checked- equal and bilateral Secured at: 22 cm Tube secured with: Tape Dental Injury: Teeth and Oropharynx as per pre-operative assessment

## 2020-09-13 NOTE — Anesthesia Postprocedure Evaluation (Signed)
Anesthesia Post Note  Patient: Jonathon [name]  Procedure(s) Performed: Left Lumbar four-five Laminectomy for facet/synovial cyst (Left Back)     Patient location during evaluation: PACU Anesthesia Type: General Level of consciousness: awake and alert Pain management: pain level controlled Vital Signs Assessment: post-procedure vital signs reviewed and stable Respiratory status: spontaneous breathing, nonlabored ventilation, respiratory function stable and patient connected to nasal cannula oxygen Cardiovascular status: blood pressure returned to baseline and stable Postop Assessment: no apparent nausea or vomiting Anesthetic complications: no   No complications documented.  Last Vitals:  Vitals:   09/13/20 1407 09/13/20 1442  BP: 104/62 (!) 120/56  Pulse: 63 62  Resp: 16 20  Temp: 36.5 C 36.7 C  SpO2: 90% 95%    Last Pain:  Vitals:   09/13/20 1442  TempSrc: Oral  PainSc:                  Jonathon [name]

## 2020-09-13 NOTE — Anesthesia Preprocedure Evaluation (Signed)
Anesthesia Evaluation  Patient identified by MRN, date of birth, ID band Patient awake    Reviewed: Allergy & Precautions, NPO status , Patient's Chart, lab work & pertinent test results  Airway Mallampati: II  TM Distance: >3 FB Neck ROM: Full    Dental  (+) Teeth Intact   Pulmonary sleep apnea , Current Smoker and Patient abstained from smoking.,    Pulmonary exam normal        Cardiovascular negative cardio ROS   Rhythm:Regular Rate:Normal     Neuro/Psych Anxiety negative neurological ROS     GI/Hepatic negative GI ROS, Neg liver ROS,   Endo/Other  negative endocrine ROS  Renal/GU negative Renal ROS  negative genitourinary   Musculoskeletal  (+) Arthritis , Osteoarthritis,    Abdominal (+)  Abdomen: soft. Bowel sounds: normal.  Peds  Hematology negative hematology ROS (+)   Anesthesia Other Findings   Reproductive/Obstetrics                             Anesthesia Physical Anesthesia Plan  ASA: II  Anesthesia Plan: General   Post-op Pain Management:    Induction: Intravenous  PONV Risk Score and Plan: 1 and Dexamethasone, Ondansetron and Treatment may vary due to age or medical condition  Airway Management Planned: Mask and Oral ETT  Additional Equipment: None  Intra-op Plan:   Post-operative Plan: Extubation in OR  Informed Consent: I have reviewed the patients History and Physical, chart, labs and discussed the procedure including the risks, benefits and alternatives for the proposed anesthesia with the patient or authorized representative who has indicated his/her understanding and acceptance.     Dental advisory given  Plan Discussed with: CRNA  Anesthesia Plan Comments:         Anesthesia Quick Evaluation

## 2020-09-13 NOTE — Plan of Care (Signed)
Patient adequately ready for discharged

## 2020-09-13 NOTE — Progress Notes (Signed)
Patient alert and oriented, voiding adequately, MAE well with no difficulty. Incision area cdi with no s/s of infection. Patient discharged home per order. Patient and wife stated understanding of discharge instructions given. Patient has an appointment with Dr. Danielle Dess

## 2020-09-13 NOTE — Discharge Summary (Addendum)
Physician Discharge Summary  Patient ID: Jonathon [name] MRN: [id number] DOB/AGE: 01/27/1941 80 y.o.  Admit date: 09/13/2020 Discharge date: 09/13/2020  Admission Diagnoses:spondylosis with radiculopathy on left L4-5  Discharge Diagnoses: spondylosis with radiculopathy on left L4-5 Active Problems:   Lumbar radiculopathy, chronic   Discharged Condition: good  Hospital Course: uncomplicated surgery  Consults: None  Significant Diagnostic Studies: none  Treatments: surgery: laminotomy L4-5 left resection of synovial cyst  Discharge Exam: Blood pressure (!) 120/56, pulse 62, temperature 98 F (36.7 C), temperature source Oral, resp. rate 20, height 5\' 10"  (1.778 m), weight 96.6 kg, SpO2 95 %. incision clean and dry gait nlormal  Disposition: Discharge disposition: 01-Home or Self Care       Discharge Instructions    Call MD for:  redness, tenderness, or signs of infection (pain, swelling, redness, odor or green/yellow discharge around incision site)   Complete by: As directed    Call MD for:  severe uncontrolled pain   Complete by: As directed    Call MD for:  temperature >100.4   Complete by: As directed    Diet - low sodium heart healthy   Complete by: As directed    Discharge wound care:   Complete by: As directed    Okay to shower. Do not apply salves or appointments to incision. No heavy lifting with the upper extremities greater than 10 pounds. May resume driving when not requiring pain medication and patient feels comfortable with doing so.   Incentive spirometry RT   Complete by: As directed    Increase activity slowly   Complete by: As directed         Signed: 09/13/2020, 7:04 PM

## 2020-09-13 NOTE — H&P (Signed)
CHIEF COMPLAINT: Pain in the buttock and left lower extremity for over a year.  HISTORY OF PRESENT ILLNESS: Jonathon [name] is a 80 year old right-handed individual who tells me that back in January of 2021 he started developing some pain in the buttock and right lower extremity.  [facility]  He saw Dr. Otelia Sergeant.  He was given some oral steroids which he notes would give relief for about 6 weeks' period of time.  He was subsequently given an injection which seemed to work a little longer, but here lately, he has been having persistent worsening pain in the buttock and left lower extremity.  He notes that if he does very little or nothing he can get through an 8-hour day without much discomfort, but if he is up and about for any length of time, he has to get completely horizontal in order to get relief of pain that is searing in his buttock and left leg.  He notes that when he walks up stairs, he will tend to lag with his left leg and tends to lead with his right.  He, however, has not noticed any loss of strength.  PAST MEDICAL HISTORY: Reveals that his general history has been very good.  He does take some medications in the form of Alprazolam, Baby Aspirin, Atorvastatin, Escitalopram, Gabapentin, Meloxicam, and Preserv vitamins for vision.  PHYSICAL EXAM: I note that he stands straight and erect.  He walks with a slightly wide-based gait but demonstrates good strength in the iliopsoas, the quadriceps, the tibialis anterior and the gastrocs.  He has the capacity to toe and heel-walk.  His deep tendon reflexes are absent in the patellae and the Achilles.  Straight-leg raising is negative to 60 degrees.  Patrick maneuver is negative.  ASSESSMENT AND PLAN: Review of the patient's MRI scan as available on the PACS system from October of 2021 demonstrates that the patient has 5 lumbar-type vertebrae and a vestigial lower disc.  At L4-5, he has a left-sided synovial cyst that causes  lateral recess compression of the exiting L5 nerve root.  This fits perfectly with the symptoms that he is getting in an L5 distribution.  I demonstrated the findings to him.  I note that he has other spondylosis throughout the lumbar spine, but clearly the one that is causing compromise is at L4-5 on that left side.  I advised that he would be a candidate for a microsurgery to decompress the synovial cyst and the path of the L5 nerve root.  This would be done using a METRx tube dissector and is done typically on an outpatient basis.  We can plan on doing this at the surgical center sometime in the near future.  He has been through trials of conservative treatment and he notes that any treatment that he has had for the past year-and-a-half has yielded little transient relief but nothing long-term.  He feels the symptoms are becoming increasingly persistent.  I believe that decompressing the path of the L5 nerve root will yield him the best chance of getting good long-term relief.  I did indicate that he would have to give himself a period of about 4 or 5 weeks after the surgery to heal before he starts resuming more vigorous activity.

## 2020-09-13 NOTE — Evaluation (Signed)
Physical Therapy Evaluation Patient Details Name: Jonathon [name] MRN: [id number] DOB: 10-06-1940 Today's Date: 09/13/2020   History of Present Illness  80 y.o. male presenting to [facility] hospital on 3/31 with pain in buttocks radiating to RLE. MRI scan from October 2021 demonstrates a left-sided synovial cyst that causes lateral recess compression of the exiting L5 nerve root. Pt underwent lumbar laminotomy and foraminotomies partial facetectomy L4-5 left with excision of synovial cyst and decompression of L5 nerve root on 09/13/2020. PMH includes macular degeneration, neuropathy, DDD, anxiety, HLD.  Clinical Impression  Pt presents to PT s/p L4-5 laminotomy with synovial cyst resection. Pt is able to complete all mobility independently or at a modI level. Pt demonstrates good adherence to back precautions with bed mobility and PT provides education to patient and spouse on precautions. Pt appears to be mobilizing near his baseline level and expresses no concerns for mobility at this time. Pt has no further acute PT needs and is encouraged to ambulate out of the room at least 3 times daily for the remainder of admission. PT recommends discharge home with no PT or DME needs. Acute PT signing out.    Follow Up Recommendations No PT follow up    Equipment Recommendations  None recommended by PT    Recommendations for Other Services       Precautions / Restrictions Precautions Precautions: Back Precaution Booklet Issued: Yes (comment) Precaution Comments: PT provides education on back precautions, log roll, lower body dressing Required Braces or Orthoses:  (no brace needed) Restrictions Weight Bearing Restrictions: No      Mobility  Bed Mobility Overal bed mobility: Modified Independent             General bed mobility comments: pt rolls to right side and performs sidelying to sit with PT cues for technique    Transfers Overall transfer level: Independent                   Ambulation/Gait Ambulation/Gait assistance: Independent Gait Distance (Feet): 200 Feet Assistive device: None Gait Pattern/deviations: Step-through pattern Gait velocity: functional Gait velocity interpretation: 1.31 - 2.62 ft/sec, indicative of limited community ambulator General Gait Details: pt with slowed step-through gait  Stairs Stairs: Yes Stairs assistance: Modified independent (Device/Increase time) Stair Management: One rail Right;Alternating pattern;Forwards Number of Stairs: 4    Wheelchair Mobility    Modified Rankin (Stroke Patients Only)       Balance                                             Pertinent Vitals/Pain Pain Assessment: No/denies pain    Home Living Family/patient expects to be discharged to:: Private residence Living Arrangements: Spouse/significant other Available Help at Discharge: Family;Available 24 hours/day Type of Home: House Home Access: Stairs to enter Entrance Stairs-Rails: Can reach both Entrance Stairs-Number of Steps: 3 Home Layout: One level Home Equipment: Cane - single point      Prior Function Level of Independence: Independent         Comments: limited by pain recently but still mobilizing independently     Hand Dominance        Extremity/Trunk Assessment             Cervical / Trunk Assessment Cervical / Trunk Assessment: Other exceptions Cervical / Trunk Exceptions: incision clean, dry, intact  Communication   Communication: No difficulties  Cognition Arousal/Alertness: Awake/alert Behavior During Therapy: WFL for tasks assessed/performed Overall Cognitive Status: Within Functional Limits for tasks assessed                                        General Comments General comments (skin integrity, edema, etc.): VSS on RA    Exercises     Assessment/Plan    PT Assessment Patent does not need any further PT services  PT Problem List         PT  Treatment Interventions      PT Goals (Current goals can be found in the Care Plan section)       Frequency     Barriers to discharge        Co-evaluation               AM-PAC PT "6 Clicks" Mobility  Outcome Measure Help needed turning from your back to your side while in a flat bed without using bedrails?: None Help needed moving from lying on your back to sitting on the side of a flat bed without using bedrails?: None Help needed moving to and from a bed to a chair (including a wheelchair)?: None Help needed standing up from a chair using your arms (e.g., wheelchair or bedside chair)?: None Help needed to walk in hospital room?: None Help needed climbing 3-5 steps with a railing? : None 6 Click Score: 24    End of Session   Activity Tolerance: Patient tolerated treatment well Patient left: in bed;with call bell/phone within reach;with family/visitor present Nurse Communication: Mobility status      Time: 5462-7035 PT Time Calculation (min) (ACUTE ONLY): 18 min   Charges:   PT Evaluation $PT Eval Low Complexity: 1 Low          Arlyss Gandy, PT, DPT Acute Rehabilitation Pager: [phone]   Arlyss Gandy 09/13/2020, 3:18 PM

## 2020-09-13 NOTE — Evaluation (Signed)
Occupational Therapy Evaluation Patient Details Name: Jonathon [name] MRN: [id number] DOB: [birth year] Today's Date: 09/13/2020    History of Present Illness 80 y.o. male presenting to [facility] hospital on 3/31 with pain in buttocks radiating to RLE. MRI scan from October 2021 demonstrates a left-sided synovial cyst that causes lateral recess compression of the exiting L5 nerve root. Pt underwent lumbar laminotomy and foraminotomies partial facetectomy L4-5 left with excision of synovial cyst and decompression of L5 nerve root on 09/13/2020. PMH includes macular degeneration, neuropathy, DDD, anxiety, HLD.   Clinical Impression   PTA, pt was living with his wife and was independent. Currently, pt performing ADLs and functional mobility at Supervision level. Provided education and handout on back precautions, bed mobility, LB ADLs, toileting, and shower transfer; pt demonstrated understanding. Answered all pt questions. Recommend dc home once medically stable per physician. All acute OT needs met and will sign off. Thank you.    Follow Up Recommendations  No OT follow up    Equipment Recommendations  3 in 1 bedside commode    Recommendations for Other Services PT consult     Precautions / Restrictions Precautions Precautions: Back Precaution Booklet Issued: Yes (comment) Precaution Comments: Reviewing education on precautions. Pt only recalling "no twisting"; pt's wife able to recall all three precautions Required Braces or Orthoses:  (no brace needed) Restrictions Weight Bearing Restrictions: No      Mobility Bed Mobility Overal bed mobility: Needs Assistance Bed Mobility: Rolling;Sidelying to Sit Rolling: Supervision Sidelying to sit: Supervision       General bed mobility comments: Performing log roll technique with supervision    Transfers Overall transfer level: Needs assistance   Transfers: Sit to/from Stand Sit to Stand: Supervision              Balance                                            ADL either performed or assessed with clinical judgement   ADL Overall ADL's : Needs assistance/impaired                                       General ADL Comments: Pt performing ADLs at Supervision level. Providing education on back precautions, bed mobility, grooming, LB ADLs, toileting, and shower transfer. Pt demonstrating understanding with Min cues for precautions adherance     Vision Baseline Vision/History: Wears glasses Wears Glasses: At all times Patient Visual Report: No change from baseline       Perception     Praxis      Pertinent Vitals/Pain Pain Assessment: No/denies pain     Hand Dominance Right   Extremity/Trunk Assessment Upper Extremity Assessment Upper Extremity Assessment: Overall WFL for tasks assessed   Lower Extremity Assessment Lower Extremity Assessment: Defer to PT evaluation   Cervical / Trunk Assessment Cervical / Trunk Assessment: Other exceptions Cervical / Trunk Exceptions: incision clean, dry, intact   Communication Communication Communication: No difficulties   Cognition Arousal/Alertness: Awake/alert Behavior During Therapy: WFL for tasks assessed/performed Overall Cognitive Status: Within Functional Limits for tasks assessed Area of Impairment: Memory                     Memory: Decreased recall of precautions  General Comments: Forgetful of precautions. Feel this is his baselien cognition   General Comments  Wife present during session    Exercises     Shoulder Instructions      Home Living Family/patient expects to be discharged to:: Private residence Living Arrangements: Spouse/significant other Available Help at Discharge: Family;Available 24 hours/day Type of Home: House Home Access: Stairs to enter [employer] of Steps: 3 Entrance Stairs-Rails: Can reach both Home Layout: One level     Bathroom Shower/Tub:  Occupational psychologist: Standard     Home Equipment: Cane - single point          Prior Functioning/Environment Level of Independence: Independent        Comments: limited by pain recently but still mobilizing independently        OT Problem List: Impaired balance (sitting and/or standing);Decreased knowledge of use of DME or AE;Decreased knowledge of precautions;Decreased range of motion      OT Treatment/Interventions:      OT Goals(Current goals can be found in the care plan section) Acute Rehab OT Goals Patient Stated Goal: Go home today OT Goal Formulation: All assessment and education complete, DC therapy  OT Frequency:     Barriers to D/C:            Co-evaluation              AM-PAC OT "6 Clicks" Daily Activity     Outcome Measure Help from another person eating meals?: None Help from another person taking care of personal grooming?: None Help from another person toileting, which includes using toliet, bedpan, or urinal?: A Little Help from another person bathing (including washing, rinsing, drying)?: A Little Help from another person to put on and taking off regular upper body clothing?: None Help from another person to put on and taking off regular lower body clothing?: A Little 6 Click Score: 21   End of Session Nurse Communication: Mobility status  Activity Tolerance: Patient tolerated treatment well Patient left: in chair;with call bell/phone within reach  OT Visit Diagnosis: Unsteadiness on feet (R26.81);Other abnormalities of gait and mobility (R26.89);Muscle weakness (generalized) (M62.81)                Time: 1388-7195 OT Time Calculation (min): 17 min Charges:  OT General Charges $OT Visit: 1 Visit OT Evaluation $OT Eval Low Complexity: [place], OTR/L Acute Rehab Pager: [phone] Office: [place] 09/13/2020, 4:52 PM

## 2020-09-13 NOTE — Transfer of Care (Signed)
Immediate Anesthesia Transfer of Care Note  Patient: Jonathon [name]  Procedure(s) Performed: Left Lumbar four-five Laminectomy for facet/synovial cyst (Left Back)  Patient Location: PACU  Anesthesia Type:General  Level of Consciousness: awake and alert   Airway & Oxygen Therapy: Patient Spontanous Breathing and Patient connected to nasal cannula oxygen  Post-op Assessment: Report given to RN and Post -op Vital signs reviewed and stable  Post vital signs: Reviewed and stable  Last Vitals:  Vitals Value Taken Time  BP 124/72 09/13/20 1228  Temp    Pulse 84 09/13/20 1230  Resp 13 09/13/20 1230  SpO2 100 % 09/13/20 1230  Vitals shown include unvalidated device data.  Last Pain:  Vitals:   09/13/20 0901  TempSrc:   PainSc: 0-No pain      Patients Stated Pain Goal: 4 (09/13/20 0901)  Complications: No complications documented.

## 2020-09-13 NOTE — Op Note (Signed)
Date of surgery: 09/13/2020 Preoperative diagnosis: Spondylosis L4-L5 left with left lumbar radiculopathy secondary to synovial cyst Postoperative diagnosis: Same Procedure: Lumbar laminotomy and foraminotomies partial facetectomy L4-L5 left with excision of synovial cyst decompression of the L5 nerve root. Surgeon: Barnett Abu Anesthesia: General endotracheal Indications: Jonathon [name] is a 80 year old individual who has had severe left lumbar radicular pain and weakness in the left foot for a number of months now he has had trials of steroid injections and other conservative management without any success is now been advised regarding surgical extirpation of a rather large synovial cyst on the left side at L4-L5.  Procedure: Patient was brought to the operating room supine on the stretcher.  After the smooth induction of general tracheal anesthesia, he was carefully turned prone.  The back was prepped with alcohol DuraPrep and draped in a sterile fashion.  A needle placed at the level of L4-L5 was confirmed in this location with the radiograph.  A small vertical incision was created in this region and a dissection was carried down to the lumbodorsal fascia.  The fascia was opened on the left side and midline subperiosteal dissection revealed the presence of the L4-L5 interspace.  A second confirmatory x-ray was obtained.  Laminotomy was then created removing the inferior margin lamina of L4 out to and including a partial facetectomy.  The secondary Donielle ligament was taken up.  The underlying dura was identified and in this region there was noted to be a cystic outpouching from the ligamentous material that was densely adherent to the dura this was carefully dissected and using a 2 mm Kerrison punch this was removed in a piecemeal fashion contents were found to be a rather degenerated cyst of the synovium.  As the dissection continued we were able to dissect away the cystic attachments to the dura and  carefully preserve it.  Dissection was completed and ultimately a small curette was used to undermine an attachment on the undersurface of the superior articular process.  This provided good egress and opening for the L4 nerve root superiorly.  Inferiorly we then dissected any further attachments to the L5 nerve root and performed a small foraminotomies over the L5 nerve root.  The disc space was examined and noted to be intact.  Once the area was well decompressed hemostasis was achieved and the soft tissues and then a total of 20 cc of half percent Marcaine was injected into the paraspinous muscle and fascia and the lumbodorsal fascia was reapproximated with #1 Vicryl 2-0 Vicryl was used in the subcutaneous tissues 3-0 Vicryl in the upper subcuticular layer and 4-0 Vicryl for the final subcuticular closure Dermabond was placed on the skin.  Loss was less than when he cc

## 2020-09-14 ENCOUNTER — Encounter (HOSPITAL_COMMUNITY): Payer: Self-pay | Admitting: Neurological Surgery

## 2020-10-02 NOTE — Progress Notes (Signed)
Phone [phone] In person visit   Subjective:   Jonathon COMMISSO is a 80 y.o. year old very pleasant male patient who presents for/with See problem oriented charting Chief Complaint  Patient presents with  . Anxiety  . Hyperlipidemia  . Smoking   This visit occurred during the SARS-CoV-2 public health emergency.  Safety protocols were in place, including screening questions prior to the visit, additional usage of staff PPE, and extensive cleaning of exam room while observing appropriate contact time as indicated for disinfecting solutions.   Past Medical History-  Patient Active Problem List   Diagnosis Date Noted  . GAD (generalized anxiety disorder)     Priority: High  . Cigarette smoker     Priority: High  . OSA on CPAP 12/03/2017    Priority: Medium  . Hyperlipidemia 01/28/2017    Priority: Medium  . Pulmonary nodules 12/26/2016    Priority: Medium  . Aortic atherosclerosis (HCC) 10/28/2016    Priority: Medium  . Neuropathy     Priority: Medium  . DDD (degenerative disc disease), lumbar     Priority: Medium  . Genetic testing 10/14/2016    Priority: Low  . Family history of colon cancer 10/02/2016    Priority: Low  . Family history of breast cancer     Priority: Low  . Hilar lymphadenopathy 09/23/2016    Priority: Low  . Lymph nodes enlarged 09/23/2016    Priority: Low  . Family history of genetic disease 08/28/2016    Priority: Low  . Macular degeneration 07/21/2016    Priority: Low  . Lumbar radiculopathy, chronic 09/13/2020  . Degenerative lumbar spinal stenosis 04/06/2019  . Jock itch 07/31/2017    Medications- reviewed and updated Current Outpatient Medications  Medication Sig Dispense Refill  . ALPRAZolam (XANAX) 0.5 MG tablet TAKE 1 TABLET BY MOUTH TWICE A DAY AS NEEDED FOR ANXIETY (Patient taking differently: Take 0.5 mg by mouth 2 (two) times daily as needed for anxiety. TAKE 1 TABLET BY MOUTH TWICE A DAY AS NEEDED FOR ANXIETY) 37 tablet 4  .  aspirin EC 81 MG tablet Take 81 mg by mouth daily.    Marland Kitchen atorvastatin (LIPITOR) 20 MG tablet TAKE 1 TABLET BY MOUTH EVERY DAY (Patient taking differently: Take 20 mg by mouth daily.) 90 tablet 3  . cholecalciferol (VITAMIN D3) 25 MCG (1000 UNIT) tablet Take 1,000 Units by mouth daily.    Marland Kitchen escitalopram (LEXAPRO) 5 MG tablet Take 1 tablet (5 mg total) by mouth daily. 90 tablet 3  . gabapentin (NEURONTIN) 400 MG capsule 1 capsule midday and 2 capsules at bedtime (Patient taking differently: Take 400-800 mg by mouth See admin instructions. 1 capsule  (400 mg ) midday and 2 capsules  (800 mg) at bedtime) 270 capsule 5  . ketoconazole (NIZORAL) 2 % cream Apply 1 application topically daily. For jock itch up to 2 weeks (Patient taking differently: Apply 1 application topically daily as needed for irritation. For jock itch up to 2 weeks) 60 g 2  . meloxicam (MOBIC) 15 MG tablet TAKE 1 TABLET BY MOUTH EVERY DAY WITH EVENING MEAL (Patient taking differently: Take 15 mg by mouth every evening.) 90 tablet 3  . Multiple Vitamins-Minerals (PRESERVISION AREDS 2 PO) Take 2 tablets by mouth daily.    Marland Kitchen oxyCODONE-acetaminophen (PERCOCET/ROXICET) 5-325 MG tablet Take 1-2 tablets by mouth every 4 (four) hours as needed for moderate pain or severe pain. 30 tablet 0   No current facility-administered medications for this visit.  Objective:  BP 116/64   Pulse 66   Temp 98.1 F (36.7 C) (Temporal)   Ht 5\' 10"  (1.778 m)   Wt 210 lb 12.8 oz (95.6 kg)   SpO2 97%   BMI 30.25 kg/m  Gen: NAD, resting comfortably CV: RRR no murmurs rubs or gallops Lungs: CTAB no crackles, wheeze, rhonchi Abdomen: soft/nontender/nondistended/normal bowel sounds.  Ext: no edema Skin: warm, dry    Assessment and Plan   # GAD S: compliant with lexapro 5mg - some dry mouth but not as severe. Previously had dry mouth on zoloft 150mg  and cut to 50mg  and still had issues.  Patient also uses alprazolam with prescription written to  refill number 37/month- he is refilling approximately monthly as planned.  New prescription of oxycodone per Dr. discussed must space these medicines by at least 8 hours and no driving with either medication in his system A/P: reasonable control- continue current meds. Can refill meds up to 6 months  # Smoking S:smoking 8-10 cigs per day. Over 50 pack years.  Enrolled in lung cancer screening program-last done September 2020 A/P:  Patient overdue- refer back if he qualifies at age 31 almost 51- not sure he will -advised to quit smoking  # Hyperlipidemia/aortic atherosclerosis/CAD on CT scan S:compliant with atorvastatin 20mg . With smoking history we have opted to continue aspirin as well. Alternatively Lexapro ncreases bleeding risk- if any future bleeding issues would d/c aspirin Aortic atherosclerosis on prior imaging as well- reasonable for statin-prefer LDL under 70 Lab Results  Component Value Date   CHOL 119 09/29/2019   HDL 35.00 (L) 09/29/2019   LDLCALC 58 09/29/2019   LDLDIRECT 49 04/05/2020   TRIG 126.0 09/29/2019   CHOLHDL 3 09/29/2019   A/P: For hyperlipidemia-LDL has been at goal but due for lipid panel at this time-likely continue current medications - For aortic atherosclerosis continue risk factor modification - For CAD on CT scan no chest pain reported or shortness of breath but not very active-continue to monitor.  Continue aspirin  # Neuropathy  S:continued issues with left foot related to back. Uses gabapentin 400 at lunch and 800 before bed- finds this helpful  A/P:  Reasonable control-continue current medications. Some improvement after surgeyr  %-Degenerative disc disease lumbar spine- follows with Dr. 10/01/2019 is on Mobic and hydrocodone (in past) - Discussion with patient increase bleeding risk on meloxicam consider reduce dosing if possible- since doing better after surgery recommended he trial off meloxicam if possible - could try tylenol arthritis if  not using the oxycodone -Had a flareup of pain in early October 2020-we tried a course of prednisone -Referred to sports medicine-diagnosed as degenerative spinal stenosis -MRI done April 18, 2019 and plan  for epidural- he had this and has noted improvement - Dr. 10/01/2019 following again 2021 -Dr. Corwin Levins placed patient on oxycodone short-term after surgery for L4-L5 lumbar laminotomy and foraminotomies and partial facetectomy with excision of synovial cyst and decompression of L5 nerve root - patient states he has felt better since that time - has not used all of his oxycodone did have some pain when tried to wear his back brace- better without it    Recommended follow up: Return in about 6 months (around 04/05/2021) for physical or sooner if needed. Future Appointments  Date Time Provider Department Center  04/05/2021  3:15 PM [facility]   Lab/Order associations:  NOT fasting- coke this AM   ICD-10-CM   1. Hyperlipidemia, unspecified hyperlipidemia type  E78.5 CBC with Differential/Platelet    Comprehensive metabolic panel    Lipid panel  2. GAD (generalized anxiety disorder)  F41.1   3. Cigarette smoker  F17.210 Ambulatory Referral for Lung Cancer Scre  4. Neuropathy  G62.9   5. DDD (degenerative disc disease), lumbar  M51.36   6. Aortic atherosclerosis (HCC)  I70.0     No orders of the defined types were placed in this encounter.   Return precautions advised.  [name], MD

## 2020-10-02 NOTE — Patient Instructions (Addendum)
Health Maintenance Due  Topic Date Due  . COVID-19 Vaccine (3 - Booster for Moderna series) Patient will bring his card in after this visit and team will log 02/01/2020   - since doing better after surgery recommended he trial off meloxicam if possible - could try tylenol arthritis if not using the oxycodone if having some pain  Please stop by lab before you go If you have mychart- we will send your results within 3 business days of [place] receiving them.  If you do not have mychart- we will call you about results within 5 business days of [place] receiving them.  *please also note that you will see labs on mychart as soon as they post. I will later go in and write notes on them- will say "notes from Dr. Durene Cal"  Recommended follow up: Return in about 6 months (around 04/05/2021) for physical or sooner if needed.

## 2020-10-04 ENCOUNTER — Ambulatory Visit (INDEPENDENT_AMBULATORY_CARE_PROVIDER_SITE_OTHER): Payer: Medicare Other | Admitting: Family Medicine

## 2020-10-04 ENCOUNTER — Encounter: Payer: Self-pay | Admitting: Family Medicine

## 2020-10-04 ENCOUNTER — Other Ambulatory Visit: Payer: Self-pay

## 2020-10-04 VITALS — BP 116/64 | HR 66 | Temp 98.1°F | Ht 70.0 in | Wt 210.8 lb

## 2020-10-04 DIAGNOSIS — F17210 Nicotine dependence, cigarettes, uncomplicated: Secondary | ICD-10-CM | POA: Diagnosis not present

## 2020-10-04 DIAGNOSIS — E785 Hyperlipidemia, unspecified: Secondary | ICD-10-CM | POA: Diagnosis not present

## 2020-10-04 DIAGNOSIS — I700 Atherosclerosis of aorta: Secondary | ICD-10-CM

## 2020-10-04 DIAGNOSIS — F411 Generalized anxiety disorder: Secondary | ICD-10-CM

## 2020-10-04 DIAGNOSIS — M5136 Other intervertebral disc degeneration, lumbar region: Secondary | ICD-10-CM

## 2020-10-04 DIAGNOSIS — G629 Polyneuropathy, unspecified: Secondary | ICD-10-CM

## 2020-10-04 LAB — COMPREHENSIVE METABOLIC PANEL
ALT: 15 U/L (ref 0–53)
AST: 16 U/L (ref 0–37)
Albumin: 3.5 g/dL (ref 3.5–5.2)
Alkaline Phosphatase: 88 U/L (ref 39–117)
BUN: 14 mg/dL (ref 6–23)
CO2: 31 mEq/L (ref 19–32)
Calcium: 9 mg/dL (ref 8.4–10.5)
Chloride: 103 mEq/L (ref 96–112)
Creatinine, Ser: 0.93 mg/dL (ref 0.40–1.50)
GFR: 77.86 mL/min (ref >60.00)
Glucose, Bld: 104 mg/dL — ABNORMAL HIGH (ref 70–99)
Potassium: 3.9 mEq/L (ref 3.5–5.1)
Sodium: 140 mEq/L (ref 135–145)
Total Bilirubin: 0.6 mg/dL (ref 0.2–1.2)
Total Protein: 6.2 g/dL (ref 6.0–8.3)

## 2020-10-04 LAB — CBC WITH DIFFERENTIAL/PLATELET
Basophils Absolute: 0 10*3/uL (ref 0.0–0.1)
Basophils Relative: 0.7 % (ref 0.0–3.0)
Eosinophils Absolute: 0.4 10*3/uL (ref 0.0–0.7)
Eosinophils Relative: 10.9 % — ABNORMAL HIGH (ref 0.0–5.0)
HCT: 39.5 % (ref 39.0–52.0)
Hemoglobin: 13.2 g/dL (ref 13.0–17.0)
Lymphocytes Relative: 32.9 % (ref 12.0–46.0)
Lymphs Abs: 1.2 10*3/uL (ref 0.7–4.0)
MCHC: 33.5 g/dL (ref 30.0–36.0)
MCV: 96.3 fl (ref 78.0–100.0)
Monocytes Absolute: 0.4 10*3/uL (ref 0.1–1.0)
Monocytes Relative: 10.6 % (ref 3.0–12.0)
Neutro Abs: 1.7 10*3/uL (ref 1.4–7.7)
Neutrophils Relative %: 44.9 % (ref 43.0–77.0)
Platelets: 232 10*3/uL (ref 150.0–400.0)
RBC: 4.1 Mil/uL — ABNORMAL LOW (ref 4.22–5.81)
RDW: 13.9 % (ref 11.5–15.5)
WBC: 3.8 10*3/uL — ABNORMAL LOW (ref 4.0–10.5)

## 2020-10-04 LAB — LIPID PANEL
Cholesterol: 100 mg/dL (ref 0–200)
HDL: 37.7 mg/dL — ABNORMAL LOW (ref >39.00)
LDL Cholesterol: 47 mg/dL (ref 0–99)
NonHDL: 62.15
Total CHOL/HDL Ratio: 3
Triglycerides: 78 mg/dL (ref 0.0–149.0)
VLDL: 15.6 mg/dL (ref 0.0–40.0)

## 2020-10-11 ENCOUNTER — Other Ambulatory Visit: Payer: Self-pay | Admitting: *Deleted

## 2020-10-11 DIAGNOSIS — F17210 Nicotine dependence, cigarettes, uncomplicated: Secondary | ICD-10-CM

## 2020-10-11 DIAGNOSIS — Z87891 Personal history of nicotine dependence: Secondary | ICD-10-CM

## 2020-10-12 ENCOUNTER — Other Ambulatory Visit: Payer: Self-pay | Admitting: Radiology

## 2020-10-12 MED ORDER — MELOXICAM 15 MG PO TABS: ORAL_TABLET | ORAL | 3 refills | Status: DC

## 2020-10-25 ENCOUNTER — Other Ambulatory Visit: Payer: Self-pay | Admitting: Family Medicine

## 2020-11-05 ENCOUNTER — Ambulatory Visit
Admission: RE | Admit: 2020-11-05 | Discharge: 2020-11-05 | Disposition: A | Discharge status: Home / Self Care | Payer: Medicare Other | Source: Ambulatory Visit | Attending: Acute Care | Admitting: Acute Care

## 2020-11-05 ENCOUNTER — Other Ambulatory Visit: Payer: Self-pay

## 2020-11-05 DIAGNOSIS — F17210 Nicotine dependence, cigarettes, uncomplicated: Secondary | ICD-10-CM | POA: Diagnosis not present

## 2020-11-05 DIAGNOSIS — Z87891 Personal history of nicotine dependence: Secondary | ICD-10-CM

## 2020-11-22 NOTE — Progress Notes (Signed)
Please call patient and let them  know their  low dose Ct was read as a Lung RADS 2: nodules that are benign in appearance and behavior with a very low likelihood of becoming a clinically active cancer due to size or lack of growth. Recommendation per radiology is for a repeat LDCT in 12 months. .Please let them  know we will order and schedule their  annual screening scan for 10/2021. Please let them  know there was notation of CAD on their  scan.  Please remind the patient  that this is a non-gated exam therefore degree or severity of disease  cannot be determined. Please have them  follow up with their PCP regarding potential risk factor modification, dietary therapy or pharmacologic therapy if clinically indicated. Pt.  is currently currently on statin therapy. Please place order for annual  screening scan for  10/2021 and fax results to PCP. Thanks so much.  + for CAD. Make sure he is following up with PCP. Thanks so much

## 2020-12-03 ENCOUNTER — Encounter: Payer: Self-pay | Admitting: Family Medicine

## 2020-12-03 DIAGNOSIS — J439 Emphysema, unspecified: Secondary | ICD-10-CM | POA: Insufficient documentation

## 2020-12-07 ENCOUNTER — Other Ambulatory Visit: Payer: Self-pay | Admitting: Family Medicine

## 2021-03-20 ENCOUNTER — Encounter: Payer: Self-pay | Admitting: Pulmonary Disease

## 2021-03-20 ENCOUNTER — Ambulatory Visit: Payer: Medicare Other | Admitting: Pulmonary Disease

## 2021-03-20 ENCOUNTER — Other Ambulatory Visit: Payer: Self-pay

## 2021-03-20 VITALS — BP 114/78 | HR 78 | Temp 97.7°F | Ht 69.0 in | Wt 212.8 lb

## 2021-03-20 DIAGNOSIS — Z23 Encounter for immunization: Secondary | ICD-10-CM | POA: Diagnosis not present

## 2021-03-20 DIAGNOSIS — R918 Other nonspecific abnormal finding of lung field: Secondary | ICD-10-CM

## 2021-03-20 DIAGNOSIS — F17210 Nicotine dependence, cigarettes, uncomplicated: Secondary | ICD-10-CM | POA: Diagnosis not present

## 2021-03-20 DIAGNOSIS — J432 Centrilobular emphysema: Secondary | ICD-10-CM

## 2021-03-20 NOTE — Assessment & Plan Note (Signed)
We reviewed CT chest and nodules appear to be stable.  Can stop screening CT at this point

## 2021-03-20 NOTE — Progress Notes (Signed)
   Subjective:    Patient ID: Jonathon [name], male    DOB: 01-Dec-1940, 80 y.o.   MRN: [id number]  HPI  74 y o smoker  for follow-up of pulmonary nodules noted 2018   Imaging showed pulmonary nodules and calcified lymphadenopathy with calcified granulomas in the liver and spleen -suggestive of old granulomatous disease He started smoking as a 80 year old, more than 50 pack years   Continues to smoke half pack per day.  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 Denies dyspnea, chest pain, weight loss  Significant tests/ events reviewed Screening CT chest on 09/10/16 >>scattered pulmonary nodules measuring up to 16 mm the subpleural right lower lobe,  another 8 mm subpleural nodule in the superior  segment left lower lobe- RADS  4 B.   09/2016 PET scan -minimal metabolic activity in the right subpleural nodule indicating benign etiology.  hypermetabolic right hilar lymph node-this lymph node was not noted on the earlier noncontrast CT. There was diffuse activity throughout the esophagus favoring esophagitis   09/2016 Spirometry- no  evidence of airway obstruction with a ratio of 99 an FEV1 of 72% and FVC of 52% suggesting moderate restriction   08/2017  CT scan - lung nodules are either stable or decreased in size, most concerning was pleural based nodule in the periphery of right lower lobe -but even this is marginally decreased. There were calcified granulomas in the liver and spleen Right hilar lymphadenopathy stable but at 9 mm and left hilar lymph nodes appear calcified   CT chest 02/2019  Stable 15 mm pleural-based nodule in lateral right lower lobe  LDCT 10/2020 mild emphysema, stable nodules, largest right lower lobe 16 mm stable  Review of Systems neg for any significant sore throat, dysphagia, itching, sneezing, nasal congestion or excess/ purulent secretions, fever, chills, sweats, unintended wt loss, pleuritic or exertional cp, hempoptysis, orthopnea pnd or change in  chronic leg swelling. Also denies presyncope, palpitations, heartburn, abdominal pain, nausea, vomiting, diarrhea or change in bowel or urinary habits, dysuria,hematuria, rash, arthralgias, visual complaints, headache, numbness weakness or ataxia.     Objective:   Physical Exam  Gen. Pleasant, well-nourished, early in no distress ENT - no thrush, no pallor/icterus,no post nasal drip Neck: No JVD, no thyromegaly, no carotid bruits Lungs: no use of accessory muscles, no dullness to percussion, decreased breath sounds bilateral without rales or rhonchi  Cardiovascular: Rhythm regular, heart sounds  normal, no murmurs or gallops, no peripheral edema Musculoskeletal: No deformities, no cyanosis or clubbing        Assessment & Plan:

## 2021-03-20 NOTE — Assessment & Plan Note (Addendum)
We reviewed PFTs and I offered him bronchodilator therapy.  He is relatively asymptomatic and would like to avoid. Flu shot today We discussed signs and symptoms of exacerbation and he will call

## 2021-03-20 NOTE — Assessment & Plan Note (Signed)
Smoking cessation emphasized is the most important intervention that would add years to his life 

## 2021-03-20 NOTE — Patient Instructions (Signed)
FLu shot today

## 2021-04-05 ENCOUNTER — Ambulatory Visit (INDEPENDENT_AMBULATORY_CARE_PROVIDER_SITE_OTHER): Payer: Medicare Other

## 2021-04-05 VITALS — BP 130/76 | HR 63 | Temp 97.6°F | Wt 213.8 lb

## 2021-04-05 DIAGNOSIS — Z0000 Encounter for general adult medical examination without abnormal findings: Secondary | ICD-10-CM

## 2021-04-05 NOTE — Patient Instructions (Signed)
Jonathon [name] , Thank you for taking time to come for your Medicare Wellness Visit. I appreciate your ongoing commitment to your health goals. Please review the following plan we discussed and let me know if I can assist you in the future.   Screening recommendations/referrals: Colonoscopy: No longer required  Recommended yearly ophthalmology/optometry visit for glaucoma screening and checkup Recommended yearly dental visit for hygiene and checkup  Vaccinations: Influenza vaccine: Done 03/20/21 repeat every year  Pneumococcal vaccine: Up to date Tdap vaccine: Done 03/21/19 repeat every 10 years  Shingles vaccine: Completed 2/25 & 11/19/20   Covid-19: Completed 1/21, 08/04/19 & 07/04/20, 12/14/20  Advanced directives: Please bring a copy of your health care power of attorney and living will to the office at your convenience.  Conditions/risks identified: None at this time   Next appointment: Follow up in one year for your annual wellness visit.   Preventive Care 40 Years and Older, Male Preventive care refers to lifestyle choices and visits with your health care provider that can promote health and wellness. What does preventive care include? A yearly physical exam. This is also called an annual well check. Dental exams once or twice a year. Routine eye exams. Ask your health care provider how often you should have your eyes checked. Personal lifestyle choices, including: Daily care of your teeth and gums. Regular physical activity. Eating a healthy diet. Avoiding tobacco and drug use. Limiting alcohol use. Practicing safe sex. Taking low doses of aspirin every day. Taking vitamin and mineral supplements as recommended by your health care provider. What happens during an annual well check? The services and screenings done by your health care provider during your annual well check will depend on your age, overall health, lifestyle risk factors, and family history of disease. Counseling   Your health care provider may ask you questions about your: Alcohol use. Tobacco use. Drug use. Emotional well-being. Home and relationship well-being. Sexual activity. Eating habits. History of falls. Memory and ability to understand (cognition). Work and work Astronomer. Screening  You may have the following tests or measurements: Height, weight, and BMI. Blood pressure. Lipid and cholesterol levels. These may be checked every 5 years, or more frequently if you are over 39 years old. Skin check. Lung cancer screening. You may have this screening every year starting at age 44 if you have a 30-pack-year history of smoking and currently smoke or have quit within the past 15 years. Fecal occult blood test (FOBT) of the stool. You may have this test every year starting at age 55. Flexible sigmoidoscopy or colonoscopy. You may have a sigmoidoscopy every 5 years or a colonoscopy every 10 years starting at age 71. Prostate cancer screening. Recommendations will vary depending on your family history and other risks. Hepatitis C blood test. Hepatitis B blood test. Sexually transmitted disease (STD) testing. Diabetes screening. This is done by checking your blood sugar (glucose) after you have not eaten for a while (fasting). You may have this done every 1-3 years. Abdominal aortic aneurysm (AAA) screening. You may need this if you are a current or former smoker. Osteoporosis. You may be screened starting at age 34 if you are at high risk. Talk with your health care provider about your test results, treatment options, and if necessary, the need for more tests. Vaccines  Your health care provider may recommend certain vaccines, such as: Influenza vaccine. This is recommended every year. Tetanus, diphtheria, and acellular pertussis (Tdap, Td) vaccine. You may need a Td booster  every 10 years. Zoster vaccine. You may need this after age 51. Pneumococcal 13-valent conjugate (PCV13) vaccine.  One dose is recommended after age 4. Pneumococcal polysaccharide (PPSV23) vaccine. One dose is recommended after age 58. Talk to your health care provider about which screenings and vaccines you need and how often you need them. This information is not intended to replace advice given to you by your health care provider. Make sure you discuss any questions you have with your health care provider. Document Released: 06/29/2015 Document Revised: 02/20/2016 Document Reviewed: 04/03/2015 Elsevier Interactive Patient Education  2017 [employer] Prevention in the Home Falls can cause injuries. They can happen to people of all ages. There are many things you can do to make your home safe and to help prevent falls. What can I do on the outside of my home? Regularly fix the edges of walkways and driveways and fix any cracks. Remove anything that might make you trip as you walk through a door, such as a raised step or threshold. Trim any bushes or trees on the path to your home. Use bright outdoor lighting. Clear any walking paths of anything that might make someone trip, such as rocks or tools. Regularly check to see if handrails are loose or broken. Make sure that both sides of any steps have handrails. Any raised decks and porches should have guardrails on the edges. Have any leaves, snow, or ice cleared regularly. Use sand or salt on walking paths during winter. Clean up any spills in your garage right away. This includes oil or grease spills. What can I do in the bathroom? Use night lights. Install grab bars by the toilet and in the tub and shower. Do not use towel bars as grab bars. Use non-skid mats or decals in the tub or shower. If you need to sit down in the shower, use a plastic, non-slip stool. Keep the floor dry. Clean up any water that spills on the floor as soon as it happens. Remove soap buildup in the tub or shower regularly. Attach bath mats securely with double-sided  non-slip rug tape. Do not have throw rugs and other things on the floor that can make you trip. What can I do in the bedroom? Use night lights. Make sure that you have a light by your bed that is easy to reach. Do not use any sheets or blankets that are too big for your bed. They should not hang down onto the floor. Have a firm chair that has side arms. You can use this for support while you get dressed. Do not have throw rugs and other things on the floor that can make you trip. What can I do in the kitchen? Clean up any spills right away. Avoid walking on wet floors. Keep items that you use a lot in easy-to-reach places. If you need to reach something above you, use a strong step stool that has a grab bar. Keep electrical cords out of the way. Do not use floor polish or wax that makes floors slippery. If you must use wax, use non-skid floor wax. Do not have throw rugs and other things on the floor that can make you trip. What can I do with my stairs? Do not leave any items on the stairs. Make sure that there are handrails on both sides of the stairs and use them. Fix handrails that are broken or loose. Make sure that handrails are as long as the stairways. Check any carpeting to  make sure that it is firmly attached to the stairs. Fix any carpet that is loose or worn. Avoid having throw rugs at the top or bottom of the stairs. If you do have throw rugs, attach them to the floor with carpet tape. Make sure that you have a light switch at the top of the stairs and the bottom of the stairs. If you do not have them, ask someone to add them for you. What else can I do to help prevent falls? Wear shoes that: Do not have high heels. Have rubber bottoms. Are comfortable and fit you well. Are closed at the toe. Do not wear sandals. If you use a stepladder: Make sure that it is fully opened. Do not climb a closed stepladder. Make sure that both sides of the stepladder are locked into place. Ask  someone to hold it for you, if possible. Clearly mark and make sure that you can see: Any grab bars or handrails. First and last steps. Where the edge of each step is. Use tools that help you move around (mobility aids) if they are needed. These include: Canes. Walkers. Scooters. Crutches. Turn on the lights when you go into a dark area. Replace any light bulbs as soon as they burn out. Set up your furniture so you have a clear path. Avoid moving your furniture around. If any of your floors are uneven, fix them. If there are any pets around you, be aware of where they are. Review your medicines with your doctor. Some medicines can make you feel dizzy. This can increase your chance of falling. Ask your doctor what other things that you can do to help prevent falls. This information is not intended to replace advice given to you by your health care provider. Make sure you discuss any questions you have with your health care provider. Document Released: 03/29/2009 Document Revised: 11/08/2015 Document Reviewed: 07/07/2014 Elsevier Interactive Patient Education  2017 [employer].

## 2021-04-05 NOTE — Progress Notes (Addendum)
Subjective:   Jonathon [name] is a 80 y.o. male who presents for Medicare Annual/Subsequent preventive examination.  Review of Systems     Cardiac Risk Factors include: dyslipidemia;male gender;obesity (BMI >30kg/m2);smoking/ tobacco exposure     Objective:    Today's Vitals   04/05/21 1509  BP: 130/76  Pulse: 63  Temp: 97.6 F (36.4 C)  SpO2: 98%  Weight: 213 lb 12.8 oz (97 kg)   Body mass index is 31.57 kg/m.  Advanced Directives 04/05/2021 09/13/2020 03/30/2020 03/16/2019 12/03/2017 10/28/2016 10/28/2016  Does Patient Have a Medical Advance Directive? Yes No No Yes Yes (No Data) No  Type of Advance Directive [place]  Does patient want to make changes to medical advance directive? - - - No - Patient declined - - -  Copy of [place] in Chart? No - copy requested - - No - copy requested - - -  Would patient like information on creating a medical advance directive? - No - Patient declined (No Data) - - - -    Current Medications (verified) Outpatient Encounter Medications as of 04/05/2021  Medication Sig   ALPRAZolam (XANAX) 0.5 MG tablet TAKE 1 TABLET BY MOUTH TWICE A DAY AS NEEDED FOR ANXIETY   aspirin EC 81 MG tablet Take 81 mg by mouth daily.   atorvastatin (LIPITOR) 20 MG tablet TAKE 1 TABLET BY MOUTH EVERY DAY (Patient taking differently: Take 20 mg by mouth daily.)   cholecalciferol (VITAMIN D3) 25 MCG (1000 UNIT) tablet Take 1,000 Units by mouth daily.   escitalopram (LEXAPRO) 5 MG tablet Take 1 tablet (5 mg total) by mouth daily.   gabapentin (NEURONTIN) 400 MG capsule TAKE 1 CAPSULE MIDDAY AND 2 CAPSULES AT BEDTIME.   ketoconazole (NIZORAL) 2 % cream Apply 1 application topically daily. For jock itch up to 2 weeks (Patient taking differently: Apply 1 application topically daily as needed for irritation. For jock itch up to 2 weeks)   meloxicam (MOBIC) 15 MG tablet TAKE 1 TABLET  BY MOUTH EVERY DAY WITH EVENING MEAL   Multiple Vitamins-Minerals (PRESERVISION AREDS 2 PO) Take 2 tablets by mouth daily.   DENTA 5000 PLUS 1.1 % CREA dental cream Take by mouth as directed.   [DISCONTINUED] oxyCODONE-acetaminophen (PERCOCET/ROXICET) 5-325 MG tablet Take 1-2 tablets by mouth every 4 (four) hours as needed for moderate pain or severe pain. (Patient not taking: No sig reported)   No facility-administered encounter medications on file as of 04/05/2021.    Allergies (verified) Penicillins   History: Past Medical History:  Diagnosis Date   Anxiety    never tried on SSRI. xanax 0.81m BID through Dr. BTollie Pizza    DDD (degenerative disc disease), lumbar    lumbar DDD- follows with orthopedist Dr. NLinus Ornortho. mobic 136mdaily and hydrocodone 5/325 prn   Family history of breast cancer    HLD (hyperlipidemia)    Macular degeneration 07/21/2016   Follows with optho- areds2. He states he is unsure of diagnosis.    Neuropathy    unknown cause ? back related(sees ortho for back). burning sensation in feet. gabapentin 10057mfter dinner, 2 before bed   Smoker    8-10 cigarettes a day. plus nicorette. 60 years. over 50 pack years   Venous stasis     in the past- Wound center on legs. Elevate. Compression stockings.    Past Surgical History:  Procedure Laterality Date  ESOPHAGOGASTRODUODENOSCOPY Left 05/28/2016    Surgeon: Juanita Craver, MD; food stuck in esophagus   LUMBAR LAMINECTOMY/DECOMPRESSION MICRODISCECTOMY Left 09/13/2020   Procedure: Left Lumbar four-five Laminectomy for facet/synovial cyst;  Surgeon: Kristeen Miss, MD;  Location: [facility];  Service: Neurosurgery;  Laterality: Left;   Family History  Problem Relation Age of Onset   Other Mother        "old age" per patient    Dementia Mother    Alcohol abuse Father        contributed   Cancer Sister        unknown type   Alcohol abuse Brother    Breast cancer Sister        dx in her early 48s   Other  Sister        PALB2+   Breast cancer Maternal Aunt    Colon cancer Maternal Uncle    Breast cancer Other 27   Social History   Socioeconomic History   Marital status: Married    Spouse name: Not on file   Number of children: Not on file   Years of education: Not on file   Highest education level: Not on file  Occupational History   Occupation: semi retired  Tobacco Use   Smoking status: Every Day    Packs/day: 0.50    Years: 62.00    Pack years: 31.00    Types: Cigarettes    Start date: 1958   Smokeless tobacco: Never   Tobacco comments:    02/10/19- .5 pack   Vaping Use   Vaping Use: Never used  Substance and Sexual Activity   Alcohol use: Not Currently    Comment: rare drinking occasionally   Drug use: No   Sexual activity: Not on file  Other Topics Concern   Not on file  Social History Narrative   Married around 1964. 3 children. 6 grandkids.       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, fast food products on the side   Social Determinants of Radio broadcast assistant Strain: Low Risk    Difficulty of Paying Living Expenses: Not hard at all  Food Insecurity: No Food Insecurity   Worried About Charity fundraiser in the Last Year: Never true   Arboriculturist in the Last Year: Never true  Transportation Needs: No Transportation Needs   Lack of Transportation (Medical): No   Lack of Transportation (Non-Medical): No  Physical Activity: Inactive   Days of Exercise per Week: 0 days   Minutes of Exercise per Session: 0 min  Stress: No Stress Concern Present   Feeling of Stress : Not at all  Social Connections: Moderately Isolated   Frequency of Communication with Friends and Family: More than three times a week   Frequency of Social Gatherings with Friends and Family: More than three times a week   Attends Religious Services: Never   Marine scientist or Organizations: No   Attends Archivist Meetings: Never    Marital Status: Married    Tobacco Counseling Ready to quit: Not Answered Counseling given: Not Answered Tobacco comments: 02/10/19- .5 pack    Clinical Intake:  Pre-visit preparation completed: Yes  Pain : No/denies pain     BMI - recorded: 31.57 Nutritional Status: BMI > 30  Obese Nutritional Risks: None Diabetes: No  How often do you need to have someone help you when you read instructions, pamphlets, or other written materials from your doctor  or pharmacy?: 1 - Never  Diabetic?No  Interpreter Needed?: No  Information entered by :: Charlott Rakes, LPN   Activities of Daily Living In your present state of health, do you have any difficulty performing the following activities: 04/05/2021 09/13/2020  Hearing? N -  Vision? N -  Difficulty concentrating or making decisions? N -  Walking or climbing stairs? N -  Dressing or bathing? N -  Doing errands, shopping? N N  Preparing Food and eating ? N -  Using the Toilet? N -  In the past six months, have you accidently leaked urine? N -  Do you have problems with loss of bowel control? N -  Managing your Medications? N -  Managing your Finances? N -  Housekeeping or managing your Housekeeping? N -  Some recent data might be hidden    Patient Care Team: Marin Olp, MD as PCP - General (Family Medicine) Rigoberto Noel, MD as Consulting Physician (Pulmonary Disease) Monna Fam, MD as Consulting Physician (Ophthalmology) Juanita Craver, MD as Consulting Physician (Gastroenterology) [facility] as Consulting Physician  Indicate any recent Medical Services you may have received from other than [facility] providers in the past year (date may be approximate).     Assessment:   This is a routine wellness examination for [place].  Hearing/Vision screen Hearing Screening - Comments:: Pt denies any hearing  Vision Screening - Comments:: Pt follows up with Dr Herbert Deaner for annual eye exams   Dietary issues and  exercise activities discussed: Current Exercise Habits: The patient does not participate in regular exercise at present   Goals Addressed             This Visit's Progress    Patient Stated       None at this time        Depression Screen PHQ 2/9 Scores 04/05/2021 10/04/2020 03/30/2020 09/29/2019 05/30/2019 04/29/2019 03/21/2019  PHQ - 2 Score 0 0 0 0 0 0 0  PHQ- 9 Score - 0 - 1 0 2 0    Fall Risk Fall Risk  04/05/2021 10/04/2020 03/30/2020 05/30/2019 04/29/2019  Falls in the past year? 0 0 0 0 0  Number falls in past yr: 0 0 0 0 0  Injury with Fall? 0 0 0 0 0  Risk for fall due to : Impaired vision - Impaired balance/gait;Impaired mobility - -  Follow up Falls prevention discussed - Falls prevention discussed - -    FALL RISK PREVENTION PERTAINING TO THE HOME:  Any stairs in or around the home? Yes  If so, are there any without handrails? No  Home free of loose throw rugs in walkways, pet beds, electrical cords, etc? Yes  Adequate lighting in your home to reduce risk of falls? Yes   ASSISTIVE DEVICES UTILIZED TO PREVENT FALLS:  Life alert? No  Use of a cane, walker or w/c? No  Grab bars in the bathroom? Yes  Shower chair or bench in shower? No  Elevated toilet seat or a handicapped toilet? No   TIMED UP AND GO:  Was the test performed? No .   Cognitive Function: MMSE - Mini Mental State Exam 12/03/2017  Not completed: (No Data)     6CIT Screen 04/05/2021 03/30/2020 03/16/2019 10/28/2016  What Year? 0 points 0 points 0 points 0 points  What month? 0 points 0 points 0 points 0 points  What time? 0 points - 0 points 0 points  Count back from 20 0 points 0  points 0 points 0 points  Months in reverse 4 points 4 points 0 points 0 points  Repeat phrase 0 points 0 points 2 points 6 points  Total Score 4 - 2 6    Immunizations Immunization History  Administered Date(s) Administered   Fluad Quad(high Dose 65+) 02/10/2019, 03/01/2020, 03/20/2021   Influenza Split  03/16/2014, 03/17/2015, 02/15/2016   Influenza, High Dose Seasonal PF 05/18/2015, 03/18/2016, 02/14/2017, 03/26/2017, 04/16/2018, 06/04/2018   Influenza-Unspecified 04/16/2012, 05/18/2015, 03/18/2016, 04/30/2016, 03/17/2019, 03/16/2020   Moderna Sars-Covid-2 Vaccination 07/07/2019, 08/04/2019, 07/04/2020, 12/14/2020   Pneumococcal Conjugate-13 01/31/2014   Pneumococcal Polysaccharide-23 12/03/2017   Pneumococcal-Unspecified 03/17/2011   Tdap 03/21/2019   Zoster Recombinat (Shingrix) 08/10/2020, 11/19/2020   Zoster, Live 07/06/2013    TDAP status: Up to date  Flu Vaccine status: Up to date  Pneumococcal vaccine status: Up to date  Covid-19 vaccine status: Completed vaccines  Qualifies for Shingles Vaccine? Yes   Zostavax completed Yes   Shingrix Completed?: Yes  Screening Tests Health Maintenance  Topic Date Due   COVID-19 Vaccine (5 - Booster for Moderna series) 02/08/2021   TETANUS/TDAP  03/20/2029   Pneumonia Vaccine 29+ Years old  Completed   INFLUENZA VACCINE  Completed   Zoster Vaccines- Shingrix  Completed   HPV VACCINES  Aged Out    Health Maintenance  Health Maintenance Due  Topic Date Due   COVID-19 Vaccine (5 - Booster for Moderna series) 02/08/2021    Colorectal cancer screening: No longer required.    Additional Screening:    Vision Screening: Recommended annual ophthalmology exams for early detection of glaucoma and other disorders of the eye. Is the patient up to date with their annual eye exam?  Yes  Who is the provider or what is the name of the office in which the patient attends annual eye exams? Dr Herbert Deaner  If pt is not established with a provider, would they like to be referred to a provider to establish care? No .   Dental Screening: Recommended annual dental exams for proper oral hygiene  Community Resource Referral / Chronic Care Management: CRR required this visit?  No   CCM required this visit?  No      Plan:     I have  personally reviewed and noted the following in the patient's chart:   Medical and social history Use of alcohol, tobacco or illicit drugs  Current medications and supplements including opioid prescriptions. Patient is not currently taking opioid prescriptions. Functional ability and status Nutritional status Physical activity Advanced directives List of other physicians Hospitalizations, surgeries, and ER visits in previous 12 months Vitals Screenings to include cognitive, depression, and falls Referrals and appointments  In addition, I have reviewed and discussed with patient certain preventive protocols, quality metrics, and best practice recommendations. A written personalized care plan for preventive services as well as general preventive health recommendations were provided to patient.     Willette Brace, LPN   83/[birth year]1916   Nurse Notes: None

## 2021-05-24 ENCOUNTER — Other Ambulatory Visit: Payer: Self-pay | Admitting: Family Medicine

## 2021-06-27 DIAGNOSIS — H35033 Hypertensive retinopathy, bilateral: Secondary | ICD-10-CM | POA: Diagnosis not present

## 2021-06-27 DIAGNOSIS — H2513 Age-related nuclear cataract, bilateral: Secondary | ICD-10-CM | POA: Diagnosis not present

## 2021-06-27 DIAGNOSIS — H353132 Nonexudative age-related macular degeneration, bilateral, intermediate dry stage: Secondary | ICD-10-CM | POA: Diagnosis not present

## 2021-06-27 DIAGNOSIS — H35373 Puckering of macula, bilateral: Secondary | ICD-10-CM | POA: Diagnosis not present

## 2021-06-27 LAB — HM DIABETES EYE EXAM

## 2021-07-29 ENCOUNTER — Other Ambulatory Visit: Payer: Self-pay | Admitting: Family Medicine

## 2021-07-29 ENCOUNTER — Other Ambulatory Visit: Payer: Self-pay | Admitting: Specialist

## 2021-11-14 ENCOUNTER — Other Ambulatory Visit: Payer: Self-pay | Admitting: Family Medicine

## 2021-12-16 ENCOUNTER — Ambulatory Visit (INDEPENDENT_AMBULATORY_CARE_PROVIDER_SITE_OTHER): Payer: Medicare Other | Admitting: Family

## 2021-12-16 ENCOUNTER — Encounter: Payer: Self-pay | Admitting: Family

## 2021-12-16 VITALS — BP 132/79 | HR 66 | Temp 98.4°F | Ht 69.0 in | Wt 207.1 lb

## 2021-12-16 DIAGNOSIS — K5909 Other constipation: Secondary | ICD-10-CM

## 2021-12-16 MED ORDER — BISACODYL 5 MG PO TBEC: 10.0000 mg | DELAYED_RELEASE_TABLET | ORAL | 0 refills | Status: DC

## 2021-12-16 MED ORDER — POLYETHYLENE GLYCOL 3350 17 GM/SCOOP PO POWD: 17.0000 g | Freq: Every day | ORAL | 1 refills | Status: DC

## 2021-12-16 NOTE — Patient Instructions (Addendum)
It was very nice to see you today!   For your constipation:  Take over the counter liquid Magnesium citrate, start with 1/2 bottle, if no BM by next day, then finish the rest of the bottle.   I will send Dulcolax pills in case they do not have the Magnesium citrate.  But, DO NOT TAKE BOTH!  Once you have a big, soft BM, the following day start generic Miralax, 1 scoop daily. I will send this to your pharmacy as well.  Must drink 2 liters of water every day to help soften your bowel and move things along!      PLEASE NOTE:  If you had any lab tests please let us know if you have not heard back within a few days. You may see your results on MyChart before we have a chance to review them but we will give you a call once they are reviewed by [place]. If we ordered any referrals today, please let us know if you have not heard from their office within the next week.

## 2021-12-16 NOTE — Progress Notes (Signed)
Subjective:     Patient ID: Jonathon [name], male    DOB: [birth year], 81 y.o.   MRN: [id number]  Chief Complaint  Patient presents with   Abdominal Pain    Pt c/o intermittent stomach pains mostly in the afternoon. Present for 1 week and a half. Pt states on Thursday 6/29 , he took Miralax and did not have the pain from 6/30- 7/2. But pain started back on today. Achy pains   HPI: Abdominal pain:  started about 10 days ago, reports having after lunch about an hour or so after, pain in midline, above naval, also reports gas bubbles in upper belly, last BM on Saturday.    Assessment & Plan:   Problem List Items Addressed This Visit   None Visit Diagnoses     Other constipation    -  Primary Advised pt to get OTC liquid Magnesium citrate, start with 1/2 bottle, if no BM by next day, then finish the rest of the bottle. Also sent Bisacodyl in case they do not have the Magnesium citrate. After having a good, soft BM, advised on need for daily bowel program with either daily fiber supplement & stool softener, or Miralax which he has used before.    Relevant Medications   polyethylene glycol powder (GLYCOLAX/MIRALAX) 17 GM/SCOOP powder   bisacodyl 5 MG EC tablet      Outpatient Medications Prior to Visit  Medication Sig Dispense Refill   ALPRAZolam (XANAX) 0.5 MG tablet TAKE 1 TABLET BY MOUTH TWICE A DAY AS NEEDED FOR ANXIETY 37 tablet 4   aspirin EC 81 MG tablet Take 81 mg by mouth daily.     atorvastatin (LIPITOR) 20 MG tablet TAKE 1 TABLET BY MOUTH EVERY DAY 90 tablet 3   cholecalciferol (VITAMIN D3) 25 MCG (1000 UNIT) tablet Take 1,000 Units by mouth daily.     DENTA 5000 PLUS 1.1 % CREA dental cream Take by mouth as directed.     escitalopram (LEXAPRO) 5 MG tablet TAKE 1 TABLET (5 MG TOTAL) BY MOUTH DAILY. 90 tablet 3   gabapentin (NEURONTIN) 400 MG capsule TAKE 1 CAPSULE MIDDAY AND 2 CAPSULES AT BEDTIME. 270 capsule 5   ketoconazole (NIZORAL) 2 % cream Apply 1 application  topically daily. For jock itch up to 2 weeks (Patient taking differently: Apply 1 application  topically daily as needed for irritation. For jock itch up to 2 weeks) 60 g 2   meloxicam (MOBIC) 15 MG tablet TAKE 1 TABLET BY MOUTH EVERY DAY WITH EVENING MEAL 90 tablet 3   Multiple Vitamins-Minerals (PRESERVISION AREDS 2 PO) Take 2 tablets by mouth daily.     No facility-administered medications prior to visit.    Past Medical History:  Diagnosis Date   Anxiety    never tried on SSRI. xanax 0.5mg  BID through Dr. Doristine Counter.    DDD (degenerative disc disease), lumbar    lumbar DDD- follows with orthopedist Dr. Leron Croak ortho. mobic 15mg  daily and hydrocodone 5/325 prn   Family history of breast cancer    HLD (hyperlipidemia)    Macular degeneration 07/21/2016   Follows with optho- areds2. He states he is unsure of diagnosis.    Neuropathy    unknown cause ? back related(sees ortho for back). burning sensation in feet. gabapentin 100mg  after dinner, 2 before bed   Smoker    8-10 cigarettes a day. plus nicorette. 60 years. over 50 pack years   Venous stasis     in the past-  Wound center on legs. Elevate. Compression stockings.     Past Surgical History:  Procedure Laterality Date   ESOPHAGOGASTRODUODENOSCOPY Left 05/28/2016    Surgeon: Charna Elizabeth, MD; food stuck in esophagus   LUMBAR LAMINECTOMY/DECOMPRESSION MICRODISCECTOMY Left 09/13/2020   Procedure: Left Lumbar four-five Laminectomy for facet/synovial cyst;  Surgeon: Barnett Abu, MD;  Location: [facility];  Service: Neurosurgery;  Laterality: Left;    Allergies  Allergen Reactions   Penicillins Rash    Has patient had a PCN reaction causing immediate rash, facial/tongue/throat swelling, SOB or lightheadedness with hypotension: no Has patient had a PCN reaction causing severe rash involving mucus membranes or skin necrosis:yes Has patient had a PCN reaction that required hospitalization: no Has patient had a PCN reaction occurring  within the last 10 years: no If all of the above answers are "NO", then may proceed with Cephalosporin use.        Objective:    Physical Exam Vitals and nursing note reviewed.  Constitutional:      General: He is not in acute distress.    Appearance: Normal appearance.  HENT:     Head: Normocephalic.  Cardiovascular:     Rate and Rhythm: Normal rate and regular rhythm.  Pulmonary:     Effort: Pulmonary effort is normal.     Breath sounds: Normal breath sounds.  Musculoskeletal:        General: Normal range of motion.     Cervical back: Normal range of motion.  Skin:    General: Skin is warm and dry.  Neurological:     Mental Status: He is alert and oriented to person, place, and time.  Psychiatric:        Mood and Affect: Mood normal.     BP 132/79 (BP Location: Left Arm, Patient Position: Sitting, Cuff Size: Large)   Pulse 66   Temp 98.4 F (36.9 C) (Temporal)   Ht 5\' 9"  (1.753 m)   Wt 207 lb 2 oz (94 kg)   SpO2 99%   BMI 30.59 kg/m  Wt Readings from Last 3 Encounters:  12/16/21 207 lb 2 oz (94 kg)  04/05/21 213 lb 12.8 oz (97 kg)  03/20/21 212 lb 12.8 oz (96.5 kg)        Meds ordered this encounter  Medications   polyethylene glycol powder (GLYCOLAX/MIRALAX) 17 GM/SCOOP powder    Sig: Take 17 g by mouth daily. START AFTER you have had a normal, soft BM with taking the Dulcolax or Magnesium citrate.    Dispense:  510 g    Refill:  1    Order Specific Question:   Supervising Provider    Answer:   ANDY, CAMILLE L [[id number]]   bisacodyl 5 MG EC tablet    Sig: Take 2 tablets (10 mg total) by mouth as directed. ONLY TAKE if there is no liquid Magnesium Citrate on the shelf. Take 2 pills daily until you have a large, soft BM, then stop and switch to Miralax powder daily.    Dispense:  10 tablet    Refill:  0    Order Specific Question:   Supervising Provider    Answer:   ANDY, CAMILLE L [[id number]]    05/20/21, NP

## 2022-03-03 IMAGING — CR DG LUMBAR SPINE 2-3V
2 series · 2 of 2 positions shown · non-contrast
Comparison: None.

CLINICAL DATA: Elective surgery.

EXAM:
LUMBAR SPINE - 2-3 VIEW

[lateral (1 of 2)]
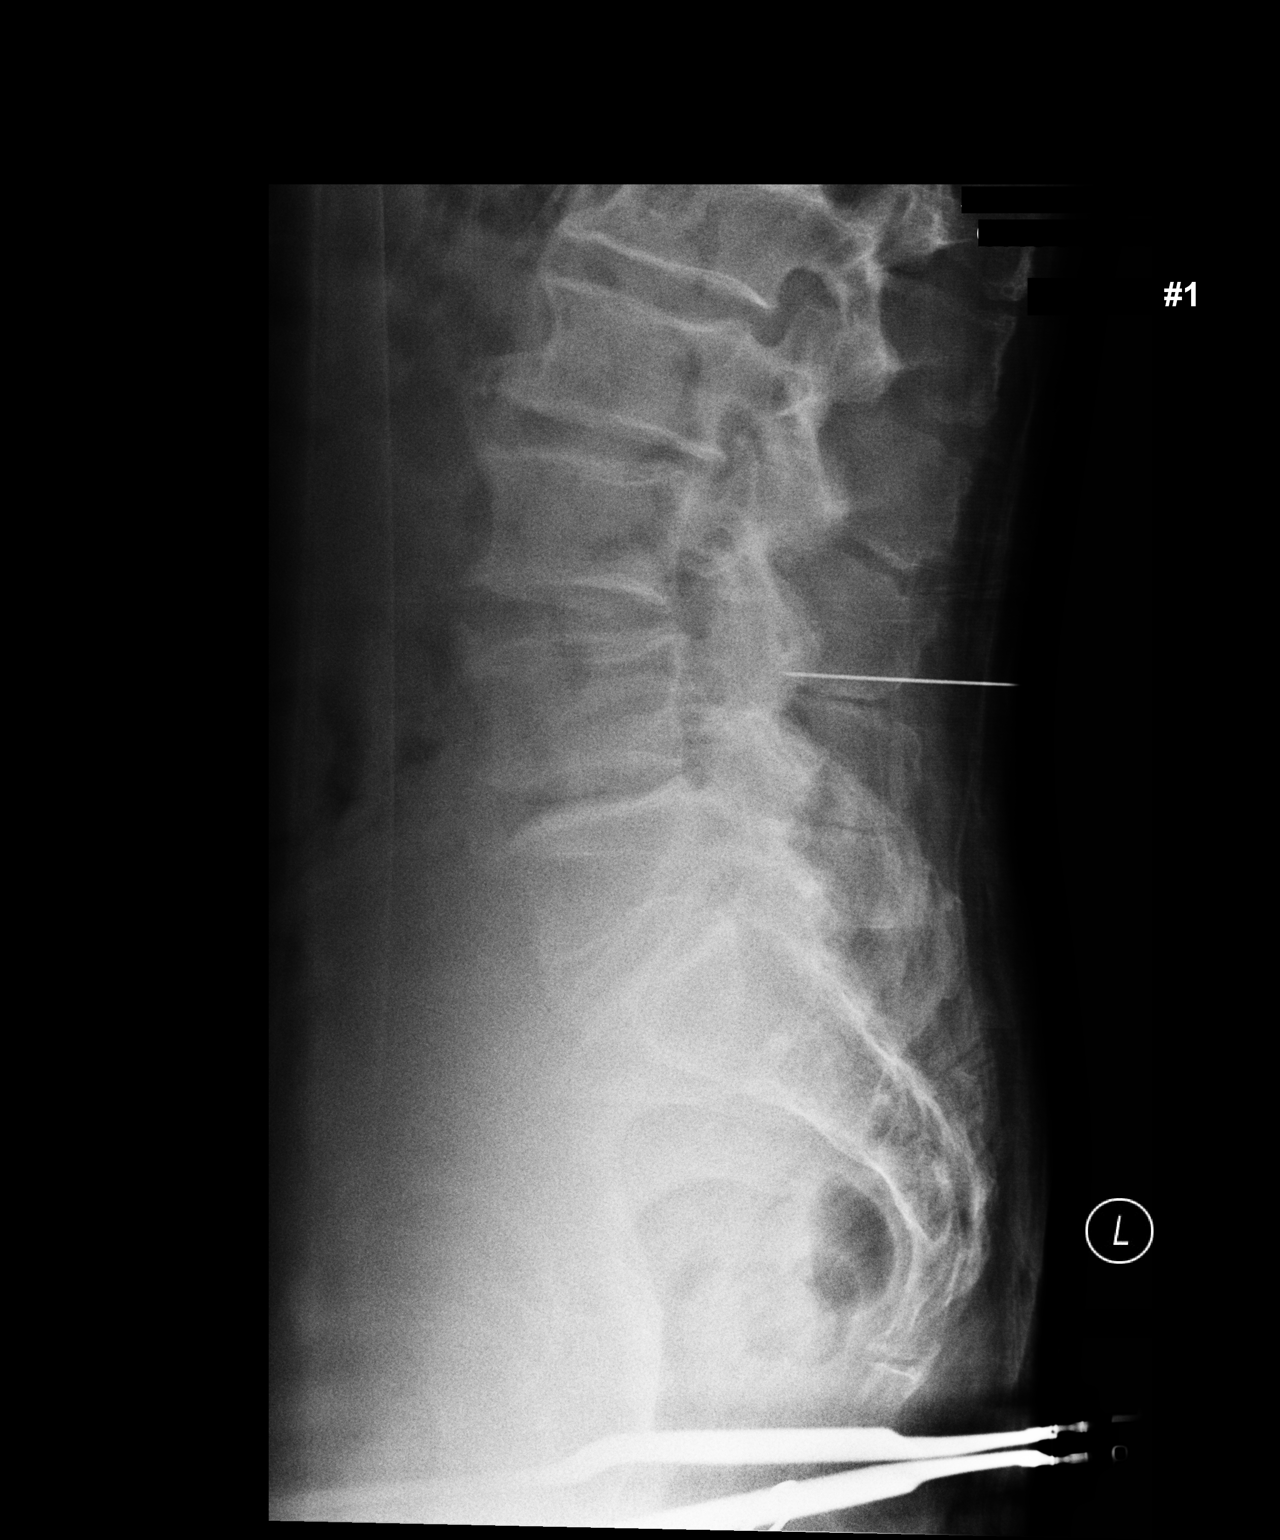

[lateral (2 of 2)]
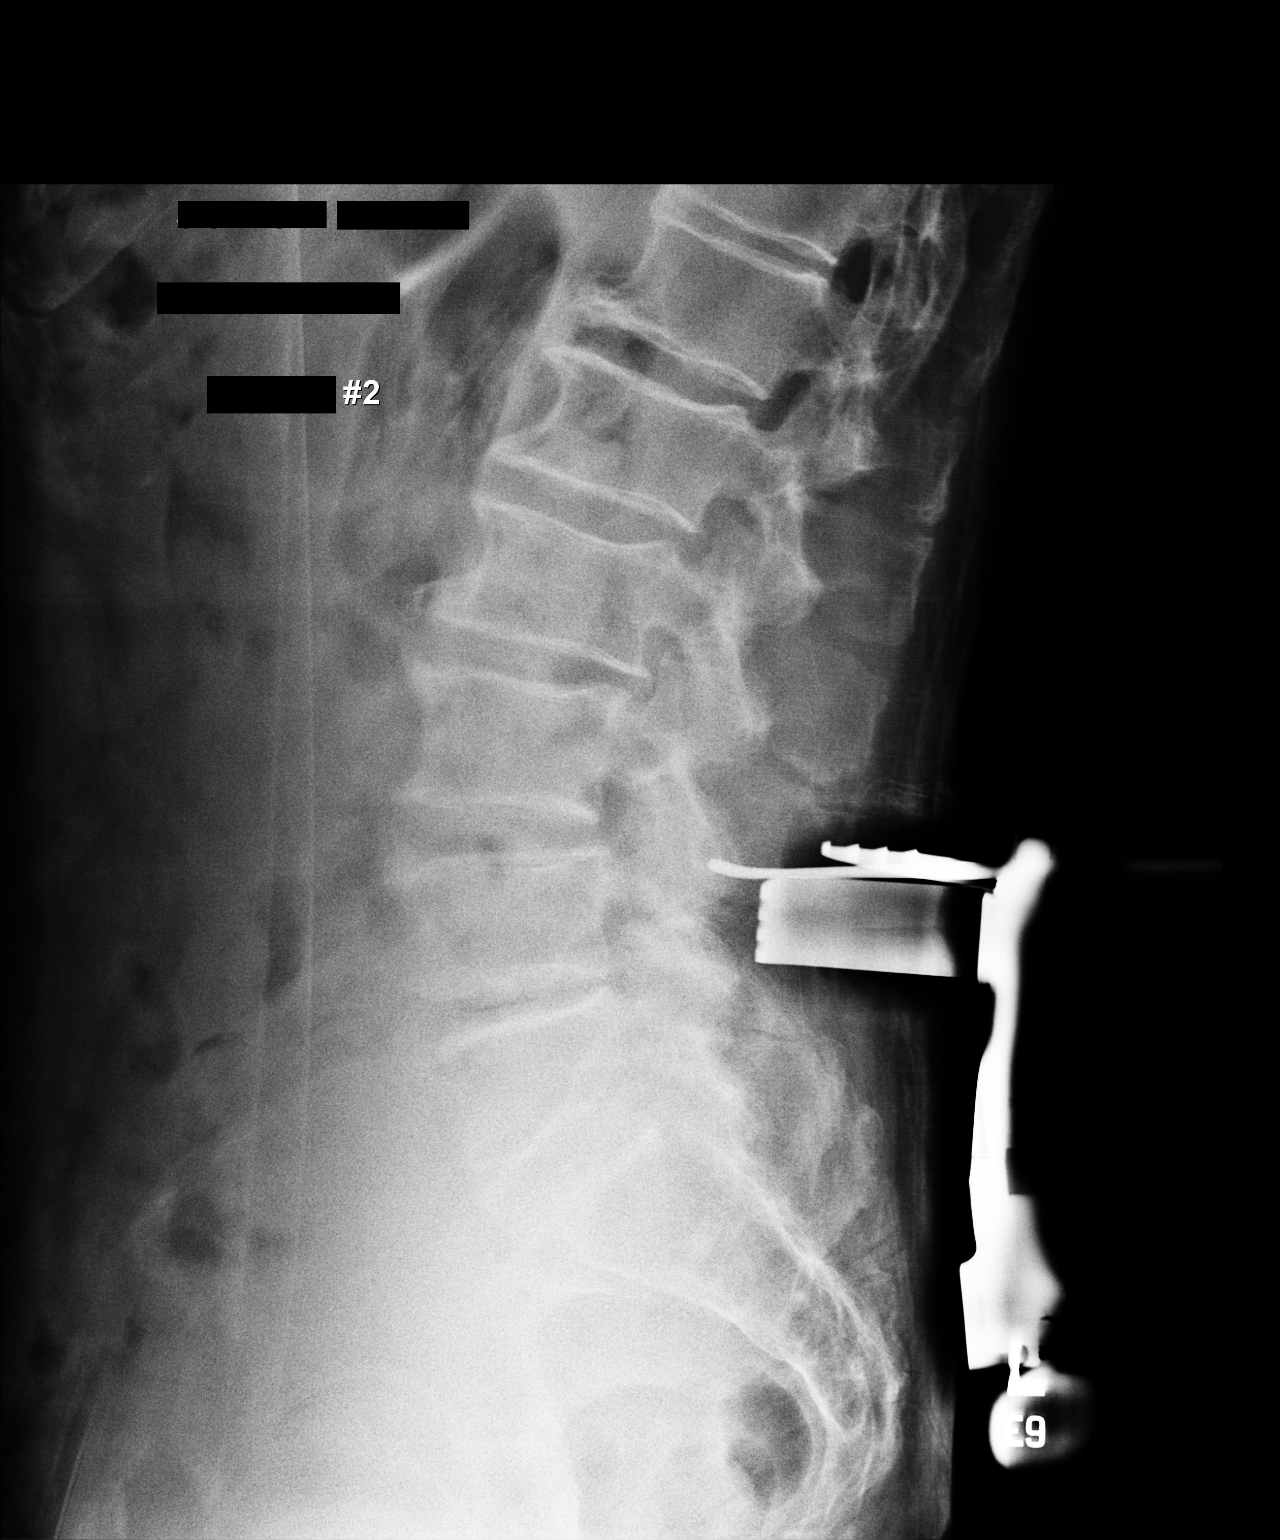

[2 of 2 positions shown; findings below may reference images not displayed]

FINDINGS: Two lateral views obtained in the operating room. Image 1
demonstrates surgical instrument posterior to L4. Image 2
demonstrates surgical instrument posterior to L4-L5.
IMPRESSION: Intraoperative lateral spot views with surgical instrument posterior
to L4 and L4-L5.

## 2022-03-20 ENCOUNTER — Telehealth: Payer: Self-pay | Admitting: Specialist

## 2022-03-20 NOTE — Telephone Encounter (Signed)
Patient called asked for a round of steroid for his back. Patient said he is having a lot of pain in his lower back. The number to contact patient is [phone]

## 2022-03-26 ENCOUNTER — Ambulatory Visit (INDEPENDENT_AMBULATORY_CARE_PROVIDER_SITE_OTHER): Payer: Medicare Other | Admitting: Surgery

## 2022-03-26 ENCOUNTER — Encounter: Payer: Self-pay | Admitting: Surgery

## 2022-03-26 ENCOUNTER — Ambulatory Visit: Payer: Self-pay

## 2022-03-26 VITALS — BP 120/73 | HR 57 | Ht 69.0 in | Wt 207.1 lb

## 2022-03-26 DIAGNOSIS — M4316 Spondylolisthesis, lumbar region: Secondary | ICD-10-CM | POA: Diagnosis not present

## 2022-03-26 DIAGNOSIS — M4726 Other spondylosis with radiculopathy, lumbar region: Secondary | ICD-10-CM | POA: Diagnosis not present

## 2022-03-26 DIAGNOSIS — M48062 Spinal stenosis, lumbar region with neurogenic claudication: Secondary | ICD-10-CM | POA: Diagnosis not present

## 2022-03-27 NOTE — Progress Notes (Signed)
Office Visit Note   Patient: Jonathon [name]           Date of Birth: [birth year]           MRN: [id number] Visit Date: 03/26/2022              Requested by: Marin Olp, MD [place],  [place] [address] PCP: Marin Olp, MD   Assessment & Plan: Visit Diagnoses:  1. Spondylolisthesis, lumbar region   2. Other spondylosis with radiculopathy, lumbar region   3. Neurogenic claudication due to lumbar spinal stenosis     Plan: I advised patient that I would order lumbar MRI and since Dr. Ellene Route did his previous surgery and he is familiar with patient's problem that he can follow-up with him in a couple of weeks to discuss results and further treatment options.  I advised patient that Dr. Louanne Skye is leaving so he would obviously not be able to do the procedure with instrumented fusion if that was recommended.  All questions answered.  Follow-Up Instructions: No follow-ups on file.   Orders:  Orders Placed This Encounter  Procedures   XR Lumbar Spine 2-3 Views   MR Lumbar Spine W Wo Contrast   No orders of the defined types were placed in this encounter.     Procedures: No procedures performed   Clinical Data: No additional findings.   Subjective: Chief Complaint  Patient presents with   Lower Back - Pain    HPI 81 year old white male with history of lumbar spondylosis comes in with complaints of low back pain.  I have previously seen patient last year and referred him to Dr. Louanne Skye to review lumbar MRI that was ordered.  He was seen last by Dr. Louanne Skye August 01, 2020.  At one point Dr. Louanne Skye had recommended a L4-5 fusion.  Patient then went to Dr. Kristeen Miss who performed a left L4-5 laminectomy for facet synovial cyst September 13, 2020.  Patient states that he is been having increased back pain and describes having neurogenic claudication symptoms that have been worsening.  I asked him if he has been back to Dr. Ellene Route and he said no.  He has been  taking Tylenol, BC powders without any improvement. Review of Systems No current cardiopulmonary GI/GU issues  Objective: Vital Signs: BP 120/73   Pulse (!) 57   Ht 5' 9"  (1.753 m)   Wt 207 lb 2.1 oz (94 kg)   BMI 30.59 kg/m   Physical Exam HENT:     Head: Normocephalic and atraumatic.  Eyes:     Extraocular Movements: Extraocular movements intact.  Musculoskeletal:     Comments: Patient does have some bilateral lumbar paraspinal tenderness.  Negative log about hips.  Negative straight leg raise.  No focal motor deficits.  Neurological:     Mental Status: He is alert and oriented to person, place, and time.  Psychiatric:        Mood and Affect: Mood normal.     Ortho Exam  Specialty Comments:  No specialty comments available.  Imaging: No results found.   PMFS History: Patient Active Problem List   Diagnosis Date Noted   Emphysema lung ([place]) 12/03/2020   Lumbar radiculopathy, chronic 09/13/2020   Degenerative lumbar spinal stenosis 04/06/2019   OSA on CPAP 12/03/2017   Jock itch 07/31/2017   Hyperlipidemia 01/28/2017   Pulmonary nodules 12/26/2016   Aortic atherosclerosis ([place]) 10/28/2016   Genetic testing 10/14/2016   Family history  of colon cancer 10/02/2016   Family history of breast cancer    Hilar lymphadenopathy 09/23/2016   Lymph nodes enlarged 09/23/2016   Family history of genetic disease 08/28/2016   Macular degeneration 07/21/2016   Neuropathy    DDD (degenerative disc disease), lumbar    GAD (generalized anxiety disorder)    Cigarette smoker    Past Medical History:  Diagnosis Date   Anxiety    never tried on SSRI. xanax 0.76m BID through Dr. BTollie Pizza    DDD (degenerative disc disease), lumbar    lumbar DDD- follows with orthopedist Dr. NLinus Ornortho. mobic 151mdaily and hydrocodone 5/325 prn   Family history of breast cancer    HLD (hyperlipidemia)    Macular degeneration 07/21/2016   Follows with optho- areds2. He states he is  unsure of diagnosis.    Neuropathy    unknown cause ? back related(sees ortho for back). burning sensation in feet. gabapentin 10071mfter dinner, 2 before bed   Smoker    8-10 cigarettes a day. plus nicorette. 60 years. over 50 pack years   Venous stasis     in the past- Wound center on legs. Elevate. Compression stockings.     Family History  Problem Relation Age of Onset   Other Mother        "old age" per patient    Dementia Mother    Alcohol abuse Father        contributed   Cancer Sister        unknown type   Alcohol abuse Brother    Breast cancer Sister        dx in her early 30s32sOther Sister        PALB2+   Breast cancer Maternal Aunt    Colon cancer Maternal Uncle    Breast cancer Other 39    Past Surgical History:  Procedure Laterality Date   ESOPHAGOGASTRODUODENOSCOPY Left 05/28/2016    Surgeon: JyoJuanita CraverD; food stuck in esophagus   LUMBAR LAMINECTOMY/DECOMPRESSION MICRODISCECTOMY Left 09/13/2020   Procedure: Left Lumbar four-five Laminectomy for facet/synovial cyst;  Surgeon: ElsKristeen MissD;  [facility];  Laterality: Left;   Social History   Occupational History   Occupation: semi retired  Tobacco Use   Smoking status: Every Day    Packs/day: 0.50    Years: 62.00    Total pack years: 31.00    Types: Cigarettes    Start date: 1958   Smokeless tobacco: Never   Tobacco comments:    02/10/19- .5 pack   Vaping Use   Vaping Use: Never used  Substance and Sexual Activity   Alcohol use: Not Currently    Comment: rare drinking occasionally   Drug use: No   Sexual activity: Not on file

## 2022-04-01 DIAGNOSIS — Z6829 Body mass index (BMI) 29.0-29.9, adult: Secondary | ICD-10-CM | POA: Diagnosis not present

## 2022-04-01 DIAGNOSIS — M48061 Spinal stenosis, lumbar region without neurogenic claudication: Secondary | ICD-10-CM | POA: Diagnosis not present

## 2022-04-10 DIAGNOSIS — M5416 Radiculopathy, lumbar region: Secondary | ICD-10-CM | POA: Diagnosis not present

## 2022-04-10 DIAGNOSIS — M5116 Intervertebral disc disorders with radiculopathy, lumbar region: Secondary | ICD-10-CM | POA: Diagnosis not present

## 2022-04-15 ENCOUNTER — Ambulatory Visit
Admission: RE | Admit: 2022-04-15 | Discharge: 2022-04-15 | Disposition: A | Discharge status: Home / Self Care | Payer: Medicare Other | Source: Ambulatory Visit | Attending: Surgery | Admitting: Surgery

## 2022-04-15 DIAGNOSIS — M545 Low back pain, unspecified: Secondary | ICD-10-CM | POA: Diagnosis not present

## 2022-04-15 DIAGNOSIS — M4726 Other spondylosis with radiculopathy, lumbar region: Secondary | ICD-10-CM

## 2022-04-15 DIAGNOSIS — M48062 Spinal stenosis, lumbar region with neurogenic claudication: Secondary | ICD-10-CM

## 2022-04-15 MED ORDER — GADOPICLENOL 0.5 MMOL/ML IV SOLN
10.0000 mL | Freq: Once | INTRAVENOUS | Status: AC | PRN
  Administered 2022-04-15: 10 mL via INTRAVENOUS

## 2022-04-21 ENCOUNTER — Ambulatory Visit (INDEPENDENT_AMBULATORY_CARE_PROVIDER_SITE_OTHER): Payer: Medicare Other

## 2022-04-21 VITALS — BP 126/64 | HR 68 | Temp 97.8°F | Wt 206.2 lb

## 2022-04-21 DIAGNOSIS — Z0000 Encounter for general adult medical examination without abnormal findings: Secondary | ICD-10-CM | POA: Diagnosis not present

## 2022-04-21 DIAGNOSIS — Z23 Encounter for immunization: Secondary | ICD-10-CM

## 2022-04-21 NOTE — Progress Notes (Signed)
Subjective:   Jonathon [name] is a 82 y.o. male who presents for Medicare Annual/Subsequent preventive examination.  Review of Systems     Cardiac Risk Factors include: advanced age (>68mn, >>8women);male gender;dyslipidemia;obesity (BMI >30kg/m2);smoking/ tobacco exposure     Objective:    Today's Vitals   04/21/22 0800  BP: 126/64  Pulse: 68  Temp: 97.8 F (36.6 C)  SpO2: 99%  Weight: 206 lb 3.2 oz (93.5 kg)   Body mass index is 30.45 kg/m.     04/21/2022    8:09 AM 04/05/2021    3:19 PM 09/13/2020    8:59 AM 03/30/2020    3:28 PM 03/16/2019    1:29 PM 12/03/2017   10:23 AM 10/28/2016    9:22 AM  Advanced Directives  Does Patient Have a Medical Advance Directive? Yes Yes No No Yes Yes No  Type of [place]   Does patient want to make changes to medical advance directive?     No - Patient declined    Copy of [place]? No - copy requested No - copy requested   No - copy requested    Would patient like information on creating a medical advance directive?   No - Patient declined        Current Medications (verified) Outpatient Encounter Medications as of 04/21/2022  Medication Sig   ALPRAZolam (XANAX) 0.5 MG tablet TAKE 1 TABLET BY MOUTH TWICE A DAY AS NEEDED FOR ANXIETY   aspirin EC 81 MG tablet Take 81 mg by mouth daily.   atorvastatin (LIPITOR) 20 MG tablet TAKE 1 TABLET BY MOUTH EVERY DAY   cholecalciferol (VITAMIN D3) 25 MCG (1000 UNIT) tablet Take 1,000 Units by mouth daily.   DENTA 5000 PLUS 1.1 % CREA dental cream Take by mouth as directed.   escitalopram (LEXAPRO) 5 MG tablet TAKE 1 TABLET (5 MG TOTAL) BY MOUTH DAILY.   gabapentin (NEURONTIN) 400 MG capsule TAKE 1 CAPSULE MIDDAY AND 2 CAPSULES AT BEDTIME.   meloxicam (MOBIC) 15 MG tablet TAKE 1 TABLET BY MOUTH EVERY DAY WITH EVENING MEAL   Multiple  Vitamins-Minerals (PRESERVISION AREDS 2 PO) Take 2 tablets by mouth daily.   polyethylene glycol powder (GLYCOLAX/MIRALAX) 17 GM/SCOOP powder Take 17 g by mouth daily. START AFTER you have had a normal, soft BM with taking the Dulcolax or Magnesium citrate.   bisacodyl 5 MG EC tablet Take 2 tablets (10 mg total) by mouth as directed. ONLY TAKE if there is no liquid Magnesium Citrate on the shelf. Take 2 pills daily until you have a large, soft BM, then stop and switch to Miralax powder daily. (Patient not taking: Reported on 04/21/2022)   [DISCONTINUED] ketoconazole (NIZORAL) 2 % cream Apply 1 application topically daily. For jock itch up to 2 weeks (Patient not taking: Reported on 04/21/2022)   No facility-administered encounter medications on file as of 04/21/2022.    Allergies (verified) Penicillins   History: Past Medical History:  Diagnosis Date   Anxiety    never tried on SSRI. xanax 0.550mBID through Dr. BuTollie Pizza   DDD (degenerative disc disease), lumbar    lumbar DDD- follows with orthopedist Dr. NiLinus Ornrtho. mobic 1578maily and hydrocodone 5/325 prn   Family history of breast cancer    HLD (hyperlipidemia)    Macular degeneration 07/21/2016   Follows with optho- areds2. He  states he is unsure of diagnosis.    Neuropathy    unknown cause ? back related(sees ortho for back). burning sensation in feet. gabapentin 16m after dinner, 2 before bed   Smoker    8-10 cigarettes a day. plus nicorette. 60 years. over 50 pack years   Venous stasis     in the past- Wound center on legs. Elevate. Compression stockings.    Past Surgical History:  Procedure Laterality Date   ESOPHAGOGASTRODUODENOSCOPY Left 05/28/2016    Surgeon: JJuanita Craver MD; food stuck in esophagus   LUMBAR LAMINECTOMY/DECOMPRESSION MICRODISCECTOMY Left 09/13/2020   Procedure: Left Lumbar four-five Laminectomy for facet/synovial cyst;  Surgeon: EKristeen Miss MD;  [facility]  Service: Neurosurgery;   Laterality: Left;   Family History  Problem Relation Age of Onset   Other Mother        "old age" per patient    Dementia Mother    Alcohol abuse Father        contributed   Cancer Sister        unknown type   Alcohol abuse Brother    Breast cancer Sister        dx in her early 332s  Other Sister        PALB2+   Breast cancer Maternal Aunt    Colon cancer Maternal Uncle    Breast cancer Other 317  Social History   Socioeconomic History   Marital status: Married    Spouse name: Not on file   Number of children: Not on file   Years of education: Not on file   Highest education level: Not on file  Occupational History   Occupation: semi retired  Tobacco Use   Smoking status: Every Day    Packs/day: 0.25    Years: 62.00    Total pack years: 15.50    Types: Cigarettes    Start date: 1958   Smokeless tobacco: Never   Tobacco comments:    02/10/19- .5 pack   Vaping Use   Vaping Use: Never used  Substance and Sexual Activity   Alcohol use: Not Currently    Comment: rare drinking occasionally   Drug use: No   Sexual activity: Not on file  Other Topics Concern   Not on file  Social History Narrative   Married around 1964. 3 children. 6 grandkids.       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, fast foodproducts on the side   Social Determinants of Health   Financial Resource Strain: Low Risk  (04/21/2022)   Overall Financial Resource Strain (CARDIA)    Difficulty of Paying Living Expenses: Not hard at all  Food Insecurity: No Food Insecurity (04/21/2022)   Hunger Vital Sign    Worried About Running Out of Food in the Last Year: Never true    RWaunetain the Last Year: Never true  Transportation Needs: No Transportation Needs (04/21/2022)   PRAPARE - THydrologist(Medical): No    Lack of Transportation (Non-Medical): No  Physical Activity: Inactive (04/21/2022)   Exercise Vital Sign    Days of  Exercise per Week: 0 days    Minutes of Exercise per Session: 0 min  Stress: No Stress Concern Present (04/21/2022)   FP[place]   Feeling of Stress : Not at all  Social Connections: Moderately Isolated (04/21/2022)   Social Connection and Isolation Panel [NHANES]  Frequency of Communication with Friends and Family: Once a week    Frequency of Social Gatherings with Friends and Family: More than three times a week    Attends Religious Services: Never    Marine scientist or Organizations: No    Attends Archivist Meetings: Never    Marital Status: Married    Tobacco Counseling Ready to quit: Not Answered Counseling given: Not Answered Tobacco comments: 02/10/19- .5 pack    Clinical Intake:  Pre-visit preparation completed: Yes  Pain : No/denies pain     BMI - recorded: 30.45 Nutritional Status: BMI > 30  Obese Nutritional Risks: None Diabetes: No  How often do you need to have someone help you when you read instructions, pamphlets, or other written materials from your doctor or pharmacy?: 1 - Never  Diabetic?no  Interpreter Needed?: No  Information entered by :: Charlott Rakes, LPN   Activities of Daily Living    04/21/2022    8:10 AM  In your present state of health, do you have any difficulty performing the following activities:  Hearing? 0  Vision? 0  Difficulty concentrating or making decisions? 0  Walking or climbing stairs? 0  Dressing or bathing? 0  Doing errands, shopping? 0  Preparing Food and eating ? N  Using the Toilet? N  In the past six months, have you accidently leaked urine? N  Do you have problems with loss of bowel control? N  Managing your Medications? N  Managing your Finances? N  Housekeeping or managing your Housekeeping? N    Patient Care Team: Marin Olp, MD as PCP - General (Family Medicine) Rigoberto Noel, MD as Consulting Physician  (Pulmonary Disease) Monna Fam, MD as Consulting Physician (Ophthalmology) Juanita Craver, MD as Consulting Physician (Gastroenterology) [facility] as Consulting Physician  Indicate any recent Medical Services you may have received from other than [facility] providers in the past year (date may be approximate).     Assessment:   This is a routine wellness examination for Jonathon [name].  Hearing/Vision screen Hearing Screening - Comments:: Denies any hearing issues  Vision Screening - Comments:: Pt follows upwith Dr Almedia Balls for annul eye exaMS   Dietary issues and exercise activities discussed: Current Exercise Habits: The patient does not participate in regular exercise at present (a little yard work at times)   Goals Addressed             This Visit's Progress    Patient Stated       Get rid of this back pain        Depression Screen    04/21/2022    8:08 AM 04/05/2021    3:18 PM 10/04/2020    8:50 AM 03/30/2020    3:26 PM 09/29/2019    9:53 AM 05/30/2019    1:08 PM 04/29/2019    8:01 AM  PHQ 2/9 Scores  PHQ - 2 Score 0 0 0 0 0 0 0  PHQ- 9 Score   0  1 0 2    Fall Risk    04/21/2022    8:10 AM 04/05/2021    3:19 PM 10/04/2020    8:50 AM 03/30/2020    3:30 PM 05/30/2019    1:07 PM  [place] in the past year? 0 0 0 0 0  Number falls in past yr: 0 0 0 0 0  Injury with Fall? 0 0 0 0 0  Risk for fall due to :  Impaired vision Impaired vision  Impaired balance/gait;Impaired mobility   Follow up Falls prevention discussed Falls prevention discussed  Falls prevention discussed     FALL RISK PREVENTION PERTAINING TO THE HOME:  Any stairs in or around the home? Yes  If so, are there any without handrails? No  Home free of loose throw rugs in walkways, pet beds, electrical cords, etc? Yes  Adequate lighting in your home to reduce risk of falls? Yes   ASSISTIVE DEVICES UTILIZED TO PREVENT FALLS:  Life alert? No  Use of a cane, walker or w/c? No  Grab  bars in the bathroom? Yes  Shower chair or bench in shower? No  Elevated toilet seat or a handicapped toilet? No   TIMED UP AND GO:  Was the test performed? Yes .  Length of time to ambulate 10 feet: 10 sec.   Gait steady and fast without use of assistive device  Cognitive Function:        04/21/2022    8:11 AM 04/05/2021    3:20 PM 03/30/2020    3:34 PM 03/16/2019    3:01 PM 10/28/2016    9:31 AM  6CIT Screen  What Year? 0 points 0 points 0 points 0 points 0 points  What month? 0 points 0 points 0 points 0 points 0 points  What time? 0 points 0 points  0 points 0 points  Count back from 20 0 points 0 points 0 points 0 points 0 points  Months in reverse 4 points 4 points 4 points 0 points 0 points  Repeat phrase 0 points 0 points 0 points 2 points 6 points  Total Score 4 points 4 points  2 points 6 points    Immunizations Immunization History  Administered Date(s) Administered   Fluad Quad(high Dose 65+) 02/10/2019, 03/01/2020, 03/20/2021, 04/21/2022   Influenza Split 03/16/2014, 03/17/2015, 02/15/2016   Influenza, High Dose Seasonal PF 05/18/2015, 03/18/2016, 02/14/2017, 03/26/2017, 04/16/2018, 06/04/2018   Influenza-Unspecified 04/16/2012, 05/18/2015, 03/18/2016, 04/30/2016, 03/17/2019, 03/16/2020, 03/16/2021   Moderna Sars-Covid-2 Vaccination 07/07/2019, 08/04/2019, 07/04/2020, 12/14/2020   Pneumococcal Conjugate-13 01/31/2014   Pneumococcal Polysaccharide-23 12/03/2017   Pneumococcal-Unspecified 03/17/2011   Tdap 03/21/2019   Zoster Recombinat (Shingrix) 08/10/2020, 11/19/2020   Zoster, Live 07/06/2013    TDAP status: Up to date  Flu Vaccine status: Completed at today's visit  Pneumococcal vaccine status: Up to date  Covid-19 vaccine status: Completed vaccines  Qualifies for Shingles Vaccine? Yes   Zostavax completed Yes   Shingrix Completed?: Yes  Screening Tests Health Maintenance  Topic Date Due   COVID-19 Vaccine (5 - Moderna series) 02/08/2021    Medicare Annual Wellness (AWV)  04/22/2023   TETANUS/TDAP  03/20/2029   Pneumonia Vaccine 37+ Years old  Completed   INFLUENZA VACCINE  Completed   Zoster Vaccines- Shingrix  Completed   HPV VACCINES  Aged Out    Health Maintenance  Health Maintenance Due  Topic Date Due   COVID-19 Vaccine (5 - Moderna series) 02/08/2021    Colorectal cancer screening: No longer required.    Additional Screening:  Vision Screening: Recommended annual ophthalmology exams for early detection of glaucoma and other disorders of the eye. Is the patient up to date with their annual eye exam?  Yes  Who is the provider or what is the name of the office in which the patient attends annual eye exams? Dr Herbert Deaner  If pt is not established with a provider, would they like to be referred to a provider to establish care? No .  Dental Screening: Recommended annual dental exams for proper oral hygiene  Community Resource Referral / Chronic Care Management: CRR required this visit?  No   CCM required this visit?  No      Plan:     I have personally reviewed and noted the following in the patient's chart:   Medical and social history Use of alcohol, tobacco or illicit drugs  Current medications and supplements including opioid prescriptions. Patient is not currently taking opioid prescriptions. Functional ability and status Nutritional status Physical activity Advanced directives List of other physicians Hospitalizations, surgeries, and ER visits in previous 12 months Vitals Screenings to include cognitive, depression, and falls Referrals and appointments  In addition, I have reviewed and discussed with patient certain preventive protocols, quality metrics, and best practice recommendations. A written personalized care plan for preventive services as well as general preventive health recommendations were provided to patient.     Willette Brace, LPN   [id number]   Nurse Notes: none

## 2022-04-21 NOTE — Patient Instructions (Signed)
[name] , Thank you for taking time to come for your Medicare Wellness Visit. I appreciate your ongoing commitment to your health goals. Please review the following plan we discussed and let me know if I can assist you in the future.   These are the goals we discussed:  Goals      patient     Keep doing what you doing now; staying active       Patient Stated     Maintain your health !      Patient Stated     Stay healthy     Patient Stated     None at this time      Quit Smoking     Now smokes 1/2 pack from a pack a day  Smoking;  Educated to avoid secondary smoke   Meds may help; chatix (Varenicline); Zyban (Bupropion SR); Nicotine Replacement (gum; lozenges; patches; etc.)           This is a list of the screening recommended for you and due dates:  Health Maintenance  Topic Date Due   COVID-19 Vaccine (5 - Moderna series) 02/08/2021   Flu Shot  01/14/2022   Medicare Annual Wellness Visit  04/22/2023   Tetanus Vaccine  03/20/2029   Pneumonia Vaccine  Completed   Zoster (Shingles) Vaccine  Completed   HPV Vaccine  Aged Out    Advanced directives: Please bring a copy of your health care power of attorney and living will to the office at your convenience.  Conditions/risks identified: get rid of back pain   Next appointment: Follow up in one year for your annual wellness visit.   Preventive Care 27 Years and Older, Male  Preventive care refers to lifestyle choices and visits with your health care provider that can promote health and wellness. What does preventive care include? A yearly physical exam. This is also called an annual well check. Dental exams once or twice a year. Routine eye exams. Ask your health care provider how often you should have your eyes checked. Personal lifestyle choices, including: Daily care of your teeth and gums. Regular physical activity. Eating a healthy diet. Avoiding tobacco and drug use. Limiting alcohol  use. Practicing safe sex. Taking low doses of aspirin every day. Taking vitamin and mineral supplements as recommended by your health care provider. What happens during an annual well check? The services and screenings done by your health care provider during your annual well check will depend on your age, overall health, lifestyle risk factors, and family history of disease. Counseling  Your health care provider may ask you questions about your: Alcohol use. Tobacco use. Drug use. Emotional well-being. Home and relationship well-being. Sexual activity. Eating habits. History of falls. Memory and ability to understand (cognition). Work and work Statistician. Screening  You may have the following tests or measurements: Height, weight, and BMI. Blood pressure. Lipid and cholesterol levels. These may be checked every 5 years, or more frequently if you are over 28 years old. Skin check. Lung cancer screening. You may have this screening every year starting at age 13 if you have a 30-pack-year history of smoking and currently smoke or have quit within the past 15 years. Fecal occult blood test (FOBT) of the stool. You may have this test every year starting at age 58. Flexible sigmoidoscopy or colonoscopy. You may have a sigmoidoscopy every 5 years or a colonoscopy every 10 years starting at age 50. Prostate cancer screening. Recommendations will vary depending  on your family history and other risks. Hepatitis C blood test. Hepatitis B blood test. Sexually transmitted disease (STD) testing. Diabetes screening. This is done by checking your blood sugar (glucose) after you have not eaten for a while (fasting). You may have this done every 1-3 years. Abdominal aortic aneurysm (AAA) screening. You may need this if you are a current or former smoker. Osteoporosis. You may be screened starting at age 70 if you are at high risk. Talk with your health care provider about your test results,  treatment options, and if necessary, the need for more tests. Vaccines  Your health care provider may recommend certain vaccines, such as: Influenza vaccine. This is recommended every year. Tetanus, diphtheria, and acellular pertussis (Tdap, Td) vaccine. You may need a Td booster every 10 years. Zoster vaccine. You may need this after age 33. Pneumococcal 13-valent conjugate (PCV13) vaccine. One dose is recommended after age 55. Pneumococcal polysaccharide (PPSV23) vaccine. One dose is recommended after age 9. Talk to your health care provider about which screenings and vaccines you need and how often you need them. This information is not intended to replace advice given to you by your health care provider. Make sure you discuss any questions you have with your health care provider. Document Released: 06/29/2015 Document Revised: 02/20/2016 Document Reviewed: 04/03/2015 Elsevier Interactive Patient Education  2017 [employer].  Fall Prevention in the Home Falls can cause injuries. They can happen to people of all ages. There are many things you can do to make your home safe and to help prevent falls. What can I do on the outside of my home? Regularly fix the edges of walkways and driveways and fix any cracks. Remove anything that might make you trip as you walk through a door, such as a raised step or threshold. Trim any bushes or trees on the path to your home. Use bright outdoor lighting. Clear any walking paths of anything that might make someone trip, such as rocks or tools. Regularly check to see if handrails are loose or broken. Make sure that both sides of any steps have handrails. Any raised decks and porches should have guardrails on the edges. Have any leaves, snow, or ice cleared regularly. Use sand or salt on walking paths during winter. Clean up any spills in your garage right away. This includes oil or grease spills. What can I do in the bathroom? Use night  lights. Install grab bars by the toilet and in the tub and shower. Do not use towel bars as grab bars. Use non-skid mats or decals in the tub or shower. If you need to sit down in the shower, use a plastic, non-slip stool. Keep the floor dry. Clean up any water that spills on the floor as soon as it happens. Remove soap buildup in the tub or shower regularly. Attach bath mats securely with double-sided non-slip rug tape. Do not have throw rugs and other things on the floor that can make you trip. What can I do in the bedroom? Use night lights. Make sure that you have a light by your bed that is easy to reach. Do not use any sheets or blankets that are too big for your bed. They should not hang down onto the floor. Have a firm chair that has side arms. You can use this for support while you get dressed. Do not have throw rugs and other things on the floor that can make you trip. What can I do in the kitchen?  Clean up any spills right away. Avoid walking on wet floors. Keep items that you use a lot in easy-to-reach places. If you need to reach something above you, use a strong step stool that has a grab bar. Keep electrical cords out of the way. Do not use floor polish or wax that makes floors slippery. If you must use wax, use non-skid floor wax. Do not have throw rugs and other things on the floor that can make you trip. What can I do with my stairs? Do not leave any items on the stairs. Make sure that there are handrails on both sides of the stairs and use them. Fix handrails that are broken or loose. Make sure that handrails are as long as the stairways. Check any carpeting to make sure that it is firmly attached to the stairs. Fix any carpet that is loose or worn. Avoid having throw rugs at the top or bottom of the stairs. If you do have throw rugs, attach them to the floor with carpet tape. Make sure that you have a light switch at the top of the stairs and the bottom of the stairs. If  you do not have them, ask someone to add them for you. What else can I do to help prevent falls? Wear shoes that: Do not have high heels. Have rubber bottoms. Are comfortable and fit you well. Are closed at the toe. Do not wear sandals. If you use a stepladder: Make sure that it is fully opened. Do not climb a closed stepladder. Make sure that both sides of the stepladder are locked into place. Ask someone to hold it for you, if possible. Clearly mark and make sure that you can see: Any grab bars or handrails. First and last steps. Where the edge of each step is. Use tools that help you move around (mobility aids) if they are needed. These include: Canes. Walkers. Scooters. Crutches. Turn on the lights when you go into a dark area. Replace any light bulbs as soon as they burn out. Set up your furniture so you have a clear path. Avoid moving your furniture around. If any of your floors are uneven, fix them. If there are any pets around you, be aware of where they are. Review your medicines with your doctor. Some medicines can make you feel dizzy. This can increase your chance of falling. Ask your doctor what other things that you can do to help prevent falls. This information is not intended to replace advice given to you by your health care provider. Make sure you discuss any questions you have with your health care provider. Document Released: 03/29/2009 Document Revised: 11/08/2015 Document Reviewed: 07/07/2014 Elsevier Interactive Patient Education  2017 [employer].

## 2022-05-10 ENCOUNTER — Other Ambulatory Visit: Payer: Self-pay | Admitting: Family Medicine

## 2022-05-21 ENCOUNTER — Ambulatory Visit (INDEPENDENT_AMBULATORY_CARE_PROVIDER_SITE_OTHER): Payer: Medicare Other | Admitting: Family Medicine

## 2022-05-21 ENCOUNTER — Encounter: Payer: Self-pay | Admitting: Family Medicine

## 2022-05-21 VITALS — BP 110/60 | HR 85 | Temp 97.6°F | Ht 69.0 in | Wt 197.6 lb

## 2022-05-21 DIAGNOSIS — E785 Hyperlipidemia, unspecified: Secondary | ICD-10-CM | POA: Diagnosis not present

## 2022-05-21 DIAGNOSIS — I700 Atherosclerosis of aorta: Secondary | ICD-10-CM | POA: Diagnosis not present

## 2022-05-21 DIAGNOSIS — F411 Generalized anxiety disorder: Secondary | ICD-10-CM

## 2022-05-21 DIAGNOSIS — F17210 Nicotine dependence, cigarettes, uncomplicated: Secondary | ICD-10-CM | POA: Diagnosis not present

## 2022-05-21 DIAGNOSIS — G4733 Obstructive sleep apnea (adult) (pediatric): Secondary | ICD-10-CM

## 2022-05-21 DIAGNOSIS — J432 Centrilobular emphysema: Secondary | ICD-10-CM

## 2022-05-21 LAB — URINALYSIS, ROUTINE W REFLEX MICROSCOPIC
Bilirubin Urine: NEGATIVE
Hgb urine dipstick: NEGATIVE
Leukocytes,Ua: NEGATIVE
Nitrite: NEGATIVE
RBC / HPF: NONE SEEN (ref 0–?)
Specific Gravity, Urine: 1.025 (ref 1.000–1.030)
Total Protein, Urine: 30 — AB
Urine Glucose: NEGATIVE
Urobilinogen, UA: 1 (ref 0.0–1.0)
pH: 6 (ref 5.0–8.0)

## 2022-05-21 LAB — CBC WITH DIFFERENTIAL/PLATELET
Basophils Absolute: 0 10*3/uL (ref 0.0–0.1)
Basophils Relative: 0.6 % (ref 0.0–3.0)
Eosinophils Absolute: 0.1 10*3/uL (ref 0.0–0.7)
Eosinophils Relative: 1.8 % (ref 0.0–5.0)
HCT: 39.1 % (ref 39.0–52.0)
Hemoglobin: 13.3 g/dL (ref 13.0–17.0)
Lymphocytes Relative: 23.2 % (ref 12.0–46.0)
Lymphs Abs: 1.4 10*3/uL (ref 0.7–4.0)
MCHC: 34.1 g/dL (ref 30.0–36.0)
MCV: 96 fl (ref 78.0–100.0)
Monocytes Absolute: 0.7 10*3/uL (ref 0.1–1.0)
Monocytes Relative: 10.8 % (ref 3.0–12.0)
Neutro Abs: 3.9 10*3/uL (ref 1.4–7.7)
Neutrophils Relative %: 63.6 % (ref 43.0–77.0)
Platelets: 245 10*3/uL (ref 150.0–400.0)
RBC: 4.07 Mil/uL — ABNORMAL LOW (ref 4.22–5.81)
RDW: 15.1 % (ref 11.5–15.5)
WBC: 6.2 10*3/uL (ref 4.0–10.5)

## 2022-05-21 LAB — COMPREHENSIVE METABOLIC PANEL
ALT: 13 U/L (ref 0–53)
AST: 14 U/L (ref 0–37)
Albumin: 4.1 g/dL (ref 3.5–5.2)
Alkaline Phosphatase: 86 U/L (ref 39–117)
BUN: 29 mg/dL — ABNORMAL HIGH (ref 6–23)
CO2: 31 mEq/L (ref 19–32)
Calcium: 9.2 mg/dL (ref 8.4–10.5)
Chloride: 100 mEq/L (ref 96–112)
Creatinine, Ser: 1.04 mg/dL (ref 0.40–1.50)
GFR: 67.31 mL/min (ref >60.00)
Glucose, Bld: 86 mg/dL (ref 70–99)
Potassium: 4.1 mEq/L (ref 3.5–5.1)
Sodium: 138 mEq/L (ref 135–145)
Total Bilirubin: 0.8 mg/dL (ref 0.2–1.2)
Total Protein: 6.8 g/dL (ref 6.0–8.3)

## 2022-05-21 LAB — LIPID PANEL
Cholesterol: 125 mg/dL (ref 0–200)
HDL: 37.5 mg/dL — ABNORMAL LOW (ref >39.00)
LDL Cholesterol: 53 mg/dL (ref 0–99)
NonHDL: 87.21
Total CHOL/HDL Ratio: 3
Triglycerides: 170 mg/dL — ABNORMAL HIGH (ref 0.0–149.0)
VLDL: 34 mg/dL (ref 0.0–40.0)

## 2022-05-21 MED ORDER — ALPRAZOLAM 0.5 MG PO TABS: 0.5000 mg | ORAL_TABLET | Freq: Two times a day (BID) | ORAL | 5 refills | Status: DC | PRN

## 2022-05-21 NOTE — Progress Notes (Signed)
Phone [phone] In person visit   Subjective:   Jonathon [name] is a 81 y.o. year old very pleasant male patient who presents for/with See problem oriented charting Chief Complaint  Patient presents with   Follow-up   Hyperlipidemia   Anxiety    Past Medical History-  Patient Active Problem List   Diagnosis Date Noted   GAD (generalized anxiety disorder)     Priority: High   Cigarette smoker     Priority: High   Emphysema lung (HCC) 12/03/2020    Priority: Medium    OSA on CPAP 12/03/2017    Priority: Medium    Hyperlipidemia 01/28/2017    Priority: Medium    Pulmonary nodules 12/26/2016    Priority: Medium    Aortic atherosclerosis (HCC) 10/28/2016    Priority: Medium    Neuropathy     Priority: Medium    DDD (degenerative disc disease), lumbar     Priority: Medium    Genetic testing 10/14/2016    Priority: Low   Family history of colon cancer 10/02/2016    Priority: Low   Family history of breast cancer     Priority: Low   Hilar lymphadenopathy 09/23/2016    Priority: Low   Lymph nodes enlarged 09/23/2016    Priority: Low   Family history of genetic disease 08/28/2016    Priority: Low   Macular degeneration 07/21/2016    Priority: Low   Lumbar radiculopathy, chronic 09/13/2020   Degenerative lumbar spinal stenosis 04/06/2019   Jock itch 07/31/2017    Medications- reviewed and updated Current Outpatient Medications  Medication Sig Dispense Refill   ALPRAZolam (XANAX) 0.5 MG tablet Take 1 tablet (0.5 mg total) by mouth 2 (two) times daily as needed for anxiety. 1 refill per month. Do not drive for 8 hours after taking. 37 tablet 5   aspirin EC 81 MG tablet Take 81 mg by mouth daily.     atorvastatin (LIPITOR) 20 MG tablet TAKE 1 TABLET BY MOUTH EVERY DAY 90 tablet 3   cholecalciferol (VITAMIN D3) 25 MCG (1000 UNIT) tablet Take 1,000 Units by mouth daily.     escitalopram (LEXAPRO) 5 MG tablet TAKE 1 TABLET (5 MG TOTAL) BY MOUTH DAILY. 90 tablet 3    gabapentin (NEURONTIN) 400 MG capsule TAKE 1 CAPSULE MIDDAY AND 2 CAPSULES AT BEDTIME. 270 capsule 5   meloxicam (MOBIC) 15 MG tablet TAKE 1 TABLET BY MOUTH EVERY DAY WITH EVENING MEAL 90 tablet 3   Multiple Vitamins-Minerals (PRESERVISION AREDS 2 PO) Take 2 tablets by mouth daily.     polyethylene glycol powder (GLYCOLAX/MIRALAX) 17 GM/SCOOP powder Take 17 g by mouth daily. START AFTER you have had a normal, soft BM with taking the Dulcolax or Magnesium citrate. 510 g 1   No current facility-administered medications for this visit.     Objective:  BP 110/60 (BP Location: Left Arm, Patient Position: Sitting)   Pulse 85   Temp 97.6 F (36.4 C) (Temporal)   Ht 5\' 9"  (1.753 m)   Wt 197 lb 9.6 oz (89.6 kg)   SpO2 97%   BMI 29.18 kg/m  Gen: NAD, resting comfortably,smells of smoke CV: RRR no murmurs rubs or gallops Lungs: CTAB no crackles, wheeze, rhonchi Abdomen: soft/nontender/nondistended/normal bowel sounds. No rebound or guarding.  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 Then got a spell of feeling warm, hot, flushed and felt like may pass out- he took jacket off and sat down and felt better. Was very brief- if recurrent issues want him to return. No hest pain or shortness of breath. Had taken tylenol and BC powder- he has stopped since then (I agree). If recurrent issues agrees to return to care but with only one spell and so short term opted out of further wokrup other than labs -also mild weight loss in 18 months of 13 lbs- lower appetite- he will monitor and if worsening se eus  # GAD S: compliant with lexapro 5mg .  -Previously had dry mouth on zoloft 150mg  and cut to 50mg  and still had issues.  =Using alprazolam #37 per month as well. no falls on the mediciation. No confusion. Ran out 3-4 weeks ago and worsening  anxiety -no SI A/P: anxiety reasonably well controlled and no falls. We did discuss risks with medicine like alprazolam for falls or worsening memory- if these occur we will need to stop- for now will refill and recommended 6 months follow up   # Smoking #emphysema on imaging S:smoking 5 cigs per day down from 8-10 Over 50 pack years.  A/P:  aged out of lung cancer screening- recommended full cessation from smoking but congratulated him on cutting back   Emphysema on imaging but largely symptomatic- main thing is quitting smoking  # Hyperlipidemia/aortic atherosclerosis/CAD on CT scan S:compliant with atorvastatin 20mg .. With smoking history we have opted to continue aspirin as well. Alternatively Lexapro increases bleeding risk- if any future bleeding issues would d/c aspirin Aortic atherosclerosis on prior imaging as well- reasonable for statin and LDL goal under 70 Lab Results  Component Value Date   CHOL 100 10/04/2020   HDL 37.70 (L) 10/04/2020   LDLCALC 47 10/04/2020   LDLDIRECT 49 04/05/2020   TRIG 78.0 10/04/2020   CHOLHDL 3 10/04/2020  A/P: CAD on ct- no chest pain or shortness of breath  - asymptomatic- continue current medications- maintain aspirin unless bleeding issues. Does need to quit smoking Aortic atherosclerosis- suspect stable- continue current medications Lipids at goal last check- now due- update today  # Neuropathy  S:continued issues with left foot related to back. Uses gabapentin 400 at lunch and 800 before bed- finds this helpful  A/P:  reasonable control continue current medications    %-Chronic pain due to- Degenerative disc disease lumbar spine- follows with Dr. 10/06/2020 is on Mobic and hydrocodone (in past0 - Discussion with patient increase bleeding risk on meloxicam consider reduce dosing if possible- also need to monitor renal function  -Dr. 04/07/2020 placed patient on oxycodone short-term after surgery for L4-L5 lumbar laminotomy and foraminotomies and  partial facetectomy with excision of synovial cyst and decompression of L5 nerve root- apparently Dr. 10/06/2020 has recommended continuing meloxicam for now -ongoing pain issues- felt like may even need to go to ED- scheduled follow up soon with Dr. 10/06/2020 -lower appetite with ongoing pain  #OSA- uses cpap regularly    Recommended follow up: No follow-ups on file. Future Appointments  Date Time Provider Department Center  04/27/2023  7:45 AM [facility]    Lab/Order associations:   ICD-10-CM   1. Aortic atherosclerosis (HCC)  I70.0     2. Hyperlipidemia, unspecified hyperlipidemia type  E78.5 CBC with Differential/Platelet    Comprehensive metabolic panel    Lipid panel    3. Cigarette smoker  F17.210 Urinalysis, Routine w reflex microscopic  4. GAD (generalized anxiety disorder)  F41.1     5. OSA on CPAP  G47.33     6. Centrilobular emphysema (HCC) Chronic J43.2       Meds ordered this encounter  Medications   ALPRAZolam (XANAX) 0.5 MG tablet    Sig: Take 1 tablet (0.5 mg total) by mouth 2 (two) times daily as needed for anxiety. 1 refill per month. Do not drive for 8 hours after taking.    Dispense:  37 tablet    Refill:  5    Return precautions advised.  [name], MD

## 2022-05-21 NOTE — Patient Instructions (Addendum)
recommended full cessation from smoking but congratulated him on cutting back   Please stop by lab before you go If you have mychart- we will send your results within 3 business days of [place] receiving them.  If you do not have mychart- we will call you about results within 5 business days of [place] receiving them.  *please also note that you will see labs on mychart as soon as they post. I will later go in and write notes on them- will say "notes from Dr. Durene Cal"   Recommended follow up: Return in about 6 months (around 11/20/2022) for physical or sooner if needed.Schedule b4 you leave.

## 2022-05-28 DIAGNOSIS — M4726 Other spondylosis with radiculopathy, lumbar region: Secondary | ICD-10-CM | POA: Diagnosis not present

## 2022-05-28 DIAGNOSIS — Z6828 Body mass index (BMI) 28.0-28.9, adult: Secondary | ICD-10-CM | POA: Diagnosis not present

## 2022-06-03 DIAGNOSIS — R194 Change in bowel habit: Secondary | ICD-10-CM | POA: Diagnosis not present

## 2022-06-03 DIAGNOSIS — K5903 Drug induced constipation: Secondary | ICD-10-CM | POA: Diagnosis not present

## 2022-06-03 DIAGNOSIS — R140 Abdominal distension (gaseous): Secondary | ICD-10-CM | POA: Diagnosis not present

## 2022-06-03 DIAGNOSIS — R1033 Periumbilical pain: Secondary | ICD-10-CM | POA: Diagnosis not present

## 2022-06-03 DIAGNOSIS — R634 Abnormal weight loss: Secondary | ICD-10-CM | POA: Diagnosis not present

## 2022-06-04 ENCOUNTER — Other Ambulatory Visit: Payer: Self-pay

## 2022-06-04 ENCOUNTER — Ambulatory Visit: Payer: Medicare Other | Attending: Neurological Surgery

## 2022-06-04 DIAGNOSIS — M4726 Other spondylosis with radiculopathy, lumbar region: Secondary | ICD-10-CM | POA: Insufficient documentation

## 2022-06-04 DIAGNOSIS — R262 Difficulty in walking, not elsewhere classified: Secondary | ICD-10-CM | POA: Diagnosis not present

## 2022-06-04 DIAGNOSIS — R252 Cramp and spasm: Secondary | ICD-10-CM

## 2022-06-04 DIAGNOSIS — M6281 Muscle weakness (generalized): Secondary | ICD-10-CM

## 2022-06-04 DIAGNOSIS — R293 Abnormal posture: Secondary | ICD-10-CM | POA: Diagnosis not present

## 2022-06-04 DIAGNOSIS — M5459 Other low back pain: Secondary | ICD-10-CM | POA: Diagnosis not present

## 2022-06-04 NOTE — Therapy (Signed)
OUTPATIENT PHYSICAL THERAPY THORACOLUMBAR EVALUATION   Patient Name: Jonathon [name] MRN: [id number] DOB:[birth year], 81 y.o., male Today's Date: 06/04/2022  END OF SESSION:   Past Medical History:  Diagnosis Date   Anxiety    never tried on SSRI. xanax 0.5mg  BID through Dr. Doristine Counter.    DDD (degenerative disc disease), lumbar    lumbar DDD- follows with orthopedist Dr. Leron Croak ortho. mobic 15mg  daily and hydrocodone 5/325 prn   Family history of breast cancer    HLD (hyperlipidemia)    Macular degeneration 07/21/2016   Follows with optho- areds2. He states he is unsure of diagnosis.    Neuropathy    unknown cause ? back related(sees ortho for back). burning sensation in feet. gabapentin 100mg  after dinner, 2 before bed   Smoker    8-10 cigarettes a day. plus nicorette. 60 years. over 50 pack years   Venous stasis     in the past- Wound center on legs. Elevate. Compression stockings.    Past Surgical History:  Procedure Laterality Date   ESOPHAGOGASTRODUODENOSCOPY Left 05/28/2016    Surgeon: , MD; food stuck in esophagus   LUMBAR LAMINECTOMY/DECOMPRESSION MICRODISCECTOMY Left 09/13/2020   Procedure: Left Lumbar four-five Laminectomy for facet/synovial cyst;  Surgeon: Charna Elizabeth, MD;  Location: [facility] OR;  Service: Neurosurgery;  Laterality: Left;   Patient Active Problem List   Diagnosis Date Noted   Emphysema lung (HCC) 12/03/2020   Lumbar radiculopathy, chronic 09/13/2020   Degenerative lumbar spinal stenosis 04/06/2019   OSA on CPAP 12/03/2017   Jock itch 07/31/2017   Hyperlipidemia 01/28/2017   Pulmonary nodules 12/26/2016   Aortic atherosclerosis (HCC) 10/28/2016   Genetic testing 10/14/2016   Family history of colon cancer 10/02/2016   Family history of breast cancer    Hilar lymphadenopathy 09/23/2016   Lymph nodes enlarged 09/23/2016   Family history of genetic disease 08/28/2016   Macular degeneration 07/21/2016   Neuropathy    DDD  (degenerative disc disease), lumbar    GAD (generalized anxiety disorder)    Cigarette smoker     PCP: 08/30/2016, MD   REFERRING PROVIDER: 09/18/2016, MD  REFERRING DIAG: [phone] (ICD-10-CM) - Other spondylosis with radiculopathy, lumbar region  Rationale for Evaluation and Treatment: Rehabilitation  THERAPY DIAG:  Other low back pain  ONSET DATE: 06/02/2022  SUBJECTIVE:                                                                                                                                                                                           SUBJECTIVE STATEMENT: Patient states 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  He is seeing Dr. Barnett Abu.  No surgery other than cyst removal.  No leg weakness or pain reported.  He is retired.  He is unable to do his usual outdoor activities or bending, stooping and squatting.  "I can't mow my grass or work outside on anything without having to be in the bed half the day"  PERTINENT HISTORY:  Previous cyst removal by Dr. Danielle Dess  PAIN:  Are you having pain? Yes: NPRS scale: 2/10 Pain location: midline low back Pain description: aching Aggravating factors: standing, bending, stooping and squatting  Relieving factors: medication, ice, heat  PRECAUTIONS: None  WEIGHT BEARING RESTRICTIONS: No  FALLS:  Has patient fallen in last 6 months? No  LIVING ENVIRONMENT: Lives with: lives with their spouse Lives in: House/apartment  OCCUPATION: retired  PLOF: Independent, Independent with basic ADLs, Independent with household mobility without device, Independent with community mobility without device, Independent with homemaking with ambulation, Independent with gait, and Independent with transfers  PATIENT GOALS: to be able to bend, stoop and squat and do my usual outdoor work without pain so bad that I have to lay in bed the rest of the day  NEXT MD VISIT: prn  OBJECTIVE:    DIAGNOSTIC FINDINGS:  IMPRESSION: 1. Transitional anatomy with lumbarization of the S1 vertebral body. In keeping with prior reports, the lowest fully formed disc space is designated S1-S2. 2. Facet arthropathy most advanced at L4-L5 and L5-S1. 3. Mild-to-moderate right and mild left neural foraminal stenosis at L3-L4, not significantly changed. 4. At L4-L5, there is moderate spinal canal stenosis with right worse than left subarticular zone narrowing and mild-to-moderate left worse than right neural foraminal stenosis, not significantly changed since 2020. 5. At L5-S1, there is moderate spinal canal stenosis with bilateral subarticular zone narrowing and potential irritation of either traversing S1 nerve root, left worse than right, and mild-to-moderate left and mild right neural foraminal stenosis at L5-S1. The spinal canal and left subarticular zone narrowing at L5-S1 appear improved compared to the study from 2020. 6. Left worse than right subarticular zone narrowing at S1-S2 with abutment of the traversing S2 nerve roots, not significantly changed. 7. Levocurvature centered at L3 with associated right eccentric disc degeneration and degenerative endplate change at L3-L4, unchanged.      PATIENT SURVEYS:  FOTO 48 (goal is 60)  SCREENING FOR RED FLAGS: Bowel or bladder incontinence: No Spinal tumors: No Cauda equina syndrome: No Compression fracture: No Abdominal aneurysm: No  COGNITION: Overall cognitive status: Within functional limits for tasks assessed     SENSATION: WFL  MUSCLE LENGTH: Hamstrings: Right 45 deg; Left 50 deg Thomas test: Right pos ; Left slightly pos   POSTURE:  MRI notes mild scoliosis acquired   LUMBAR ROM:   AROM eval  Flexion WNL  Extension 75%  Right lateral flexion To joint line  Left lateral flexion To just above joint line  Right rotation 50%  Left rotation 50%   (Blank rows = not tested)  LOWER EXTREMITY ROM:      WFL  LOWER EXTREMITY MMT:    Generally 4+ to 5/5 throughout  LUMBAR SPECIAL TESTS:  Straight leg raise test: Negative  FUNCTIONAL TESTS:  5 times sit to stand: 28.68 sec  Timed up and go (TUG): 13.80 sec  GAIT: Distance walked: 30 Assistive device utilized: None Level of assistance: Complete Independence Comments: shuffle, poor foot clearance, antalgic  TODAY'S TREATMENT:  DATE: 06/04/22  Initial eval completed and initiated HEP   PATIENT EDUCATION:  Education details: Initiated HEP Person educated: Patient Education method: Programmer, multimedia, Facilities manager, Verbal cues, and Handouts Education comprehension: verbalized understanding, returned demonstration, and verbal cues required  HOME EXERCISE PROGRAM: Access Code: [email or web address] URL: [email or web address] Date: 06/04/2022 Prepared by: Mikey Kirschner  Exercises - Supine Hamstring Stretch with Strap  - 1 x daily - 7 x weekly - 1 sets - 2 reps - 30 hold - Supine Figure 4 Piriformis Stretch  - 1 x daily - 7 x weekly - 1 sets - 2 reps - 30 hold - Supine Piriformis Stretch with Foot on Ground  - 1 x daily - 7 x weekly - 1 sets - 2 reps - 30 hold - Standing Quad Stretch with Table and Chair Support  - 1 x daily - 7 x weekly - 1 sets - 2 reps - 30 hold  ASSESSMENT:  CLINICAL IMPRESSION: Patient is a 81 y.o. male who was seen today for physical therapy evaluation and treatment for lumbar stenosis. He presents with decreased ROM, core strength and function along with elevated pain with limited ability to be active and must lie down in bed for half the day if he tries to do his usual functional tasks.  He would benefit from LE flexibility and core strengthening to restore patient to prior functional level.    OBJECTIVE IMPAIRMENTS: decreased activity tolerance, decreased mobility, difficulty  walking, decreased ROM, decreased strength, hypomobility, increased fascial restrictions, increased muscle spasms, impaired flexibility, improper body mechanics, postural dysfunction, and pain.   ACTIVITY LIMITATIONS: carrying, lifting, bending, sitting, standing, squatting, sleeping, stairs, transfers, bed mobility, and dressing  PARTICIPATION LIMITATIONS: meal prep, cleaning, laundry, driving, shopping, community activity, and yard work  PERSONAL FACTORS: Age, Fitness, Past/current experiences, Profession, Time since onset of injury/illness/exacerbation, and 1-2 comorbidities: anxiety, macular degeneration  are also affecting patient's functional outcome.   REHAB POTENTIAL: Good  CLINICAL DECISION MAKING: Stable/uncomplicated  EVALUATION COMPLEXITY: Low   GOALS: Goals reviewed with patient? Yes  SHORT TERM GOALS: Target date: 07/02/2022    Pain report to be no greater than 4/10  Baseline: Goal status: INITIAL  2.  Patient will be independent with initial HEP  Baseline:  Goal status: INITIAL   LONG TERM GOALS: Target date: 07/30/2022    Patient to report pain no greater than 2/10  Baseline:  Goal status: INITIAL  2.  Patient to be independent with advanced HEP  Baseline:  Goal status: INITIAL  3.  FOTO to improve to 58 Baseline: 48 Goal status: INITIAL  4.  5 times sit to stand to improve by 10 sec Baseline:  Goal status: INITIAL  5.  TUG score to improve by 3-5 seconds Baseline:  Goal status: INITIAL  6.  Patient to report 85% improvement in overall symptoms  Baseline:  Goal status: INITIAL  PLAN:  PT FREQUENCY: 1-2x/week  PT DURATION: 8 weeks  PLANNED INTERVENTIONS: Therapeutic exercises, Therapeutic activity, Neuromuscular re-education, Balance training, Gait training, Patient/Family education, Self Care, Joint mobilization, Stair training, Aquatic Therapy, Dry Needling, Electrical stimulation, Spinal mobilization, Cryotherapy, Moist heat, Taping,  Traction, Ultrasound, Ionotophoresis 4mg /ml Dexamethasone, Manual therapy, and Re-evaluation.  PLAN FOR NEXT SESSION: Review HEP, Nustep, DN if indicated and patient consents, begin core strengthening   Marquest Gunkel B. Nell Gales, PT 06/04/22 9:58 AM  [facility] [address] [place], [place] Phone # [phone] Fax [phone]

## 2022-06-06 ENCOUNTER — Ambulatory Visit: Payer: Medicare Other | Admitting: Physical Therapy

## 2022-06-06 DIAGNOSIS — R293 Abnormal posture: Secondary | ICD-10-CM

## 2022-06-06 DIAGNOSIS — R262 Difficulty in walking, not elsewhere classified: Secondary | ICD-10-CM | POA: Diagnosis not present

## 2022-06-06 DIAGNOSIS — M5459 Other low back pain: Secondary | ICD-10-CM

## 2022-06-06 DIAGNOSIS — M6281 Muscle weakness (generalized): Secondary | ICD-10-CM | POA: Diagnosis not present

## 2022-06-06 DIAGNOSIS — M4726 Other spondylosis with radiculopathy, lumbar region: Secondary | ICD-10-CM | POA: Diagnosis not present

## 2022-06-06 DIAGNOSIS — R252 Cramp and spasm: Secondary | ICD-10-CM

## 2022-06-06 NOTE — Therapy (Signed)
OUTPATIENT PHYSICAL THERAPY THORACOLUMBAR PROGRESS NOTE   Patient Name: Jonathon [name] MRN: [id number] DOB:1940-12-18, 81 y.o., male Today's Date: 06/06/2022  END OF SESSION:  PT End of Session - 06/06/22 0757     Visit Number 2    Date for PT Re-Evaluation 07/30/22    Authorization Type [employer] MEDICARE    Progress Note Due on Visit 10    PT Start Time 0759    PT Stop Time 0840    PT Time Calculation (min) 41 min    Activity Tolerance Patient tolerated treatment well             Past Medical History:  Diagnosis Date   Anxiety    never tried on SSRI. xanax 0.5mg  BID through Dr. Doristine Counter.    DDD (degenerative disc disease), lumbar    lumbar DDD- follows with orthopedist Dr. Leron Croak ortho. mobic 15mg  daily and hydrocodone 5/325 prn   Family history of breast cancer    HLD (hyperlipidemia)    Macular degeneration 07/21/2016   Follows with optho- areds2. He states he is unsure of diagnosis.    Neuropathy    unknown cause ? back related(sees ortho for back). burning sensation in feet. gabapentin 100mg  after dinner, 2 before bed   Smoker    8-10 cigarettes a day. plus nicorette. 60 years. over 50 pack years   Venous stasis     in the past- Wound center on legs. Elevate. Compression stockings.    Past Surgical History:  Procedure Laterality Date   ESOPHAGOGASTRODUODENOSCOPY Left 05/28/2016    Surgeon: , MD; food stuck in esophagus   LUMBAR LAMINECTOMY/DECOMPRESSION MICRODISCECTOMY Left 09/13/2020   Procedure: Left Lumbar four-five Laminectomy for facet/synovial cyst;  Surgeon: Charna Elizabeth, MD;  Location: [facility] OR;  Service: Neurosurgery;  Laterality: Left;   Patient Active Problem List   Diagnosis Date Noted   Emphysema lung (HCC) 12/03/2020   Lumbar radiculopathy, chronic 09/13/2020   Degenerative lumbar spinal stenosis 04/06/2019   OSA on CPAP 12/03/2017   Jock itch 07/31/2017   Hyperlipidemia 01/28/2017   Pulmonary nodules 12/26/2016   Aortic  atherosclerosis (HCC) 10/28/2016   Genetic testing 10/14/2016   Family history of colon cancer 10/02/2016   Family history of breast cancer    Hilar lymphadenopathy 09/23/2016   Lymph nodes enlarged 09/23/2016   Family history of genetic disease 08/28/2016   Macular degeneration 07/21/2016   Neuropathy    DDD (degenerative disc disease), lumbar    GAD (generalized anxiety disorder)    Cigarette smoker     PCP: 08/30/2016, MD   REFERRING PROVIDER: 09/18/2016, MD  REFERRING DIAG: [phone] (ICD-10-CM) - Other spondylosis with radiculopathy, lumbar region  Rationale for Evaluation and Treatment: Rehabilitation  THERAPY DIAG:  Other low back pain  Abnormal posture  Cramp and spasm  ONSET DATE: 06/02/2022  SUBJECTIVE:  SUBJECTIVE STATEMENT: No back pain this morning.  At 12:00 pain will give out.     PERTINENT HISTORY:  Previous cyst removal by Dr. Danielle Dess  PAIN:  Are you having pain? Yes: NPRS scale: 0/10 Pain location: midline low back Pain description: aching Aggravating factors: standing, bending, stooping and squatting  Relieving factors: medication, ice, heat  PRECAUTIONS: None  WEIGHT BEARING RESTRICTIONS: No  FALLS:  Has patient fallen in last 6 months? No  LIVING ENVIRONMENT: Lives with: lives with their spouse Lives in: House/apartment  OCCUPATION: retired  PLOF: Independent, Independent with basic ADLs, Independent with household mobility without device, Independent with community mobility without device, Independent with homemaking with ambulation, Independent with gait, and Independent with transfers  PATIENT GOALS: to be able to bend, stoop and squat and do my usual outdoor work without pain so bad that I have to lay in bed the rest of the day  NEXT MD  VISIT: prn  OBJECTIVE:   DIAGNOSTIC FINDINGS:  IMPRESSION: 1. Transitional anatomy with lumbarization of the S1 vertebral body. In keeping with prior reports, the lowest fully formed disc space is designated S1-S2. 2. Facet arthropathy most advanced at L4-L5 and L5-S1. 3. Mild-to-moderate right and mild left neural foraminal stenosis at L3-L4, not significantly changed. 4. At L4-L5, there is moderate spinal canal stenosis with right worse than left subarticular zone narrowing and mild-to-moderate left worse than right neural foraminal stenosis, not significantly changed since 2020. 5. At L5-S1, there is moderate spinal canal stenosis with bilateral subarticular zone narrowing and potential irritation of either traversing S1 nerve root, left worse than right, and mild-to-moderate left and mild right neural foraminal stenosis at L5-S1. The spinal canal and left subarticular zone narrowing at L5-S1 appear improved compared to the study from 2020. 6. Left worse than right subarticular zone narrowing at S1-S2 with abutment of the traversing S2 nerve roots, not significantly changed. 7. Levocurvature centered at L3 with associated right eccentric disc degeneration and degenerative endplate change at L3-L4, unchanged.      PATIENT SURVEYS:  FOTO 48 (goal is 56)  SCREENING FOR RED FLAGS: Bowel or bladder incontinence: No Spinal tumors: No Cauda equina syndrome: No Compression fracture: No Abdominal aneurysm: No  COGNITION: Overall cognitive status: Within functional limits for tasks assessed     SENSATION: WFL  MUSCLE LENGTH: Hamstrings: Right 45 deg; Left 50 deg Thomas test: Right pos ; Left slightly pos   POSTURE:  MRI notes mild scoliosis acquired   LUMBAR ROM:   AROM eval  Flexion WNL  Extension 75%  Right lateral flexion To joint line  Left lateral flexion To just above joint line  Right rotation 50%  Left rotation 50%   (Blank rows = not tested)  LOWER  EXTREMITY ROM:     WFL  LOWER EXTREMITY MMT:    Generally 4+ to 5/5 throughout  LUMBAR SPECIAL TESTS:  Straight leg raise test: Negative  FUNCTIONAL TESTS:  5 times sit to stand: 28.68 sec  Timed up and go (TUG): 13.80 sec  GAIT: Distance walked: 30 Assistive device utilized: None Level of assistance: Complete Independence Comments: shuffle, poor foot clearance, antalgic  TODAY'S TREATMENT:   DATE: 06/06/22 Stenosis education Seated arch with inhale and exhale to slump on hands Seated long sit on edge of table ankle DF 10x right/left  SKTC 3x right/left  DKTC 2x  Supine piriformis stretch 2 ways from HEP SLS with strap 30 sec hold 3x right/left per HEP Lumbar rotation 3x each way  with arms in T and head turned to the opposite the side Leaning on counter hip extension bent knee 10x right/left Nu-Step L1 6 min (old model seat 11, arms 12) Therapeutic activities:  sit to stand, standing, walking,                                                                                                                                  DATE: 06/04/22  Initial eval completed and initiated HEP   PATIENT EDUCATION:  Education details: Initiated HEP Person educated: Patient Education method: Programmer, multimedia, Facilities manager, Verbal cues, and Handouts Education comprehension: verbalized understanding, returned demonstration, and verbal cues required  HOME EXERCISE PROGRAM: Access Code: [email or web address] URL: [email or web address] Date: 06/06/2022 Prepared by: Lavinia Sharps  Exercises - Supine Hamstring Stretch with Strap  - 1 x daily - 7 x weekly - 1 sets - 2 reps - 30 hold - Supine Figure 4 Piriformis Stretch  - 1 x daily - 7 x weekly - 1 sets - 2 reps - 30 hold - Supine Piriformis Stretch with Foot on Ground  - 1 x daily - 7 x weekly - 1 sets - 2 reps - 30 hold - Standing Quad Stretch with Table and Chair Support  - 1 x daily - 7 x weekly - 1 sets - 2 reps - 30 hold - Seated  Cat Cow  - 1 x daily - 7 x weekly - 1 sets - 10 reps - Seated Table Hamstring Stretch  - 1 x daily - 7 x weekly - 1 sets - 10 reps - Supine Single Knee to Chest Stretch  - 1 x daily - 7 x weekly - 1 sets - 2 reps - 30 hold - Supine Double Knee to Chest  - 1 x daily - 7 x weekly - 1 sets - 1 reps - 30 hold   ASSESSMENT:  CLINICAL IMPRESSION: Decreased hip flexor, HS and gastroc lengths noted bilaterally.  Verbal cues to avoid compensatory flexed knee with HS stretches. Therapist monitoring response and modifying treatment accordingly.  He denies production of back pain during session but can definitely feel the pulling.      OBJECTIVE IMPAIRMENTS: decreased activity tolerance, decreased mobility, difficulty walking, decreased ROM, decreased strength, hypomobility, increased fascial restrictions, increased muscle spasms, impaired flexibility, improper body mechanics, postural dysfunction, and pain.   ACTIVITY LIMITATIONS: carrying, lifting, bending, sitting, standing, squatting, sleeping, stairs, transfers, bed mobility, and dressing  PARTICIPATION LIMITATIONS: meal prep, cleaning, laundry, driving, shopping, community activity, and yard work  PERSONAL FACTORS: Age, Fitness, Past/current experiences, Profession, Time since onset of injury/illness/exacerbation, and 1-2 comorbidities: anxiety, macular degeneration  are also affecting patient's functional outcome.   REHAB POTENTIAL: Good  CLINICAL DECISION MAKING: Stable/uncomplicated  EVALUATION COMPLEXITY: Low   GOALS: Goals reviewed with patient? Yes  SHORT TERM GOALS: Target date: 07/02/2022    Pain report to be no greater than 4/10  Baseline: Goal status: INITIAL  2.  Patient will be independent with initial HEP  Baseline:  Goal status: INITIAL   LONG TERM GOALS: Target date: 07/30/2022    Patient to report pain no greater than 2/10  Baseline:  Goal status: INITIAL  2.  Patient to be independent with advanced HEP   Baseline:  Goal status: INITIAL  3.  FOTO to improve to 58 Baseline: 48 Goal status: INITIAL  4.  5 times sit to stand to improve by 10 sec Baseline:  Goal status: INITIAL  5.  TUG score to improve by 3-5 seconds Baseline:  Goal status: INITIAL  6.  Patient to report 85% improvement in overall symptoms  Baseline:  Goal status: INITIAL  PLAN:  PT FREQUENCY: 1-2x/week  PT DURATION: 8 weeks  PLANNED INTERVENTIONS: Therapeutic exercises, Therapeutic activity, Neuromuscular re-education, Balance training, Gait training, Patient/Family education, Self Care, Joint mobilization, Stair training, Aquatic Therapy, Dry Needling, Electrical stimulation, Spinal mobilization, Cryotherapy, Moist heat, Taping, Traction, Ultrasound, Ionotophoresis 4mg /ml Dexamethasone, Manual therapy, and Re-evaluation.  PLAN FOR NEXT SESSION: Review HEP, Nustep, (did not discuss today) DN if indicated, begin core strengthening   Lavinia SharpsStacy Bentli Llorente, PT 06/06/22 8:47 AM Phone: [phone] Fax: [phone]  [facility] [address], [address] [place], [place] [address] Phone # [phone] Fax [phone]

## 2022-06-13 ENCOUNTER — Encounter: Payer: Self-pay | Admitting: Physical Therapy

## 2022-06-13 ENCOUNTER — Ambulatory Visit: Payer: Medicare Other | Admitting: Physical Therapy

## 2022-06-13 DIAGNOSIS — R293 Abnormal posture: Secondary | ICD-10-CM | POA: Diagnosis not present

## 2022-06-13 DIAGNOSIS — M4726 Other spondylosis with radiculopathy, lumbar region: Secondary | ICD-10-CM | POA: Diagnosis not present

## 2022-06-13 DIAGNOSIS — M6281 Muscle weakness (generalized): Secondary | ICD-10-CM | POA: Diagnosis not present

## 2022-06-13 DIAGNOSIS — R262 Difficulty in walking, not elsewhere classified: Secondary | ICD-10-CM | POA: Diagnosis not present

## 2022-06-13 DIAGNOSIS — M5459 Other low back pain: Secondary | ICD-10-CM

## 2022-06-13 DIAGNOSIS — R252 Cramp and spasm: Secondary | ICD-10-CM

## 2022-06-13 NOTE — Therapy (Signed)
OUTPATIENT PHYSICAL THERAPY THORACOLUMBAR PROGRESS NOTE   Patient Name: Jonathon [name] MRN: [id number] DOB:[birth year], 81 y.o., male Today's Date: 06/13/2022  END OF SESSION:  PT End of Session - 06/13/22 0829     Visit Number 3    Date for PT Re-Evaluation 07/30/22    Authorization Type BCBS MEDICARE    Progress Note Due on Visit 10    PT Start Time 0805    PT Stop Time [id number]    PT Time Calculation (min) 38 min    Activity Tolerance Patient tolerated treatment well    Behavior During Therapy WFL for tasks assessed/performed              Past Medical History:  Diagnosis Date   Anxiety    never tried on SSRI. xanax 0.5mg  BID through Dr. Doristine Counter.    DDD (degenerative disc disease), lumbar    lumbar DDD- follows with orthopedist Dr. Leron Croak ortho. mobic 15mg  daily and hydrocodone 5/325 prn   Family history of breast cancer    HLD (hyperlipidemia)    Macular degeneration 07/21/2016   Follows with optho- areds2. He states he is unsure of diagnosis.    Neuropathy    unknown cause ? back related(sees ortho for back). burning sensation in feet. gabapentin 100mg  after dinner, 2 before bed   Smoker    8-10 cigarettes a day. plus nicorette. 60 years. over 50 pack years   Venous stasis     in the past- Wound center on legs. Elevate. Compression stockings.    Past Surgical History:  Procedure Laterality Date   ESOPHAGOGASTRODUODENOSCOPY Left 05/28/2016    Surgeon: , MD; food stuck in esophagus   LUMBAR LAMINECTOMY/DECOMPRESSION MICRODISCECTOMY Left 09/13/2020   Procedure: Left Lumbar four-five Laminectomy for facet/synovial cyst;  Surgeon: Charna Elizabeth, MD;  Location: [facility] OR;  Service: Neurosurgery;  Laterality: Left;   Patient Active Problem List   Diagnosis Date Noted   Emphysema lung (HCC) 12/03/2020   Lumbar radiculopathy, chronic 09/13/2020   Degenerative lumbar spinal stenosis 04/06/2019   OSA on CPAP 12/03/2017   Jock itch 07/31/2017    Hyperlipidemia 01/28/2017   Pulmonary nodules 12/26/2016   Aortic atherosclerosis (HCC) 10/28/2016   Genetic testing 10/14/2016   Family history of colon cancer 10/02/2016   Family history of breast cancer    Hilar lymphadenopathy 09/23/2016   Lymph nodes enlarged 09/23/2016   Family history of genetic disease 08/28/2016   Macular degeneration 07/21/2016   Neuropathy    DDD (degenerative disc disease), lumbar    GAD (generalized anxiety disorder)    Cigarette smoker     PCP: 08/30/2016, MD   REFERRING PROVIDER: 09/18/2016, MD  REFERRING DIAG: [phone] (ICD-10-CM) - Other spondylosis with radiculopathy, lumbar region  Rationale for Evaluation and Treatment: Rehabilitation  THERAPY DIAG:  Other low back pain  Abnormal posture  Cramp and spasm  Muscle weakness (generalized)  Difficulty in walking, not elsewhere classified  ONSET DATE: 06/02/2022  SUBJECTIVE:  SUBJECTIVE STATEMENT: I am feeling just fine this AM. No pain  PERTINENT HISTORY:  Previous cyst removal by Dr. Danielle Dess  PAIN:  Are you having pain? Yes: NPRS scale: 0/10 Pain location: midline low back Pain description: aching Aggravating factors: standing, bending, stooping and squatting  Relieving factors: medication, ice, heat  PRECAUTIONS: None  WEIGHT BEARING RESTRICTIONS: No  FALLS:  Has patient fallen in last 6 months? No  LIVING ENVIRONMENT: Lives with: lives with their spouse Lives in: House/apartment  OCCUPATION: retired  PLOF: Independent, Independent with basic ADLs, Independent with household mobility without device, Independent with community mobility without device, Independent with homemaking with ambulation, Independent with gait, and Independent with transfers  PATIENT GOALS: to be able to  bend, stoop and squat and do my usual outdoor work without pain so bad that I have to lay in bed the rest of the day  NEXT MD VISIT: prn  OBJECTIVE:   DIAGNOSTIC FINDINGS:  IMPRESSION: 1. Transitional anatomy with lumbarization of the S1 vertebral body. In keeping with prior reports, the lowest fully formed disc space is designated S1-S2. 2. Facet arthropathy most advanced at L4-L5 and L5-S1. 3. Mild-to-moderate right and mild left neural foraminal stenosis at L3-L4, not significantly changed. 4. At L4-L5, there is moderate spinal canal stenosis with right worse than left subarticular zone narrowing and mild-to-moderate left worse than right neural foraminal stenosis, not significantly changed since 2020. 5. At L5-S1, there is moderate spinal canal stenosis with bilateral subarticular zone narrowing and potential irritation of either traversing S1 nerve root, left worse than right, and mild-to-moderate left and mild right neural foraminal stenosis at L5-S1. The spinal canal and left subarticular zone narrowing at L5-S1 appear improved compared to the study from 2020. 6. Left worse than right subarticular zone narrowing at S1-S2 with abutment of the traversing S2 nerve roots, not significantly changed. 7. Levocurvature centered at L3 with associated right eccentric disc degeneration and degenerative endplate change at L3-L4, unchanged.      PATIENT SURVEYS:  FOTO 48 (goal is 46)  SCREENING FOR RED FLAGS: Bowel or bladder incontinence: No Spinal tumors: No Cauda equina syndrome: No Compression fracture: No Abdominal aneurysm: No  COGNITION: Overall cognitive status: Within functional limits for tasks assessed     SENSATION: WFL  MUSCLE LENGTH: Hamstrings: Right 45 deg; Left 50 deg Thomas test: Right pos ; Left slightly pos   POSTURE:  MRI notes mild scoliosis acquired   LUMBAR ROM:   AROM eval  Flexion WNL  Extension 75%  Right lateral flexion To joint line   Left lateral flexion To just above joint line  Right rotation 50%  Left rotation 50%   (Blank rows = not tested)  LOWER EXTREMITY ROM:     WFL  LOWER EXTREMITY MMT:    Generally 4+ to 5/5 throughout  LUMBAR SPECIAL TESTS:  Straight leg raise test: Negative  FUNCTIONAL TESTS:  5 times sit to stand: 28.68 sec  Timed up and go (TUG): 13.80 sec  GAIT: Distance walked: 30 Assistive device utilized: None Level of assistance: Complete Independence Comments: shuffle, poor foot clearance, antalgic  TODAY'S TREATMENT:    06/13/22: Nustep L1 5 min Seated hamstring stretch 3x 10 sec Single knee to chest Bil 3x 10 sec  Lower trunk rotation 10x each side Hip cross over stretch 3x Bil 10 sec holds Co-contraction of ball squeeze and glute squeeze. 3 sec hold 5x.  Small bridge 3 sec hold 5x Sit to stand 5x 1 cushion on  mat table No UE Standing calf raise at [facility] 10x Hip abduction standing at [facility] 5x Bil, VC to not bend the other way  DATE: 06/06/22 Stenosis education Seated arch with inhale and exhale to slump on hands Seated long sit on edge of table ankle DF 10x right/left  SKTC 3x right/left  DKTC 2x  Supine piriformis stretch 2 ways from HEP SLS with strap 30 sec hold 3x right/left per HEP Lumbar rotation 3x each way with arms in T and head turned to the opposite the side Leaning on counter hip extension bent knee 10x right/left Nu-Step L1 6 min (old model seat 11, arms 12) Therapeutic activities:  sit to stand, standing, walking,                                                                                                                                  DATE: 06/04/22  Initial eval completed and initiated HEP   PATIENT EDUCATION:  Education details: Initiated HEP Person educated: Patient Education method: Programmer, multimedia, Facilities manager, Verbal cues, and Handouts Education comprehension: verbalized understanding, returned demonstration, and verbal cues  required  HOME EXERCISE PROGRAM: Access Code: [email or web address] URL: [email or web address] Date: 06/06/2022 Prepared by: Lavinia Sharps  Exercises - Supine Hamstring Stretch with Strap  - 1 x daily - 7 x weekly - 1 sets - 2 reps - 30 hold - Supine Figure 4 Piriformis Stretch  - 1 x daily - 7 x weekly - 1 sets - 2 reps - 30 hold - Supine Piriformis Stretch with Foot on Ground  - 1 x daily - 7 x weekly - 1 sets - 2 reps - 30 hold - Standing Quad Stretch with Table and Chair Support  - 1 x daily - 7 x weekly - 1 sets - 2 reps - 30 hold - Seated Cat Cow  - 1 x daily - 7 x weekly - 1 sets - 10 reps - Seated Table Hamstring Stretch  - 1 x daily - 7 x weekly - 1 sets - 10 reps - Supine Single Knee to Chest Stretch  - 1 x daily - 7 x weekly - 1 sets - 2 reps - 30 hold - Supine Double Knee to Chest  - 1 x daily - 7 x weekly - 1 sets - 1 reps - 30 hold   ASSESSMENT:  CLINICAL IMPRESSION: Pt arrives this AM with no back pain. Pt reports he is doing "some of his stretches." Pt demonstrates poor endurance holding a muscular contraction > 3 sec.       OBJECTIVE IMPAIRMENTS: decreased activity tolerance, decreased mobility, difficulty walking, decreased ROM, decreased strength, hypomobility, increased fascial restrictions, increased muscle spasms, impaired flexibility, improper body mechanics, postural dysfunction, and pain.   ACTIVITY LIMITATIONS: carrying, lifting, bending, sitting, standing, squatting, sleeping, stairs, transfers, bed mobility, and dressing  PARTICIPATION LIMITATIONS: meal prep, cleaning, laundry, driving, shopping, community activity, and yard work  PERSONAL FACTORS: Age, Fitness, Past/current  experiences, Profession, Time since onset of injury/illness/exacerbation, and 1-2 comorbidities: anxiety, macular degeneration  are also affecting patient's functional outcome.   REHAB POTENTIAL: Good  CLINICAL DECISION MAKING: Stable/uncomplicated  EVALUATION COMPLEXITY:  Low   GOALS: Goals reviewed with patient? Yes  SHORT TERM GOALS: Target date: 07/02/2022    Pain report to be no greater than 4/10  Baseline: Goal status: INITIAL  2.  Patient will be independent with initial HEP  Baseline:  Goal status: INITIAL   LONG TERM GOALS: Target date: 07/30/2022    Patient to report pain no greater than 2/10  Baseline:  Goal status: INITIAL  2.  Patient to be independent with advanced HEP  Baseline:  Goal status: INITIAL  3.  FOTO to improve to 58 Baseline: 48 Goal status: INITIAL  4.  5 times sit to stand to improve by 10 sec Baseline:  Goal status: INITIAL  5.  TUG score to improve by 3-5 seconds Baseline:  Goal status: INITIAL  6.  Patient to report 85% improvement in overall symptoms  Baseline:  Goal status: INITIAL  PLAN:  PT FREQUENCY: 1-2x/week  PT DURATION: 8 weeks  PLANNED INTERVENTIONS: Therapeutic exercises, Therapeutic activity, Neuromuscular re-education, Balance training, Gait training, Patient/Family education, Self Care, Joint mobilization, Stair training, Aquatic Therapy, Dry Needling, Electrical stimulation, Spinal mobilization, Cryotherapy, Moist heat, Taping, Traction, Ultrasound, Ionotophoresis 4mg /ml Dexamethasone, Manual therapy, and Re-evaluation.  PLAN FOR NEXT SESSION:  Core, hip, E strength and endurance    Ane PaymentJennifer Shekia Kuper, PTA 06/13/22 8:44 AM   Phone: [phone] Fax: [phone]  [facility] [address], [address] [place], [place] [address] Phone # [phone] Fax [phone]

## 2022-06-18 ENCOUNTER — Ambulatory Visit: Payer: Medicare Other | Attending: Neurological Surgery | Admitting: Physical Therapy

## 2022-06-18 DIAGNOSIS — R262 Difficulty in walking, not elsewhere classified: Secondary | ICD-10-CM | POA: Diagnosis not present

## 2022-06-18 DIAGNOSIS — M5459 Other low back pain: Secondary | ICD-10-CM

## 2022-06-18 DIAGNOSIS — M6281 Muscle weakness (generalized): Secondary | ICD-10-CM | POA: Diagnosis not present

## 2022-06-18 DIAGNOSIS — R293 Abnormal posture: Secondary | ICD-10-CM

## 2022-06-18 DIAGNOSIS — R252 Cramp and spasm: Secondary | ICD-10-CM | POA: Insufficient documentation

## 2022-06-18 NOTE — Therapy (Signed)
OUTPATIENT PHYSICAL THERAPY THORACOLUMBAR PROGRESS NOTE   Patient Name: Jonathon [name] MRN: [id number] DOB:[birth year], 82 y.o., male Today's Date: 06/18/2022  END OF SESSION:  PT End of Session - 06/18/22 0759     Visit Number 4    Date for PT Re-Evaluation 07/30/22    Authorization Type BCBS MEDICARE    PT Start Time 0800    PT Stop Time 0840    PT Time Calculation (min) 40 min    Activity Tolerance Patient tolerated treatment well              Past Medical History:  Diagnosis Date   Anxiety    never tried on SSRI. xanax 0.5mg  BID through Dr. Tollie Pizza.    DDD (degenerative disc disease), lumbar    lumbar DDD- follows with orthopedist Dr. Linus Orn ortho. mobic 15mg  daily and hydrocodone 5/325 prn   Family history of breast cancer    HLD (hyperlipidemia)    Macular degeneration 07/21/2016   Follows with optho- areds2. He states he is unsure of diagnosis.    Neuropathy    unknown cause ? back related(sees ortho for back). burning sensation in feet. gabapentin 100mg  after dinner, 2 before bed   Smoker    8-10 cigarettes a day. plus nicorette. 60 years. over 50 pack years   Venous stasis     in the past- Wound center on legs. [facility]. Compression stockings.    Past Surgical History:  Procedure Laterality Date   ESOPHAGOGASTRODUODENOSCOPY Left 05/28/2016    Surgeon: Juanita Craver, MD; food stuck in esophagus   LUMBAR LAMINECTOMY/DECOMPRESSION MICRODISCECTOMY Left 09/13/2020   Procedure: Left Lumbar four-five Laminectomy for facet/synovial cyst;  Surgeon: Kristeen Miss, MD;  Location: [place];  Service: Neurosurgery;  Laterality: Left;   Patient Active Problem List   Diagnosis Date Noted   Emphysema lung ([place]) 12/03/2020   Lumbar radiculopathy, chronic 09/13/2020   Degenerative lumbar spinal stenosis 04/06/2019   OSA on CPAP 12/03/2017   Jock itch 07/31/2017   Hyperlipidemia 01/28/2017   Pulmonary nodules 12/26/2016   Aortic atherosclerosis ([place]) 10/28/2016    Genetic testing 10/14/2016   Family history of colon cancer 10/02/2016   Family history of breast cancer    Hilar lymphadenopathy 09/23/2016   Lymph nodes enlarged 09/23/2016   Family history of genetic disease 08/28/2016   Macular degeneration 07/21/2016   Neuropathy    DDD (degenerative disc disease), lumbar    GAD (generalized anxiety disorder)    Cigarette smoker     PCP: Marin Olp, MD   REFERRING PROVIDER: Kristeen Miss, MD  REFERRING DIAG: [phone] (ICD-10-CM) - Other spondylosis with radiculopathy, lumbar region  Rationale for Evaluation and Treatment: Rehabilitation  THERAPY DIAG:  Other low back pain  Abnormal posture  Muscle weakness (generalized)  ONSET DATE: 06/02/2022  SUBJECTIVE:  SUBJECTIVE STATEMENT: I'm good early in the morning until noon.  No pain this morning.  Everything is going fine with therapy.    PERTINENT HISTORY:  Previous cyst removal by Dr. Danielle Dess  PAIN:  Are you having pain? Yes: NPRS scale: 0/10 Pain location: midline low back Pain description: aching Aggravating factors: standing, bending, stooping and squatting  Relieving factors: medication, ice, heat  PRECAUTIONS: None  WEIGHT BEARING RESTRICTIONS: No  FALLS:  Has patient fallen in last 6 months? No  LIVING ENVIRONMENT: Lives with: lives with their spouse Lives in: House/apartment  OCCUPATION: retired  PLOF: Independent, Independent with basic ADLs, Independent with household mobility without device, Independent with community mobility without device, Independent with homemaking with ambulation, Independent with gait, and Independent with transfers  PATIENT GOALS: to be able to bend, stoop and squat and do my usual outdoor work without pain so bad that I have to lay in bed the rest  of the day  NEXT MD VISIT: prn  OBJECTIVE:   DIAGNOSTIC FINDINGS:  IMPRESSION: 1. Transitional anatomy with lumbarization of the S1 vertebral body. In keeping with prior reports, the lowest fully formed disc space is designated S1-S2. 2. Facet arthropathy most advanced at L4-L5 and L5-S1. 3. Mild-to-moderate right and mild left neural foraminal stenosis at L3-L4, not significantly changed. 4. At L4-L5, there is moderate spinal canal stenosis with right worse than left subarticular zone narrowing and mild-to-moderate left worse than right neural foraminal stenosis, not significantly changed since 2020. 5. At L5-S1, there is moderate spinal canal stenosis with bilateral subarticular zone narrowing and potential irritation of either traversing S1 nerve root, left worse than right, and mild-to-moderate left and mild right neural foraminal stenosis at L5-S1. The spinal canal and left subarticular zone narrowing at L5-S1 appear improved compared to the study from 2020. 6. Left worse than right subarticular zone narrowing at S1-S2 with abutment of the traversing S2 nerve roots, not significantly changed. 7. Levocurvature centered at L3 with associated right eccentric disc degeneration and degenerative endplate change at L3-L4, unchanged.      PATIENT SURVEYS:  FOTO 48 (goal is 73)  SCREENING FOR RED FLAGS: Bowel or bladder incontinence: No Spinal tumors: No Cauda equina syndrome: No Compression fracture: No Abdominal aneurysm: No  COGNITION: Overall cognitive status: Within functional limits for tasks assessed     SENSATION: WFL  MUSCLE LENGTH: Hamstrings: Right 45 deg; Left 50 deg Thomas test: Right pos ; Left slightly pos   POSTURE:  MRI notes mild scoliosis acquired   LUMBAR ROM:   AROM eval  Flexion WNL  Extension 75%  Right lateral flexion To joint line  Left lateral flexion To just above joint line  Right rotation 50%  Left rotation 50%   (Blank rows =  not tested)  LOWER EXTREMITY ROM:     WFL  LOWER EXTREMITY MMT:    Generally 4+ to 5/5 throughout  LUMBAR SPECIAL TESTS:  Straight leg raise test: Negative  FUNCTIONAL TESTS:  5 times sit to stand: 28.68 sec  Timed up and go (TUG): 13.80 sec  GAIT: Distance walked: 30 Assistive device utilized: None Level of assistance: Complete Independence Comments: shuffle, poor foot clearance, antalgic  TODAY'S TREATMENT:   06/18/22: Nustep L3 old model seat 11, arms 12 full lap 6:10 min Seated long sit on edge of table ankle DF for neural mobility 10x right/left  Seated ball roll outs with exhale to encourage more stretch 3x forward and right/left 3x 2nd step calf stretch 5x  right/left  2nd step UE reach up and over 5x right/left Sit to stand 5x standard chair No UE 8# dumbbell modified dead lift to cone 2x5 cues to bend knees Standing calf raise at [facility] 10x Hip abduction standing at [facility] 10x Bil, VC to not bend the other way Golfers reach holding barre to touch tall cone on upright 4 inch step 10x each side  Therapeutic activities:  sit to stand, standing, walking,  lifting, pushing, pulling    06/13/22: Nustep L1 5 min Seated hamstring stretch 3x 10 sec Single knee to chest Bil 3x 10 sec  Lower trunk rotation 10x each side Hip cross over stretch 3x Bil 10 sec holds Co-contraction of ball squeeze and glute squeeze. 3 sec hold 5x.  Small bridge 3 sec hold 5x Sit to stand 5x 1 cushion on mat table No UE Standing calf raise at [facility] 10x Hip abduction standing at [facility] 5x Bil, VC to not bend the other way  DATE: 06/06/22 Stenosis education Seated arch with inhale and exhale to slump on hands Seated long sit on edge of table ankle DF 10x right/left  SKTC 3x right/left  DKTC 2x  Supine piriformis stretch 2 ways from HEP SLS with strap 30 sec hold 3x right/left per HEP Lumbar rotation 3x each way with arms in T and head turned to the opposite the side Leaning on counter hip  extension bent knee 10x right/left Nu-Step L1 6 min (old model seat 11, arms 12) Therapeutic activities:  sit to stand, standing, walking,     PATIENT EDUCATION:  Education details: Initiated HEP Person educated: Patient Education method: Consulting civil engineer, Media planner, Verbal cues, and Handouts Education comprehension: verbalized understanding, returned demonstration, and verbal cues required  HOME EXERCISE PROGRAM: Access Code: [email or web address] URL: [email or web address] Date: 06/06/2022 Prepared by: Ruben Im  Exercises - Supine Hamstring Stretch with Strap  - 1 x daily - 7 x weekly - 1 sets - 2 reps - 30 hold - Supine Figure 4 Piriformis Stretch  - 1 x daily - 7 x weekly - 1 sets - 2 reps - 30 hold - Supine Piriformis Stretch with Foot on Ground  - 1 x daily - 7 x weekly - 1 sets - 2 reps - 30 hold - Standing Quad Stretch with Table and Chair Support  - 1 x daily - 7 x weekly - 1 sets - 2 reps - 30 hold - Seated Cat Cow  - 1 x daily - 7 x weekly - 1 sets - 10 reps - Seated Table Hamstring Stretch  - 1 x daily - 7 x weekly - 1 sets - 10 reps - Supine Single Knee to Chest Stretch  - 1 x daily - 7 x weekly - 1 sets - 2 reps - 30 hold - Supine Double Knee to Chest  - 1 x daily - 7 x weekly - 1 sets - 1 reps - 30 hold   ASSESSMENT:  CLINICAL IMPRESSION:  Discussed techniques for bending to be easier on his back (bending knees more and golfer's lift for small things).  Verbal and tactile cues provided with performance.  Would benefit from further muscle lengthening/stretching of HS, gastroc and hip flexors.  Some discomfort reported at the end of session but he states his pain is usually delayed onset.     OBJECTIVE IMPAIRMENTS: decreased activity tolerance, decreased mobility, difficulty walking, decreased ROM, decreased strength, hypomobility, increased fascial restrictions, increased muscle spasms, impaired flexibility, improper body mechanics, postural dysfunction, and pain.  ACTIVITY LIMITATIONS: carrying, lifting, bending, sitting, standing, squatting, sleeping, stairs, transfers, bed mobility, and dressing  PARTICIPATION LIMITATIONS: meal prep, cleaning, laundry, driving, shopping, community activity, and yard work  PERSONAL FACTORS: Age, Fitness, Past/current experiences, Profession, Time since onset of injury/illness/exacerbation, and 1-2 comorbidities: anxiety, macular degeneration  are also affecting patient's functional outcome.   REHAB POTENTIAL: Good  CLINICAL DECISION MAKING: Stable/uncomplicated  EVALUATION COMPLEXITY: Low   GOALS: Goals reviewed with patient? Yes  SHORT TERM GOALS: Target date: 07/02/2022    Pain report to be no greater than 4/10  Baseline: Goal status: INITIAL  2.  Patient will be independent with initial HEP  Baseline:  Goal status: INITIAL   LONG TERM GOALS: Target date: 07/30/2022    Patient to report pain no greater than 2/10  Baseline:  Goal status: INITIAL  2.  Patient to be independent with advanced HEP  Baseline:  Goal status: INITIAL  3.  FOTO to improve to 58 Baseline: 48 Goal status: INITIAL  4.  5 times sit to stand to improve by 10 sec Baseline:  Goal status: INITIAL  5.  TUG score to improve by 3-5 seconds Baseline:  Goal status: INITIAL  6.  Patient to report 85% improvement in overall symptoms  Baseline:  Goal status: INITIAL  PLAN:  PT FREQUENCY: 1-2x/week  PT DURATION: 8 weeks  PLANNED INTERVENTIONS: Therapeutic exercises, Therapeutic activity, Neuromuscular re-education, Balance training, Gait training, Patient/Family education, Self Care, Joint mobilization, Stair training, Aquatic Therapy, Dry Needling, Electrical stimulation, Spinal mobilization, Cryotherapy, Moist heat, Taping, Traction, Ultrasound, Ionotophoresis 4mg /ml Dexamethasone, Manual therapy, and Re-evaluation.  PLAN FOR NEXT SESSION:  Core, hip,LE strength and endurance; review  hip hinge;  stretching HS, hip  flexors, gastroc  , PT 06/18/22 8:45 AM Phone: [phone] Fax: [phone]  Phone: [phone] Fax: [phone]  [facility] [address] [place], [place] Phone # [phone] Fax [phone]

## 2022-06-20 ENCOUNTER — Encounter: Payer: Self-pay | Admitting: Physical Therapy

## 2022-06-20 ENCOUNTER — Ambulatory Visit: Payer: Medicare Other | Admitting: Physical Therapy

## 2022-06-20 DIAGNOSIS — R262 Difficulty in walking, not elsewhere classified: Secondary | ICD-10-CM | POA: Diagnosis not present

## 2022-06-20 DIAGNOSIS — R252 Cramp and spasm: Secondary | ICD-10-CM

## 2022-06-20 DIAGNOSIS — M5459 Other low back pain: Secondary | ICD-10-CM | POA: Diagnosis not present

## 2022-06-20 DIAGNOSIS — R293 Abnormal posture: Secondary | ICD-10-CM | POA: Diagnosis not present

## 2022-06-20 DIAGNOSIS — M6281 Muscle weakness (generalized): Secondary | ICD-10-CM | POA: Diagnosis not present

## 2022-06-20 NOTE — Therapy (Signed)
OUTPATIENT PHYSICAL THERAPY THORACOLUMBAR PROGRESS NOTE   Patient Name: Jonathon [name] MRN: [id number] DOB:Feb 08, 1941, 82 y.o., male Today's Date: 06/20/2022  END OF SESSION:  PT End of Session - 06/20/22 0850     Visit Number 5    Date for PT Re-Evaluation 07/30/22    Authorization Type [employer] MEDICARE    Progress Note Due on Visit 10    PT Start Time 0848    PT Stop Time 0930    PT Time Calculation (min) 42 min    Activity Tolerance Patient tolerated treatment well    Behavior During Therapy WFL for tasks assessed/performed               Past Medical History:  Diagnosis Date   Anxiety    never tried on SSRI. xanax 0.5mg  BID through Dr. Doristine Counter.    DDD (degenerative disc disease), lumbar    lumbar DDD- follows with orthopedist Dr. Leron Croak ortho. mobic 15mg  daily and hydrocodone 5/325 prn   Family history of breast cancer    HLD (hyperlipidemia)    Macular degeneration 07/21/2016   Follows with optho- areds2. He states he is unsure of diagnosis.    Neuropathy    unknown cause ? back related(sees ortho for back). burning sensation in feet. gabapentin 100mg  after dinner, 2 before bed   Smoker    8-10 cigarettes a day. plus nicorette. 60 years. over 50 pack years   Venous stasis     in the past- Wound center on legs. Elevate. Compression stockings.    Past Surgical History:  Procedure Laterality Date   ESOPHAGOGASTRODUODENOSCOPY Left 05/28/2016    Surgeon: , MD; food stuck in esophagus   LUMBAR LAMINECTOMY/DECOMPRESSION MICRODISCECTOMY Left 09/13/2020   Procedure: Left Lumbar four-five Laminectomy for facet/synovial cyst;  Surgeon: Charna Elizabeth, MD;  Location: [facility] OR;  Service: Neurosurgery;  Laterality: Left;   Patient Active Problem List   Diagnosis Date Noted   Emphysema lung (HCC) 12/03/2020   Lumbar radiculopathy, chronic 09/13/2020   Degenerative lumbar spinal stenosis 04/06/2019   OSA on CPAP 12/03/2017   Jock itch 07/31/2017    Hyperlipidemia 01/28/2017   Pulmonary nodules 12/26/2016   Aortic atherosclerosis (HCC) 10/28/2016   Genetic testing 10/14/2016   Family history of colon cancer 10/02/2016   Family history of breast cancer    Hilar lymphadenopathy 09/23/2016   Lymph nodes enlarged 09/23/2016   Family history of genetic disease 08/28/2016   Macular degeneration 07/21/2016   Neuropathy    DDD (degenerative disc disease), lumbar    GAD (generalized anxiety disorder)    Cigarette smoker     PCP: 08/30/2016, MD   REFERRING PROVIDER: 09/18/2016, MD  REFERRING DIAG: [phone] (ICD-10-CM) - Other spondylosis with radiculopathy, lumbar region  Rationale for Evaluation and Treatment: Rehabilitation  THERAPY DIAG:  Other low back pain  Abnormal posture  Muscle weakness (generalized)  Cramp and spasm  ONSET DATE: 06/02/2022  SUBJECTIVE:  SUBJECTIVE STATEMENT: My back is good till anywhere between 97 & 1 in the afternoon.   PERTINENT HISTORY:  Previous cyst removal by Dr. Danielle Dess  PAIN:  Are you having pain? Yes: NPRS scale: 0/10 Pain location: midline low back Pain description: aching Aggravating factors: standing, bending, stooping and squatting  Relieving factors: medication, ice, heat  PRECAUTIONS: None  WEIGHT BEARING RESTRICTIONS: No  FALLS:  Has patient fallen in last 6 months? No  LIVING ENVIRONMENT: Lives with: lives with their spouse Lives in: House/apartment  OCCUPATION: retired  PLOF: Independent, Independent with basic ADLs, Independent with household mobility without device, Independent with community mobility without device, Independent with homemaking with ambulation, Independent with gait, and Independent with transfers  PATIENT GOALS: to be able to bend, stoop and squat and  do my usual outdoor work without pain so bad that I have to lay in bed the rest of the day  NEXT MD VISIT: prn  OBJECTIVE:   DIAGNOSTIC FINDINGS:  IMPRESSION: 1. Transitional anatomy with lumbarization of the S1 vertebral body. In keeping with prior reports, the lowest fully formed disc space is designated S1-S2. 2. Facet arthropathy most advanced at L4-L5 and L5-S1. 3. Mild-to-moderate right and mild left neural foraminal stenosis at L3-L4, not significantly changed. 4. At L4-L5, there is moderate spinal canal stenosis with right worse than left subarticular zone narrowing and mild-to-moderate left worse than right neural foraminal stenosis, not significantly changed since 2020. 5. At L5-S1, there is moderate spinal canal stenosis with bilateral subarticular zone narrowing and potential irritation of either traversing S1 nerve root, left worse than right, and mild-to-moderate left and mild right neural foraminal stenosis at L5-S1. The spinal canal and left subarticular zone narrowing at L5-S1 appear improved compared to the study from 2020. 6. Left worse than right subarticular zone narrowing at S1-S2 with abutment of the traversing S2 nerve roots, not significantly changed. 7. Levocurvature centered at L3 with associated right eccentric disc degeneration and degenerative endplate change at L3-L4, unchanged.      PATIENT SURVEYS:  FOTO 48 (goal is 59)  SCREENING FOR RED FLAGS: Bowel or bladder incontinence: No Spinal tumors: No Cauda equina syndrome: No Compression fracture: No Abdominal aneurysm: No  COGNITION: Overall cognitive status: Within functional limits for tasks assessed     SENSATION: WFL  MUSCLE LENGTH: Hamstrings: Right 45 deg; Left 50 deg Thomas test: Right pos ; Left slightly pos   POSTURE:  MRI notes mild scoliosis acquired   LUMBAR ROM:   AROM eval  Flexion WNL  Extension 75%  Right lateral flexion To joint line  Left lateral flexion To  just above joint line  Right rotation 50%  Left rotation 50%   (Blank rows = not tested)  LOWER EXTREMITY ROM:     WFL  LOWER EXTREMITY MMT:    Generally 4+ to 5/5 throughout  LUMBAR SPECIAL TESTS:  Straight leg raise test: Negative  FUNCTIONAL TESTS:  5 times sit to stand: 28.68 sec  Timed up and go (TUG): 13.80 sec  GAIT: Distance walked: 30 Assistive device utilized: None Level of assistance: Complete Independence Comments: shuffle, poor foot clearance, antalgic  TODAY'S TREATMENT:    06/20/21: Nustep new model L5 7 min with PTA present to discuss present status. Standing ankle DF/PF on rocker board 20x 2nd step calf stretch 10x right/left  Seated ball roll outs with exhale to encourage more stretch 3x forward and right/left 3x Sit to stand from ow mat table 10x no UE Modified dead  lifts holding 8# wts: lift to height of trampoline 10x Supine bridge 3 sec hold 10x Supine blue loop clamshells 15x Seated red band horizontal abd with Vc to sit tall 10x: then diagonals 10x Bil Tall walking /suitcase carry with 3# 3 laps: easy, pt can do more next session  06/18/22: Nustep L3 old model seat 11, arms 12 full lap 6:10 min Seated long sit on edge of table ankle DF for neural mobility 10x right/left  Seated ball roll outs with exhale to encourage more stretch 3x forward and right/left 3x 2nd step calf stretch 5x right/left  2nd step UE reach up and over 5x right/left Sit to stand 5x standard chair No UE 8# dumbbell modified dead lift to cone 2x5 cues to bend knees Standing calf raise at [facility] 10x Hip abduction standing at [facility] 10x Bil, VC to not bend the other way Golfers reach holding barre to touch tall cone on upright 4 inch step 10x each side  Therapeutic activities:  sit to stand, standing, walking,  lifting, pushing, pulling    06/13/22: Nustep L1 5 min Seated hamstring stretch 3x 10 sec Single knee to chest Bil 3x 10 sec  Lower trunk rotation 10x each side Hip  cross over stretch 3x Bil 10 sec holds Co-contraction of ball squeeze and glute squeeze. 3 sec hold 5x.  Small bridge 3 sec hold 5x Sit to stand 5x 1 cushion on mat table No UE Standing calf raise at [facility] 10x Hip abduction standing at [facility] 5x Bil, VC to not bend the other way  DATE: 06/06/22 Stenosis education Seated arch with inhale and exhale to slump on hands Seated long sit on edge of table ankle DF 10x right/left  SKTC 3x right/left  DKTC 2x  Supine piriformis stretch 2 ways from HEP SLS with strap 30 sec hold 3x right/left per HEP Lumbar rotation 3x each way with arms in T and head turned to the opposite the side Leaning on counter hip extension bent knee 10x right/left Nu-Step L1 6 min (old model seat 11, arms 12) Therapeutic activities:  sit to stand, standing, walking,     PATIENT EDUCATION:  Education details: Initiated HEP Person educated: Patient Education method: Consulting civil engineer, Media planner, Verbal cues, and Handouts Education comprehension: verbalized understanding, returned demonstration, and verbal cues required  HOME EXERCISE PROGRAM: Access Code: [email or web address] URL: [email or web address] Date: 06/06/2022 Prepared by: Ruben Im  Exercises - Supine Hamstring Stretch with Strap  - 1 x daily - 7 x weekly - 1 sets - 2 reps - 30 hold - Supine Figure 4 Piriformis Stretch  - 1 x daily - 7 x weekly - 1 sets - 2 reps - 30 hold - Supine Piriformis Stretch with Foot on Ground  - 1 x daily - 7 x weekly - 1 sets - 2 reps - 30 hold - Standing Quad Stretch with Table and Chair Support  - 1 x daily - 7 x weekly - 1 sets - 2 reps - 30 hold - Seated Cat Cow  - 1 x daily - 7 x weekly - 1 sets - 10 reps - Seated Table Hamstring Stretch  - 1 x daily - 7 x weekly - 1 sets - 10 reps - Supine Single Knee to Chest Stretch  - 1 x daily - 7 x weekly - 1 sets - 2 reps - 30 hold - Supine Double Knee to Chest  - 1 x daily - 7 x weekly - 1 sets - 1 reps - 30  hold   ASSESSMENT:  CLINICAL IMPRESSION:  Pt arrives with no pain. Pt reports some days he doesn't have back pain until 1 or 2 which is a slight improvement it is just not consistent. Pt had difficulty stretching LT calf an performing seated diagonals with red band. Muscle fatigues easily.  OBJECTIVE IMPAIRMENTS: decreased activity tolerance, decreased mobility, difficulty walking, decreased ROM, decreased strength, hypomobility, increased fascial restrictions, increased muscle spasms, impaired flexibility, improper body mechanics, postural dysfunction, and pain.   ACTIVITY LIMITATIONS: carrying, lifting, bending, sitting, standing, squatting, sleeping, stairs, transfers, bed mobility, and dressing  PARTICIPATION LIMITATIONS: meal prep, cleaning, laundry, driving, shopping, community activity, and yard work  PERSONAL FACTORS: Age, Fitness, Past/current experiences, Profession, Time since onset of injury/illness/exacerbation, and 1-2 comorbidities: anxiety, macular degeneration  are also affecting patient's functional outcome.   REHAB POTENTIAL: Good  CLINICAL DECISION MAKING: Stable/uncomplicated  EVALUATION COMPLEXITY: Low   GOALS: Goals reviewed with patient? Yes  SHORT TERM GOALS: Target date: 07/02/2022    Pain report to be no greater than 4/10  Baseline: Goal status: INITIAL  2.  Patient will be independent with initial HEP  Baseline:  Goal status: Goal met 06/20/21   LONG TERM GOALS: Target date: 07/30/2022    Patient to report pain no greater than 2/10  Baseline:  Goal status: INITIAL  2.  Patient to be independent with advanced HEP  Baseline:  Goal status: INITIAL  3.  FOTO to improve to 58 Baseline: 48 Goal status: INITIAL  4.  5 times sit to stand to improve by 10 sec Baseline:  Goal status: INITIAL  5.  TUG score to improve by 3-5 seconds Baseline:  Goal status: INITIAL  6.  Patient to report 85% improvement in overall symptoms  Baseline:  Goal  status: INITIAL  PLAN:  PT FREQUENCY: 1-2x/week  PT DURATION: 8 weeks  PLANNED INTERVENTIONS: Therapeutic exercises, Therapeutic activity, Neuromuscular re-education, Balance training, Gait training, Patient/Family education, Self Care, Joint mobilization, Stair training, Aquatic Therapy, Dry Needling, Electrical stimulation, Spinal mobilization, Cryotherapy, Moist heat, Taping, Traction, Ultrasound, Ionotophoresis 4mg /ml Dexamethasone, Manual therapy, and Re-evaluation.  PLAN FOR NEXT SESSION:  Core, hip,LE strength and endurance; review  hip hinge;  stretching HS, hip flexors, gastroc  Myrene Galas, PTA 06/20/22 9:29 AM   Phone: [phone] Fax: [phone]  Phone: [phone] Fax: [phone]  [facility] [address] [place] [address] [place], [place] [address] Phone # [phone] [phone]

## 2022-06-23 ENCOUNTER — Ambulatory Visit: Payer: Medicare Other | Admitting: Physical Therapy

## 2022-06-23 ENCOUNTER — Encounter: Payer: Self-pay | Admitting: Physical Therapy

## 2022-06-23 DIAGNOSIS — R293 Abnormal posture: Secondary | ICD-10-CM

## 2022-06-23 DIAGNOSIS — R252 Cramp and spasm: Secondary | ICD-10-CM | POA: Diagnosis not present

## 2022-06-23 DIAGNOSIS — M6281 Muscle weakness (generalized): Secondary | ICD-10-CM | POA: Diagnosis not present

## 2022-06-23 DIAGNOSIS — R262 Difficulty in walking, not elsewhere classified: Secondary | ICD-10-CM | POA: Diagnosis not present

## 2022-06-23 DIAGNOSIS — M5459 Other low back pain: Secondary | ICD-10-CM

## 2022-06-23 NOTE — Therapy (Signed)
OUTPATIENT PHYSICAL THERAPY THORACOLUMBAR PROGRESS NOTE   Patient Name: Jonathon [name] MRN: [id number] DOB:March 24, 1941, 82 y.o., male Today's Date: 06/23/2022  END OF SESSION:  PT End of Session - 06/23/22 0804     Visit Number 6    Date for PT Re-Evaluation 07/30/22    Authorization Type BCBS MEDICARE    Progress Note Due on Visit 10    PT Start Time 0803    PT Stop Time 0842    PT Time Calculation (min) 39 min    Activity Tolerance Patient tolerated treatment well    Behavior During Therapy [facility] for tasks assessed/performed                Past Medical History:  Diagnosis Date   Anxiety    never tried on SSRI. xanax 0.5mg  BID through Dr. Doristine Counter.    DDD (degenerative disc disease), lumbar    lumbar DDD- follows with orthopedist Dr. Leron Croak ortho. mobic 15mg  daily and hydrocodone 5/325 prn   Family history of breast cancer    HLD (hyperlipidemia)    Macular degeneration 07/21/2016   Follows with optho- areds2. He states he is unsure of diagnosis.    Neuropathy    unknown cause ? back related(sees ortho for back). burning sensation in feet. gabapentin 100mg  after dinner, 2 before bed   Smoker    8-10 cigarettes a day. plus nicorette. 60 years. over 50 pack years   Venous stasis     in the past- Wound center on legs. Elevate. Compression stockings.    Past Surgical History:  Procedure Laterality Date   ESOPHAGOGASTRODUODENOSCOPY Left 05/28/2016    Surgeon: , MD; food stuck in esophagus   LUMBAR LAMINECTOMY/DECOMPRESSION MICRODISCECTOMY Left 09/13/2020   Procedure: Left Lumbar four-five Laminectomy for facet/synovial cyst;  Surgeon: Charna Elizabeth, MD;  Location: [facility] OR;  Service: Neurosurgery;  Laterality: Left;   Patient Active Problem List   Diagnosis Date Noted   Emphysema lung (HCC) 12/03/2020   Lumbar radiculopathy, chronic 09/13/2020   Degenerative lumbar spinal stenosis 04/06/2019   OSA on CPAP 12/03/2017   Jock itch 07/31/2017    Hyperlipidemia 01/28/2017   Pulmonary nodules 12/26/2016   Aortic atherosclerosis (HCC) 10/28/2016   Genetic testing 10/14/2016   Family history of colon cancer 10/02/2016   Family history of breast cancer    Hilar lymphadenopathy 09/23/2016   Lymph nodes enlarged 09/23/2016   Family history of genetic disease 08/28/2016   Macular degeneration 07/21/2016   Neuropathy    DDD (degenerative disc disease), lumbar    GAD (generalized anxiety disorder)    Cigarette smoker     PCP: 08/30/2016, MD   REFERRING PROVIDER: 09/18/2016, MD  REFERRING DIAG: [id number] (ICD-10-CM) - Other spondylosis with radiculopathy, lumbar region  Rationale for Evaluation and Treatment: Rehabilitation  THERAPY DIAG:  Other low back pain  Abnormal posture  Muscle weakness (generalized)  Cramp and spasm  Difficulty in walking, not elsewhere classified  ONSET DATE: 06/02/2022  SUBJECTIVE:  SUBJECTIVE STATEMENT: I hurt my back bending over this weekend to work on a pipe. It is better now but my legs are still sore from last session (in a good way.)  PERTINENT HISTORY:  Previous cyst removal by Dr. Danielle Dess  PAIN:  Are you having pain? Yes: NPRS scale: 0/10 Pain location: midline low back Pain description: aching Aggravating factors: standing, bending, stooping and squatting  Relieving factors: medication, ice, heat  PRECAUTIONS: None  WEIGHT BEARING RESTRICTIONS: No  FALLS:  Has patient fallen in last 6 months? No  LIVING ENVIRONMENT: Lives with: lives with their spouse Lives in: House/apartment  OCCUPATION: retired  PLOF: Independent, Independent with basic ADLs, Independent with household mobility without device, Independent with community mobility without device, Independent with homemaking with  ambulation, Independent with gait, and Independent with transfers  PATIENT GOALS: to be able to bend, stoop and squat and do my usual outdoor work without pain so bad that I have to lay in bed the rest of the day  NEXT MD VISIT: prn  OBJECTIVE:   DIAGNOSTIC FINDINGS:  IMPRESSION: 1. Transitional anatomy with lumbarization of the S1 vertebral body. In keeping with prior reports, the lowest fully formed disc space is designated S1-S2. 2. Facet arthropathy most advanced at L4-L5 and L5-S1. 3. Mild-to-moderate right and mild left neural foraminal stenosis at L3-L4, not significantly changed. 4. At L4-L5, there is moderate spinal canal stenosis with right worse than left subarticular zone narrowing and mild-to-moderate left worse than right neural foraminal stenosis, not significantly changed since 2020. 5. At L5-S1, there is moderate spinal canal stenosis with bilateral subarticular zone narrowing and potential irritation of either traversing S1 nerve root, left worse than right, and mild-to-moderate left and mild right neural foraminal stenosis at L5-S1. The spinal canal and left subarticular zone narrowing at L5-S1 appear improved compared to the study from 2020. 6. Left worse than right subarticular zone narrowing at S1-S2 with abutment of the traversing S2 nerve roots, not significantly changed. 7. Levocurvature centered at L3 with associated right eccentric disc degeneration and degenerative endplate change at L3-L4, unchanged.      PATIENT SURVEYS:  FOTO 48 (goal is 56)  SCREENING FOR RED FLAGS: Bowel or bladder incontinence: No Spinal tumors: No Cauda equina syndrome: No Compression fracture: No Abdominal aneurysm: No  COGNITION: Overall cognitive status: Within functional limits for tasks assessed     SENSATION: WFL  MUSCLE LENGTH: Hamstrings: Right 45 deg; Left 50 deg Thomas test: Right pos ; Left slightly pos   POSTURE:  MRI notes mild scoliosis  acquired   LUMBAR ROM:   AROM eval  Flexion WNL  Extension 75%  Right lateral flexion To joint line  Left lateral flexion To just above joint line  Right rotation 50%  Left rotation 50%   (Blank rows = not tested)  LOWER EXTREMITY ROM:     WFL  LOWER EXTREMITY MMT:    Generally 4+ to 5/5 throughout  LUMBAR SPECIAL TESTS:  Straight leg raise test: Negative  FUNCTIONAL TESTS:  5 times sit to stand: 28.68 sec  Timed up and go (TUG): 13.80 sec  GAIT: Distance walked: 30 Assistive device utilized: None Level of assistance: Complete Independence Comments: shuffle, poor foot clearance, antalgic  TODAY'S TREATMENT:    06/23/21: Nustep new model L5 7 min with PTA present to discuss present status. Standing ankle DF/PF on rocker board 20x 2nd step calf stretch 10x right/left  Supine lower trunk rotation (post walk) 20x and 1x single knee  to chest 30 sec Bil Sit to stand from low mat table 10x2 no UE VC to extend thoracic once standing Modified dead lifts holding 8# wts: lift to height of trampoline 10x2 Supine bridge 3 sec hold 10x Supine blue loop clamshells 20x Seated red band horizontal abd with Vc to sit tall 10x2: then diagonals 10x Bil Tall walking /suitcase carry with 5# 3 laps:  06/20/21: Nustep new model L5 7 min with PTA present to discuss present status. Standing ankle DF/PF on rocker board 20x 2nd step calf stretch 10x right/left  Golfers reach holding barre to touch tall cone on upright 4 inch step 10x each side  Sit to stand from ow mat table 10x no UE Modified dead lifts holding 8# wts: lift to height of trampoline 10x Supine bridge 3 sec hold 10x Supine blue loop clamshells 15x Seated red band horizontal abd with Vc to sit tall 10x: then diagonals 10x Bil Tall walking /suitcase carry with 3# 3 laps: easy, pt can do more next session  06/18/22: Nustep L3 old model seat 11, arms 12 full lap 6:10 min Seated long sit on edge of table ankle DF for neural  mobility 10x right/left  Seated ball roll outs with exhale to encourage more stretch 3x forward and right/left 3x 2nd step calf stretch 5x right/left  2nd step UE reach up and over 5x right/left Sit to stand 5x standard chair No UE 8# dumbbell modified dead lift to cone 2x5 cues to bend knees Standing calf raise at [facility] 10x Hip abduction standing at [place] 10x Bil, VC to not bend the other way Golfers reach holding barre to touch tall cone on upright 4 inch step 10x each side  Therapeutic activities:  sit to stand, standing, walking,  lifting, pushing, pulling    PATIENT EDUCATION:  Education details: Initiated HEP Person educated: Patient Education method: Programmer, multimedia, Facilities manager, Verbal cues, and Handouts Education comprehension: verbalized understanding, returned demonstration, and verbal cues required  HOME EXERCISE PROGRAM: Access Code: [email or web address] URL: [email or web address] Date: 06/06/2022 Prepared by: Lavinia Sharps  Exercises - Supine Hamstring Stretch with Strap  - 1 x daily - 7 x weekly - 1 sets - 2 reps - 30 hold - Supine Figure 4 Piriformis Stretch  - 1 x daily - 7 x weekly - 1 sets - 2 reps - 30 hold - Supine Piriformis Stretch with Foot on Ground  - 1 x daily - 7 x weekly - 1 sets - 2 reps - 30 hold - Standing Quad Stretch with Table and Chair Support  - 1 x daily - 7 x weekly - 1 sets - 2 reps - 30 hold - Seated Cat Cow  - 1 x daily - 7 x weekly - 1 sets - 10 reps - Seated Table Hamstring Stretch  - 1 x daily - 7 x weekly - 1 sets - 10 reps - Supine Single Knee to Chest Stretch  - 1 x daily - 7 x weekly - 1 sets - 2 reps - 30 hold - Supine Double Knee to Chest  - 1 x daily - 7 x weekly - 1 sets - 1 reps - 30 hold   ASSESSMENT:  CLINICAL IMPRESSION: Pt arrives to PT with no back pain. He reports working on "a line" and working with heavy wrenches which flared his pain for 24 hours. He used ice which may have helped.He was not limited in any exercise  from pain but his quads were still sore so we kept  resistance the same as last but we increased reps where we could. PTA suggested to Pt to do his stretches around the noon time to see if that helps his afternoon pain.   OBJECTIVE IMPAIRMENTS: decreased activity tolerance, decreased mobility, difficulty walking, decreased ROM, decreased strength, hypomobility, increased fascial restrictions, increased muscle spasms, impaired flexibility, improper body mechanics, postural dysfunction, and pain.   ACTIVITY LIMITATIONS: carrying, lifting, bending, sitting, standing, squatting, sleeping, stairs, transfers, bed mobility, and dressing  PARTICIPATION LIMITATIONS: meal prep, cleaning, laundry, driving, shopping, community activity, and yard work  PERSONAL FACTORS: Age, Fitness, Past/current experiences, Profession, Time since onset of injury/illness/exacerbation, and 1-2 comorbidities: anxiety, macular degeneration  are also affecting patient's functional outcome.   REHAB POTENTIAL: Good  CLINICAL DECISION MAKING: Stable/uncomplicated  EVALUATION COMPLEXITY: Low   GOALS: Goals reviewed with patient? Yes  SHORT TERM GOALS: Target date: 07/02/2022    Pain report to be no greater than 4/10  Baseline: Goal status: INITIAL  2.  Patient will be independent with initial HEP  Baseline:  Goal status: Goal met 06/20/21   LONG TERM GOALS: Target date: 07/30/2022    Patient to report pain no greater than 2/10  Baseline:  Goal status: INITIAL  2.  Patient to be independent with advanced HEP  Baseline:  Goal status: INITIAL  3.  FOTO to improve to 58 Baseline: 48 Goal status: INITIAL  4.  5 times sit to stand to improve by 10 sec Baseline:  Goal status: INITIAL  5.  TUG score to improve by 3-5 seconds Baseline:  Goal status: INITIAL  6.  Patient to report 85% improvement in overall symptoms  Baseline:  Goal status: INITIAL  PLAN:  PT FREQUENCY: 1-2x/week  PT DURATION: 8  weeks  PLANNED INTERVENTIONS: Therapeutic exercises, Therapeutic activity, Neuromuscular re-education, Balance training, Gait training, Patient/Family education, Self Care, Joint mobilization, Stair training, Aquatic Therapy, Dry Needling, Electrical stimulation, Spinal mobilization, Cryotherapy, Moist heat, Taping, Traction, Ultrasound, Ionotophoresis 4mg /ml Dexamethasone, Manual therapy, and Re-evaluation.  PLAN FOR NEXT SESSION: Do FOTO next for goals and see if pt interested in progressing HEP  Myrene Galas, PTA 06/23/22 8:44 AM   Phone: [phone] Fax: [phone]  Phone: [phone] Fax: [place] [address], [address] [place], [place] [address] Phone # [phone] Fax [phone]

## 2022-06-25 ENCOUNTER — Ambulatory Visit: Payer: Medicare Other | Admitting: Physical Therapy

## 2022-06-25 DIAGNOSIS — R262 Difficulty in walking, not elsewhere classified: Secondary | ICD-10-CM | POA: Diagnosis not present

## 2022-06-25 DIAGNOSIS — M5459 Other low back pain: Secondary | ICD-10-CM

## 2022-06-25 DIAGNOSIS — R252 Cramp and spasm: Secondary | ICD-10-CM | POA: Diagnosis not present

## 2022-06-25 DIAGNOSIS — R293 Abnormal posture: Secondary | ICD-10-CM | POA: Diagnosis not present

## 2022-06-25 DIAGNOSIS — M6281 Muscle weakness (generalized): Secondary | ICD-10-CM

## 2022-06-25 NOTE — Therapy (Signed)
OUTPATIENT PHYSICAL THERAPY THORACOLUMBAR PROGRESS NOTE   Patient Name: Jonathon [name] MRN: [id number] DOB:December 20, 1940, 82 y.o., male Today's Date: 06/25/2022  END OF SESSION:  PT End of Session - 06/25/22 0759     Visit Number 7    Date for PT Re-Evaluation 07/30/22    Authorization Type BCBS MEDICARE    Progress Note Due on Visit 10    PT Start Time 0800    PT Stop Time 0840    PT Time Calculation (min) 40 min    Activity Tolerance Patient tolerated treatment well                Past Medical History:  Diagnosis Date   Anxiety    never tried on SSRI. xanax 0.5mg  BID through Dr. Tollie Pizza.    DDD (degenerative disc disease), lumbar    lumbar DDD- follows with orthopedist Dr. Linus Orn ortho. mobic 15mg  daily and hydrocodone 5/325 prn   Family history of breast cancer    HLD (hyperlipidemia)    Macular degeneration 07/21/2016   Follows with optho- areds2. He states he is unsure of diagnosis.    Neuropathy    unknown cause ? back related(sees ortho for back). burning sensation in feet. gabapentin 100mg  after dinner, 2 before bed   Smoker    8-10 cigarettes a day. plus nicorette. 60 years. over 50 pack years   Venous stasis     in the past- Wound center on legs. Elevate. Compression stockings.    Past Surgical History:  Procedure Laterality Date   ESOPHAGOGASTRODUODENOSCOPY Left 05/28/2016    Surgeon: Juanita Craver, MD; food stuck in esophagus   LUMBAR LAMINECTOMY/DECOMPRESSION MICRODISCECTOMY Left 09/13/2020   Procedure: Left Lumbar four-five Laminectomy for facet/synovial cyst;  Surgeon: Kristeen Miss, MD;  Location: [place];  Service: Neurosurgery;  Laterality: Left;   Patient Active Problem List   Diagnosis Date Noted   Emphysema lung ([employer]) 12/03/2020   Lumbar radiculopathy, chronic 09/13/2020   Degenerative lumbar spinal stenosis 04/06/2019   OSA on CPAP 12/03/2017   Jock itch 07/31/2017   Hyperlipidemia 01/28/2017   Pulmonary nodules 12/26/2016   Aortic  atherosclerosis ([place]) 10/28/2016   Genetic testing 10/14/2016   Family history of colon cancer 10/02/2016   Family history of breast cancer    Hilar lymphadenopathy 09/23/2016   Lymph nodes enlarged 09/23/2016   Family history of genetic disease 08/28/2016   Macular degeneration 07/21/2016   Neuropathy    DDD (degenerative disc disease), lumbar    GAD (generalized anxiety disorder)    Cigarette smoker     PCP: Marin Olp, MD   REFERRING PROVIDER: Kristeen Miss, MD  REFERRING DIAG: [phone] (ICD-10-CM) - Other spondylosis with radiculopathy, lumbar region  Rationale for Evaluation and Treatment: Rehabilitation  THERAPY DIAG:  Other low back pain  Abnormal posture  Muscle weakness (generalized)  ONSET DATE: 06/02/2022  SUBJECTIVE:  SUBJECTIVE STATEMENT: My back is in pretty good shape.  I didn't have any problems after last time.  Just a few sore muscles that I'm not used to.  8/10 pain at mid day.  When asked if he was doing his home ex's he states, "yes I walk to the refrigerator and back".  PERTINENT HISTORY:  Previous cyst removal by Dr. Danielle Dess  PAIN:  Are you having pain? Yes: NPRS scale: 0/10 Pain location: midline low back Pain description: aching Aggravating factors: standing, bending, stooping and squatting  Relieving factors: medication, ice, heat  PRECAUTIONS: None  WEIGHT BEARING RESTRICTIONS: No  FALLS:  Has patient fallen in last 6 months? No  LIVING ENVIRONMENT: Lives with: lives with their spouse Lives in: House/apartment  OCCUPATION: retired  PLOF: Independent, Independent with basic ADLs, Independent with household mobility without device, Independent with community mobility without device, Independent with homemaking with ambulation, Independent with  gait, and Independent with transfers  PATIENT GOALS: to be able to bend, stoop and squat and do my usual outdoor work without pain so bad that I have to lay in bed the rest of the day  NEXT MD VISIT: prn  OBJECTIVE:   DIAGNOSTIC FINDINGS:  IMPRESSION: 1. Transitional anatomy with lumbarization of the S1 vertebral body. In keeping with prior reports, the lowest fully formed disc space is designated S1-S2. 2. Facet arthropathy most advanced at L4-L5 and L5-S1. 3. Mild-to-moderate right and mild left neural foraminal stenosis at L3-L4, not significantly changed. 4. At L4-L5, there is moderate spinal canal stenosis with right worse than left subarticular zone narrowing and mild-to-moderate left worse than right neural foraminal stenosis, not significantly changed since 2020. 5. At L5-S1, there is moderate spinal canal stenosis with bilateral subarticular zone narrowing and potential irritation of either traversing S1 nerve root, left worse than right, and mild-to-moderate left and mild right neural foraminal stenosis at L5-S1. The spinal canal and left subarticular zone narrowing at L5-S1 appear improved compared to the study from 2020. 6. Left worse than right subarticular zone narrowing at S1-S2 with abutment of the traversing S2 nerve roots, not significantly changed. 7. Levocurvature centered at L3 with associated right eccentric disc degeneration and degenerative endplate change at L3-L4, unchanged.      PATIENT SURVEYS:  FOTO 48 (goal is 69)  SCREENING FOR RED FLAGS: Bowel or bladder incontinence: No Spinal tumors: No Cauda equina syndrome: No Compression fracture: No Abdominal aneurysm: No  COGNITION: Overall cognitive status: Within functional limits for tasks assessed     SENSATION: WFL  MUSCLE LENGTH: Hamstrings: Right 45 deg; Left 50 deg Thomas test: Right pos ; Left slightly pos   POSTURE:  MRI notes mild scoliosis acquired   LUMBAR ROM:   AROM eval   Flexion WNL  Extension 75%  Right lateral flexion To joint line  Left lateral flexion To just above joint line  Right rotation 50%  Left rotation 50%   (Blank rows = not tested)  LOWER EXTREMITY ROM:     WFL  LOWER EXTREMITY MMT:    Generally 4+ to 5/5 throughout  LUMBAR SPECIAL TESTS:  Straight leg raise test: Negative  FUNCTIONAL TESTS:  5 times sit to stand: 28.68 sec  Timed up and go (TUG): 13.80 sec  GAIT: Distance walked: 30 Assistive device utilized: None Level of assistance: Complete Independence Comments: shuffle, poor foot clearance, antalgic  TODAY'S TREATMENT:   06/25/22: Nustep old model L2 6 min while discussing status 2nd step hip flexor and  calf  stretch  with UE elevation 10x right/left  2nd step HS stretch 10x right/left (very tight both sides) Standing lat bar 30# 10x2 Seated: sit ups holding 8# weight 10x  Sit to stand holding 8# weight 10x Seated using hands on purple X-heavy loop pulling back/trunk extension 2x10 Modified dead lifts holding 2 8# wts to knee level  Tall walking /suitcase carry with 2 8# weights 2 laps Therapeutic activities:  sit to stand, standing, walking,  lifting, pushing, pulling        06/23/21: Nustep new model L5 7 min with PTA present to discuss present status. Standing ankle DF/PF on rocker board 20x 2nd step calf stretch 10x right/left  Supine lower trunk rotation (post walk) 20x and 1x single knee to chest 30 sec Bil Sit to stand from low mat table 10x2 no UE VC to extend thoracic once standing Modified dead lifts holding 8# wts: lift to height of trampoline 10x2 Supine bridge 3 sec hold 10x Supine blue loop clamshells 20x Seated red band horizontal abd with Vc to sit tall 10x2: then diagonals 10x Bil Tall walking /suitcase carry with 5# 3 laps:  06/20/21: Nustep new model L5 7 min with PTA present to discuss present status. Standing ankle DF/PF on rocker board 20x 2nd step calf stretch 10x right/left   Golfers reach holding barre to touch tall cone on upright 4 inch step 10x each side  Sit to stand from ow mat table 10x no UE Modified dead lifts holding 8# wts: lift to height of trampoline 10x Supine bridge 3 sec hold 10x Supine blue loop clamshells 15x Seated red band horizontal abd with Vc to sit tall 10x: then diagonals 10x Bil Tall walking /suitcase carry with 3# 3 laps: easy, pt can do more next session  PATIENT EDUCATION:  Education details: Initiated HEP Person educated: Patient Education method: Consulting civil engineer, Media planner, Verbal cues, and Handouts Education comprehension: verbalized understanding, returned demonstration, and verbal cues required  HOME EXERCISE PROGRAM: Access Code: [email or web address] URL: [email or web address] Date: 06/06/2022 Prepared by: Ruben Im  Exercises - Supine Hamstring Stretch with Strap  - 1 x daily - 7 x weekly - 1 sets - 2 reps - 30 hold - Supine Figure 4 Piriformis Stretch  - 1 x daily - 7 x weekly - 1 sets - 2 reps - 30 hold - Supine Piriformis Stretch with Foot on Ground  - 1 x daily - 7 x weekly - 1 sets - 2 reps - 30 hold - Standing Quad Stretch with Table and Chair Support  - 1 x daily - 7 x weekly - 1 sets - 2 reps - 30 hold - Seated Cat Cow  - 1 x daily - 7 x weekly - 1 sets - 10 reps - Seated Table Hamstring Stretch  - 1 x daily - 7 x weekly - 1 sets - 10 reps - Supine Single Knee to Chest Stretch  - 1 x daily - 7 x weekly - 1 sets - 2 reps - 30 hold - Supine Double Knee to Chest  - 1 x daily - 7 x weekly - 1 sets - 1 reps - 30 hold   ASSESSMENT:  CLINICAL IMPRESSION:  Patient able to progress weights/resistance with several exercises today without complaints of pain but reports he can feel his muscles working. Verbal cues for forward head position with dead lifting which improves hip hinge.  End of session he states his back feels "just fine".    OBJECTIVE IMPAIRMENTS: decreased activity tolerance,  decreased mobility,  difficulty walking, decreased ROM, decreased strength, hypomobility, increased fascial restrictions, increased muscle spasms, impaired flexibility, improper body mechanics, postural dysfunction, and pain.   ACTIVITY LIMITATIONS: carrying, lifting, bending, sitting, standing, squatting, sleeping, stairs, transfers, bed mobility, and dressing  PARTICIPATION LIMITATIONS: meal prep, cleaning, laundry, driving, shopping, community activity, and yard work  PERSONAL FACTORS: Age, Fitness, Past/current experiences, Profession, Time since onset of injury/illness/exacerbation, and 1-2 comorbidities: anxiety, macular degeneration  are also affecting patient's functional outcome.   REHAB POTENTIAL: Good  CLINICAL DECISION MAKING: Stable/uncomplicated  EVALUATION COMPLEXITY: Low   GOALS: Goals reviewed with patient? Yes  SHORT TERM GOALS: Target date: 07/02/2022    Pain report to be no greater than 4/10  Baseline:  at mid day 8/10 pain Goal status:  ongoing  2.  Patient will be independent with initial HEP  Baseline:  Goal status: Goal met 06/20/21   LONG TERM GOALS: Target date: 07/30/2022    Patient to report pain no greater than 2/10  Baseline:  Goal status: INITIAL  2.  Patient to be independent with advanced HEP  Baseline:  Goal status: INITIAL  3.  FOTO to improve to 58 Baseline: 48 Goal status: INITIAL  4.  5 times sit to stand to improve by 10 sec Baseline:  Goal status: INITIAL  5.  TUG score to improve by 3-5 seconds Baseline:  Goal status: INITIAL  6.  Patient to report 85% improvement in overall symptoms  Baseline:  Goal status: INITIAL  PLAN:  PT FREQUENCY: 1-2x/week  PT DURATION: 8 weeks  PLANNED INTERVENTIONS: Therapeutic exercises, Therapeutic activity, Neuromuscular re-education, Balance training, Gait training, Patient/Family education, Self Care, Joint mobilization, Stair training, Aquatic Therapy, Dry Needling, Electrical stimulation, Spinal  mobilization, Cryotherapy, Moist heat, Taping, Traction, Ultrasound, Ionotophoresis 4mg /ml Dexamethasone, Manual therapy, and Re-evaluation.  PLAN FOR NEXT SESSION: trunk and LE strengthening;  flexibility especially HS  , PT 06/25/22 8:51 AM Phone: [phone] Fax: [phone]

## 2022-06-26 ENCOUNTER — Other Ambulatory Visit: Payer: Self-pay | Admitting: Family Medicine

## 2022-06-30 ENCOUNTER — Encounter: Payer: Self-pay | Admitting: Physical Therapy

## 2022-06-30 ENCOUNTER — Ambulatory Visit: Payer: Medicare Other | Admitting: Physical Therapy

## 2022-06-30 DIAGNOSIS — M5459 Other low back pain: Secondary | ICD-10-CM

## 2022-06-30 DIAGNOSIS — R252 Cramp and spasm: Secondary | ICD-10-CM | POA: Diagnosis not present

## 2022-06-30 DIAGNOSIS — M6281 Muscle weakness (generalized): Secondary | ICD-10-CM | POA: Diagnosis not present

## 2022-06-30 DIAGNOSIS — R293 Abnormal posture: Secondary | ICD-10-CM

## 2022-06-30 DIAGNOSIS — R262 Difficulty in walking, not elsewhere classified: Secondary | ICD-10-CM | POA: Diagnosis not present

## 2022-06-30 NOTE — Therapy (Signed)
OUTPATIENT PHYSICAL THERAPY THORACOLUMBAR PROGRESS NOTE   Patient Name: Jonathon [name] MRN: [id number] DOB:01-17-1941, 82 y.o., male Today's Date: 06/30/2022  END OF SESSION:  PT End of Session - 06/30/22 0849     Visit Number 8    Date for PT Re-Evaluation 07/30/22    Authorization Type BCBS MEDICARE    Progress Note Due on Visit 10    PT Start Time [phone]    PT Stop Time 0935    PT Time Calculation (min) 49 min    Activity Tolerance Patient tolerated treatment well    Behavior During Therapy [facility] for tasks assessed/performed                 Past Medical History:  Diagnosis Date   Anxiety    never tried on SSRI. xanax 0.5mg  BID through Dr. Tollie Pizza.    DDD (degenerative disc disease), lumbar    lumbar DDD- follows with orthopedist Dr. Linus Orn ortho. mobic 15mg  daily and hydrocodone 5/325 prn   Family history of breast cancer    HLD (hyperlipidemia)    Macular degeneration 07/21/2016   Follows with optho- areds2. He states he is unsure of diagnosis.    Neuropathy    unknown cause ? back related(sees ortho for back). burning sensation in feet. gabapentin 100mg  after dinner, 2 before bed   Smoker    8-10 cigarettes a day. plus nicorette. 60 years. over 50 pack years   Venous stasis     in the past- Wound center on legs. Elevate. Compression stockings.    Past Surgical History:  Procedure Laterality Date   ESOPHAGOGASTRODUODENOSCOPY Left 05/28/2016    Surgeon: Juanita Craver, MD; food stuck in esophagus   LUMBAR LAMINECTOMY/DECOMPRESSION MICRODISCECTOMY Left 09/13/2020   Procedure: Left Lumbar four-five Laminectomy for facet/synovial cyst;  Surgeon: Kristeen Miss, MD;  Location: [place];  Service: Neurosurgery;  Laterality: Left;   Patient Active Problem List   Diagnosis Date Noted   Emphysema lung ([place]) 12/03/2020   Lumbar radiculopathy, chronic 09/13/2020   Degenerative lumbar spinal stenosis 04/06/2019   OSA on CPAP 12/03/2017   Jock itch 07/31/2017    Hyperlipidemia 01/28/2017   Pulmonary nodules 12/26/2016   Aortic atherosclerosis ([place]) 10/28/2016   Genetic testing 10/14/2016   Family history of colon cancer 10/02/2016   Family history of breast cancer    Hilar lymphadenopathy 09/23/2016   Lymph nodes enlarged 09/23/2016   Family history of genetic disease 08/28/2016   Macular degeneration 07/21/2016   Neuropathy    DDD (degenerative disc disease), lumbar    GAD (generalized anxiety disorder)    Cigarette smoker     PCP: Marin Olp, MD   REFERRING PROVIDER: Kristeen Miss, MD  REFERRING DIAG: [phone] (ICD-10-CM) - Other spondylosis with radiculopathy, lumbar region  Rationale for Evaluation and Treatment: Rehabilitation  THERAPY DIAG:  Other low back pain  Abnormal posture  Muscle weakness (generalized)  Cramp and spasm  Difficulty in walking, not elsewhere classified  ONSET DATE: 06/02/2022  SUBJECTIVE:  SUBJECTIVE STATEMENT: My back hurt after last visit. I think it was fro picking those dumb bells up last time. Lasted a few days.   PERTINENT HISTORY:  Previous cyst removal by Dr. Danielle Dess  PAIN:  Are you having pain? Yes: NPRS scale: 0/10 Pain location: midline low back Pain description: aching Aggravating factors: standing, bending, stooping and squatting  Relieving factors: medication, ice, heat  PRECAUTIONS: None  WEIGHT BEARING RESTRICTIONS: No  FALLS:  Has patient fallen in last 6 months? No  LIVING ENVIRONMENT: Lives with: lives with their spouse Lives in: House/apartment  OCCUPATION: retired  PLOF: Independent, Independent with basic ADLs, Independent with household mobility without device, Independent with community mobility without device, Independent with homemaking with ambulation, Independent with  gait, and Independent with transfers  PATIENT GOALS: to be able to bend, stoop and squat and do my usual outdoor work without pain so bad that I have to lay in bed the rest of the day  NEXT MD VISIT: prn  OBJECTIVE:   DIAGNOSTIC FINDINGS:  IMPRESSION: 1. Transitional anatomy with lumbarization of the S1 vertebral body. In keeping with prior reports, the lowest fully formed disc space is designated S1-S2. 2. Facet arthropathy most advanced at L4-L5 and L5-S1. 3. Mild-to-moderate right and mild left neural foraminal stenosis at L3-L4, not significantly changed. 4. At L4-L5, there is moderate spinal canal stenosis with right worse than left subarticular zone narrowing and mild-to-moderate left worse than right neural foraminal stenosis, not significantly changed since 2020. 5. At L5-S1, there is moderate spinal canal stenosis with bilateral subarticular zone narrowing and potential irritation of either traversing S1 nerve root, left worse than right, and mild-to-moderate left and mild right neural foraminal stenosis at L5-S1. The spinal canal and left subarticular zone narrowing at L5-S1 appear improved compared to the study from 2020. 6. Left worse than right subarticular zone narrowing at S1-S2 with abutment of the traversing S2 nerve roots, not significantly changed. 7. Levocurvature centered at L3 with associated right eccentric disc degeneration and degenerative endplate change at L3-L4, unchanged.      PATIENT SURVEYS:  FOTO 48 (goal is 46)  SCREENING FOR RED FLAGS: Bowel or bladder incontinence: No Spinal tumors: No Cauda equina syndrome: No Compression fracture: No Abdominal aneurysm: No  COGNITION: Overall cognitive status: Within functional limits for tasks assessed     SENSATION: WFL  MUSCLE LENGTH: Hamstrings: Right 45 deg; Left 50 deg Thomas test: Right pos ; Left slightly pos   POSTURE:  MRI notes mild scoliosis acquired   LUMBAR ROM:   AROM eval   Flexion WNL  Extension 75%  Right lateral flexion To joint line  Left lateral flexion To just above joint line  Right rotation 50%  Left rotation 50%   (Blank rows = not tested)  LOWER EXTREMITY ROM:     WFL  LOWER EXTREMITY MMT:    Generally 4+ to 5/5 throughout  LUMBAR SPECIAL TESTS:  Straight leg raise test: Negative  FUNCTIONAL TESTS:  5 times sit to stand: 28.68 sec  Timed up and go (TUG): 13.80 sec  GAIT: Distance walked: 30 Assistive device utilized: None Level of assistance: Complete Independence Comments: shuffle, poor foot clearance, antalgic  TODAY'S TREATMENT:    06/30/22: Nustep L4 4 New model 7 min with PTA present to discuss current progress Second step calf & hamstring stretching 10x Bil Standing lat bar pull: 30# 10x2 Standing hip up and over the low hurdle Bil 10x, requires holding balance bar. Sit to stand  holding 5# KB 10x Sit to stand with over head punch (cue core) 5x Bil Suitcase carry around gym 3 laps with 8# weights Modified dead lift, knees slightly bent, 8# height of mini tramp 2x5 Seated yellow band pulls to contract multifidus 10x Standing blue band lat pulls 2x10 Gave pt MHP 5 min to low back post TE  06/25/22: Nustep old model L2 6 min while discussing status 2nd step hip flexor and  calf stretch  with UE elevation 10x right/left  2nd step HS stretch 10x right/left (very tight both sides) Standing lat bar 30# 10x2 Seated: sit ups holding 8# weight 10x  Sit to stand holding 8# weight 10x Seated using hands on purple X-heavy loop pulling back/trunk extension 2x10 Modified dead lifts holding 2 8# wts to knee level  Tall walking /suitcase carry with 2 8# weights 2 laps Therapeutic activities:  sit to stand, standing, walking,  lifting, pushing, pulling        06/23/21: Nustep new model L5 7 min with PTA present to discuss present status. Standing ankle DF/PF on rocker board 20x 2nd step calf stretch 10x right/left  Supine  lower trunk rotation (post walk) 20x and 1x single knee to chest 30 sec Bil Sit to stand from low mat table 10x2 no UE VC to extend thoracic once standing Modified dead lifts holding 8# wts: lift to height of trampoline 10x2 Supine bridge 3 sec hold 10x Supine blue loop clamshells 20x Seated red band horizontal abd with Vc to sit tall 10x2: then diagonals 10x Bil Tall walking /suitcase carry with 5# 3 laps:   PATIENT EDUCATION:  Education details: Initiated HEP Person educated: Patient Education method: Consulting civil engineer, Media planner, Verbal cues, and Handouts Education comprehension: verbalized understanding, returned demonstration, and verbal cues required  HOME EXERCISE PROGRAM: Access Code: [email or web address] URL: [email or web address] Date: 06/06/2022 Prepared by: Ruben Im  Exercises - Supine Hamstring Stretch with Strap  - 1 x daily - 7 x weekly - 1 sets - 2 reps - 30 hold - Supine Figure 4 Piriformis Stretch  - 1 x daily - 7 x weekly - 1 sets - 2 reps - 30 hold - Supine Piriformis Stretch with Foot on Ground  - 1 x daily - 7 x weekly - 1 sets - 2 reps - 30 hold - Standing Quad Stretch with Table and Chair Support  - 1 x daily - 7 x weekly - 1 sets - 2 reps - 30 hold - Seated Cat Cow  - 1 x daily - 7 x weekly - 1 sets - 10 reps - Seated Table Hamstring Stretch  - 1 x daily - 7 x weekly - 1 sets - 10 reps - Supine Single Knee to Chest Stretch  - 1 x daily - 7 x weekly - 1 sets - 2 reps - 30 hold - Supine Double Knee to Chest  - 1 x daily - 7 x weekly - 1 sets - 1 reps - 30 hold   ASSESSMENT:  CLINICAL IMPRESSION: Pt arrives today pain free. He reports increased back pain after last session that lasted around 24 hours and needing to take a pain pill. After 24 hours pain was abolished. Treatment today continued to focus on stretching hamstrings and strengthening LE and trunk muscles. No issues with todays exercises.    OBJECTIVE IMPAIRMENTS: decreased activity tolerance,  decreased mobility, difficulty walking, decreased ROM, decreased strength, hypomobility, increased fascial restrictions, increased muscle spasms, impaired flexibility, improper body mechanics, postural dysfunction, and pain.  ACTIVITY LIMITATIONS: carrying, lifting, bending, sitting, standing, squatting, sleeping, stairs, transfers, bed mobility, and dressing  PARTICIPATION LIMITATIONS: meal prep, cleaning, laundry, driving, shopping, community activity, and yard work  PERSONAL FACTORS: Age, Fitness, Past/current experiences, Profession, Time since onset of injury/illness/exacerbation, and 1-2 comorbidities: anxiety, macular degeneration  are also affecting patient's functional outcome.   REHAB POTENTIAL: Good  CLINICAL DECISION MAKING: Stable/uncomplicated  EVALUATION COMPLEXITY: Low   GOALS: Goals reviewed with patient? Yes  SHORT TERM GOALS: Target date: 07/02/2022    Pain report to be no greater than 4/10  Baseline:  at mid day 8/10 pain Goal status:  ongoing  2.  Patient will be independent with initial HEP  Baseline:  Goal status: Goal met 06/20/21   LONG TERM GOALS: Target date: 07/30/2022    Patient to report pain no greater than 2/10  Baseline:  Goal status: INITIAL  2.  Patient to be independent with advanced HEP  Baseline:  Goal status: INITIAL  3.  FOTO to improve to 58 Baseline: 48 Goal status: INITIAL  4.  5 times sit to stand to improve by 10 sec Baseline:  Goal status: INITIAL  5.  TUG score to improve by 3-5 seconds Baseline:  Goal status: INITIAL  6.  Patient to report 85% improvement in overall symptoms  Baseline:  Goal status: INITIAL  PLAN:  PT FREQUENCY: 1-2x/week  PT DURATION: 8 weeks  PLANNED INTERVENTIONS: Therapeutic exercises, Therapeutic activity, Neuromuscular re-education, Balance training, Gait training, Patient/Family education, Self Care, Joint mobilization, Stair training, Aquatic Therapy, Dry Needling, Electrical  stimulation, Spinal mobilization, Cryotherapy, Moist heat, Taping, Traction, Ultrasound, Ionotophoresis 4mg /ml Dexamethasone, Manual therapy, and Re-evaluation.  PLAN FOR NEXT SESSION: trunk and LE strengthening;  flexibility especially HS  , PTA 06/30/22 9:27 AM Phone: [phone] Fax: [phone]

## 2022-07-02 ENCOUNTER — Ambulatory Visit: Payer: Medicare Other | Admitting: Physical Therapy

## 2022-07-02 DIAGNOSIS — R293 Abnormal posture: Secondary | ICD-10-CM

## 2022-07-02 DIAGNOSIS — M5459 Other low back pain: Secondary | ICD-10-CM | POA: Diagnosis not present

## 2022-07-02 DIAGNOSIS — R262 Difficulty in walking, not elsewhere classified: Secondary | ICD-10-CM | POA: Diagnosis not present

## 2022-07-02 DIAGNOSIS — R252 Cramp and spasm: Secondary | ICD-10-CM | POA: Diagnosis not present

## 2022-07-02 DIAGNOSIS — M6281 Muscle weakness (generalized): Secondary | ICD-10-CM

## 2022-07-02 NOTE — Therapy (Signed)
OUTPATIENT PHYSICAL THERAPY THORACOLUMBAR PROGRESS NOTE   Patient Name: Jonathon [name] MRN: [id number] DOB:28-Jul-1940, 82 y.o., male Today's Date: 07/02/2022    Progress Note Reporting Period 12/20 to 07/02/22  See note below for Objective Data and Assessment of Progress/Goals.     END OF SESSION:  PT End of Session - 07/02/22 0800     Visit Number 9    Date for PT Re-Evaluation 07/30/22    Authorization Type BCBS MEDICARE    Progress Note Due on Visit 19    PT Start Time 0802    PT Stop Time 0843    PT Time Calculation (min) 41 min    Activity Tolerance Patient tolerated treatment well                 Past Medical History:  Diagnosis Date   Anxiety    never tried on SSRI. xanax 0.5mg  BID through Dr. Doristine Counter.    DDD (degenerative disc disease), lumbar    lumbar DDD- follows with orthopedist Dr. Leron Croak ortho. mobic 15mg  daily and hydrocodone 5/325 prn   Family history of breast cancer    HLD (hyperlipidemia)    Macular degeneration 07/21/2016   Follows with optho- areds2. He states he is unsure of diagnosis.    Neuropathy    unknown cause ? back related(sees ortho for back). burning sensation in feet. gabapentin 100mg  after dinner, 2 before bed   Smoker    8-10 cigarettes a day. plus nicorette. 60 years. over 50 pack years   Venous stasis     in the past- Wound center on legs. [facility]. Compression stockings.    Past Surgical History:  Procedure Laterality Date   ESOPHAGOGASTRODUODENOSCOPY Left 05/28/2016    Surgeon: , MD; food stuck in esophagus   LUMBAR LAMINECTOMY/DECOMPRESSION MICRODISCECTOMY Left 09/13/2020   Procedure: Left Lumbar four-five Laminectomy for facet/synovial cyst;  Surgeon: Charna Elizabeth, MD;  Location: [facility] OR;  Service: Neurosurgery;  Laterality: Left;   Patient Active Problem List   Diagnosis Date Noted   Emphysema lung ([facility]) 12/03/2020   Lumbar radiculopathy, chronic 09/13/2020   Degenerative lumbar spinal stenosis  04/06/2019   OSA on CPAP 12/03/2017   Jock itch 07/31/2017   Hyperlipidemia 01/28/2017   Pulmonary nodules 12/26/2016   Aortic atherosclerosis ([facility]) 10/28/2016   Genetic testing 10/14/2016   Family history of colon cancer 10/02/2016   Family history of breast cancer    Hilar lymphadenopathy 09/23/2016   Lymph nodes enlarged 09/23/2016   Family history of genetic disease 08/28/2016   Macular degeneration 07/21/2016   Neuropathy    DDD (degenerative disc disease), lumbar    GAD (generalized anxiety disorder)    Cigarette smoker     PCP: 08/30/2016, MD   REFERRING PROVIDER: 09/18/2016, MD  REFERRING DIAG: [phone] (ICD-10-CM) - Other spondylosis with radiculopathy, lumbar region  Rationale for Evaluation and Treatment: Rehabilitation  THERAPY DIAG:  Other low back pain  Abnormal posture  Muscle weakness (generalized)  ONSET DATE: 06/02/2022  SUBJECTIVE:  SUBJECTIVE STATEMENT: I'm feeling lousy because I had to come here in the cold weather.  The back feels alright right now.  I don't know what it will feel like when I leave here.    PERTINENT HISTORY:  Previous cyst removal by Dr. Ellene Route  PAIN:  Are you having pain? Yes: NPRS scale: 0/10 Pain location: midline low back Pain description: aching Aggravating factors: standing, bending, stooping and squatting  Relieving factors: medication, ice, heat  PRECAUTIONS: None  WEIGHT BEARING RESTRICTIONS: No  FALLS:  Has patient fallen in last 6 months? No  LIVING ENVIRONMENT: Lives with: lives with their spouse Lives in: House/apartment  OCCUPATION: retired  PLOF: Independent, Independent with basic ADLs, Independent with household mobility without device, Independent with community mobility without device, Independent with  homemaking with ambulation, Independent with gait, and Independent with transfers  PATIENT GOALS: to be able to bend, stoop and squat and do my usual outdoor work without pain so bad that I have to lay in bed the rest of the day  NEXT MD VISIT: prn  OBJECTIVE:   DIAGNOSTIC FINDINGS:  IMPRESSION: 1. Transitional anatomy with lumbarization of the S1 vertebral body. In keeping with prior reports, the lowest fully formed disc space is designated S1-S2. 2. Facet arthropathy most advanced at L4-L5 and L5-S1. 3. Mild-to-moderate right and mild left neural foraminal stenosis at L3-L4, not significantly changed. 4. At L4-L5, there is moderate spinal canal stenosis with right worse than left subarticular zone narrowing and mild-to-moderate left worse than right neural foraminal stenosis, not significantly changed since 2020. 5. At L5-S1, there is moderate spinal canal stenosis with bilateral subarticular zone narrowing and potential irritation of either traversing S1 nerve root, left worse than right, and mild-to-moderate left and mild right neural foraminal stenosis at L5-S1. The spinal canal and left subarticular zone narrowing at L5-S1 appear improved compared to the study from 2020. 6. Left worse than right subarticular zone narrowing at S1-S2 with abutment of the traversing S2 nerve roots, not significantly changed. 7. Levocurvature centered at L3 with associated right eccentric disc degeneration and degenerative endplate change at I2-L7, unchanged.      PATIENT SURVEYS:  FOTO 48 (goal is 22) 1/17:  56%  SCREENING FOR RED FLAGS: Bowel or bladder incontinence: No Spinal tumors: No Cauda equina syndrome: No Compression fracture: No Abdominal aneurysm: No  COGNITION: Overall cognitive status: Within functional limits for tasks assessed     SENSATION: WFL  MUSCLE LENGTH: Hamstrings: Right 45 deg; Left 50 deg Thomas test: Right pos ; Left slightly pos   POSTURE:  MRI  notes mild scoliosis acquired   LUMBAR ROM:   AROM eval 1/17  Flexion WNL Fingers 8 inch from floor  Extension 75% 50 limited  Right lateral flexion To joint line 25% limited  Left lateral flexion To just above joint line 25% limited  Right rotation 50% 25% limited  Left rotation 50% 25% limited   (Blank rows = not tested)  LOWER EXTREMITY ROM:     WFL  LOWER EXTREMITY MMT:    Generally 4+ to 5/5 throughout  LUMBAR SPECIAL TESTS:  Straight leg raise test: Negative  FUNCTIONAL TESTS:  5 times sit to stand: 28.68 sec  Timed up and go (TUG): 13.80 sec     1/17:   5x sit to stand 20.89 sec (hands lightly on thighs)                 TUG: 11.65 (uses hands on chair)  GAIT:  Distance walked: 30 Assistive device utilized: None Level of assistance: Complete Independence Comments: shuffle, poor foot clearance, antalgic  TODAY'S TREATMENT:   07/02/22: Nu-step L2 old model 5 min to discuss status TUG 5x sit to stand FOTO  56% Lumbar ROM Seated blue ball roll outs to stretch lumbar fascia 3 min Hip machine 40#: abduction, extension, flexion right/left 10x each Standing lat bar 30# 20x Therapeutic activities:  sit to stand, standing, walking,  lifting, pushing, pulling       06/30/22: Nustep L4 4 New model 7 min with PTA present to discuss current progress Second step calf & hamstring stretching 10x Bil Standing lat bar pull: 30# 10x2 Standing hip up and over the low hurdle Bil 10x, requires holding balance bar. Sit to stand holding 5# KB 10x Sit to stand with over head punch (cue core) 5x Bil Suitcase carry around gym 3 laps with 8# weights Modified dead lift, knees slightly bent, 8# height of mini tramp 2x5 Seated yellow band pulls to contract multifidus 10x Standing blue band lat pulls 2x10 Gave pt MHP 5 min to low back post TE  06/25/22: Nustep old model L2 6 min while discussing status 2nd step hip flexor and  calf stretch  with UE elevation 10x right/left  2nd  step HS stretch 10x right/left (very tight both sides) Standing lat bar 30# 10x2 Seated: sit ups holding 8# weight 10x  Sit to stand holding 8# weight 10x Seated using hands on purple X-heavy loop pulling back/trunk extension 2x10 Modified dead lifts holding 2 8# wts to knee level  Tall walking /suitcase carry with 2 8# weights 2 laps Therapeutic activities:  sit to stand, standing, walking,  lifting, pushing, pulling       PATIENT EDUCATION:  Education details: Initiated HEP Person educated: Patient Education method: Programmer, multimedia, Facilities manager, Verbal cues, and Handouts Education comprehension: verbalized understanding, returned demonstration, and verbal cues required  HOME EXERCISE PROGRAM: Access Code: [email or web address] URL: [email or web address] Date: 06/06/2022 Prepared by: Lavinia Sharps  Exercises - Supine Hamstring Stretch with Strap  - 1 x daily - 7 x weekly - 1 sets - 2 reps - 30 hold - Supine Figure 4 Piriformis Stretch  - 1 x daily - 7 x weekly - 1 sets - 2 reps - 30 hold - Supine Piriformis Stretch with Foot on Ground  - 1 x daily - 7 x weekly - 1 sets - 2 reps - 30 hold - Standing Quad Stretch with Table and Chair Support  - 1 x daily - 7 x weekly - 1 sets - 2 reps - 30 hold - Seated Cat Cow  - 1 x daily - 7 x weekly - 1 sets - 10 reps - Seated Table Hamstring Stretch  - 1 x daily - 7 x weekly - 1 sets - 10 reps - Supine Single Knee to Chest Stretch  - 1 x daily - 7 x weekly - 1 sets - 2 reps - 30 hold - Supine Double Knee to Chest  - 1 x daily - 7 x weekly - 1 sets - 1 reps - 30 hold   ASSESSMENT:  CLINICAL IMPRESSION:  Good improvements in objective re-tests since start of care including FOTO outcome measurement, spinal ROM, and physical performance tests including the 5x sit to stand test and TUG.  Discussed results with patient.  He generally has no back pain during session but reports consistent pain starting at lunch time.  He states the pain comes on at  the  time despite whether he has been sedentary or active.  Noted more difficulty with left LE motor control compared to right which pt reports is the result of neuropathy on the left side from a cyst in his spine.  Therapist monitoring response to all interventions and modifying treatment accordingly.      OBJECTIVE IMPAIRMENTS: decreased activity tolerance, decreased mobility, difficulty walking, decreased ROM, decreased strength, hypomobility, increased fascial restrictions, increased muscle spasms, impaired flexibility, improper body mechanics, postural dysfunction, and pain.   ACTIVITY LIMITATIONS: carrying, lifting, bending, sitting, standing, squatting, sleeping, stairs, transfers, bed mobility, and dressing  PARTICIPATION LIMITATIONS: meal prep, cleaning, laundry, driving, shopping, community activity, and yard work  PERSONAL FACTORS: Age, Fitness, Past/current experiences, Profession, Time since onset of injury/illness/exacerbation, and 1-2 comorbidities: anxiety, macular degeneration  are also affecting patient's functional outcome.   REHAB POTENTIAL: Good  CLINICAL DECISION MAKING: Stable/uncomplicated  EVALUATION COMPLEXITY: Low   GOALS: Goals reviewed with patient? Yes  SHORT TERM GOALS: Target date: 07/02/2022    Pain report to be no greater than 4/10  Baseline:  at mid day 8/10 pain Goal status:  ongoing  2.  Patient will be independent with initial HEP  Baseline:  Goal status: Goal met 06/20/21   LONG TERM GOALS: Target date: 07/30/2022    Patient to report pain no greater than 2/10  Baseline:  Goal status: INITIAL  2.  Patient to be independent with advanced HEP  Baseline:  Goal status: INITIAL  3.  FOTO to improve to 58 Baseline: 48 Goal status: INITIAL  4.  5 times sit to stand to improve by 10 sec Baseline:  Goal status: INITIAL  5.  TUG score to improve by 3-5 seconds Baseline:  Goal status: INITIAL  6.  Patient to report 85% improvement in  overall symptoms  Baseline:  Goal status: INITIAL  PLAN:  PT FREQUENCY: 1-2x/week  PT DURATION: 8 weeks  PLANNED INTERVENTIONS: Therapeutic exercises, Therapeutic activity, Neuromuscular re-education, Balance training, Gait training, Patient/Family education, Self Care, Joint mobilization, Stair training, Aquatic Therapy, Dry Needling, Electrical stimulation, Spinal mobilization, Cryotherapy, Moist heat, Taping, Traction, Ultrasound, Ionotophoresis 4mg /ml Dexamethasone, Manual therapy, and Re-evaluation.  PLAN FOR NEXT SESSION: trunk and LE strengthening;  flexibility especially HS; 10th visit progress note completed today on 9th visit therefore not needed again to visit #19  Ruben Im, PT 07/02/22 11:33 AM Phone: [phone] Fax: [phone]

## 2022-07-03 DIAGNOSIS — H40033 Anatomical narrow angle, bilateral: Secondary | ICD-10-CM | POA: Diagnosis not present

## 2022-07-03 DIAGNOSIS — H2513 Age-related nuclear cataract, bilateral: Secondary | ICD-10-CM | POA: Diagnosis not present

## 2022-07-03 DIAGNOSIS — H35373 Puckering of macula, bilateral: Secondary | ICD-10-CM | POA: Diagnosis not present

## 2022-07-03 DIAGNOSIS — H524 Presbyopia: Secondary | ICD-10-CM | POA: Diagnosis not present

## 2022-07-03 DIAGNOSIS — H353132 Nonexudative age-related macular degeneration, bilateral, intermediate dry stage: Secondary | ICD-10-CM | POA: Diagnosis not present

## 2022-07-07 ENCOUNTER — Ambulatory Visit: Payer: Medicare Other | Admitting: Physical Therapy

## 2022-07-07 ENCOUNTER — Encounter: Payer: Self-pay | Admitting: Physical Therapy

## 2022-07-07 DIAGNOSIS — R252 Cramp and spasm: Secondary | ICD-10-CM | POA: Diagnosis not present

## 2022-07-07 DIAGNOSIS — R293 Abnormal posture: Secondary | ICD-10-CM

## 2022-07-07 DIAGNOSIS — M5459 Other low back pain: Secondary | ICD-10-CM

## 2022-07-07 DIAGNOSIS — M6281 Muscle weakness (generalized): Secondary | ICD-10-CM | POA: Diagnosis not present

## 2022-07-07 DIAGNOSIS — R262 Difficulty in walking, not elsewhere classified: Secondary | ICD-10-CM | POA: Diagnosis not present

## 2022-07-07 NOTE — Therapy (Signed)
OUTPATIENT PHYSICAL THERAPY THORACOLUMBAR PROGRESS NOTE   Patient Name: Jonathon [name] MRN: [id number] DOB:1941/02/25, 82 y.o., male Today's Date: 07/07/2022     END OF SESSION:  PT End of Session - 07/07/22 0805     Visit Number 10    Date for PT Re-Evaluation 07/30/22    Authorization Type BCBS MEDICARE    Progress Note Due on Visit 19    PT Start Time 0803    PT Stop Time 0841    PT Time Calculation (min) 38 min    Activity Tolerance Patient tolerated treatment well    Behavior During Therapy WFL for tasks assessed/performed                 Past Medical History:  Diagnosis Date   Anxiety    never tried on SSRI. xanax 0.5mg  BID through Dr. Tollie Pizza.    DDD (degenerative disc disease), lumbar    lumbar DDD- follows with orthopedist Dr. Linus Orn ortho. mobic 15mg  daily and hydrocodone 5/325 prn   Family history of breast cancer    HLD (hyperlipidemia)    Macular degeneration 07/21/2016   Follows with optho- areds2. He states he is unsure of diagnosis.    Neuropathy    unknown cause ? back related(sees ortho for back). burning sensation in feet. gabapentin 100mg  after dinner, 2 before bed   Smoker    8-10 cigarettes a day. plus nicorette. 60 years. over 50 pack years   Venous stasis     in the past- Wound center on legs. Elevate. Compression stockings.    Past Surgical History:  Procedure Laterality Date   ESOPHAGOGASTRODUODENOSCOPY Left 05/28/2016    Surgeon: Juanita Craver, MD; food stuck in esophagus   LUMBAR LAMINECTOMY/DECOMPRESSION MICRODISCECTOMY Left 09/13/2020   Procedure: Left Lumbar four-five Laminectomy for facet/synovial cyst;  Surgeon: Kristeen Miss, MD;  Location: [facility];  Service: Neurosurgery;  Laterality: Left;   Patient Active Problem List   Diagnosis Date Noted   Emphysema lung ([place]) 12/03/2020   Lumbar radiculopathy, chronic 09/13/2020   Degenerative lumbar spinal stenosis 04/06/2019   OSA on CPAP 12/03/2017   Jock itch 07/31/2017    Hyperlipidemia 01/28/2017   Pulmonary nodules 12/26/2016   Aortic atherosclerosis ([place]) 10/28/2016   Genetic testing 10/14/2016   Family history of colon cancer 10/02/2016   Family history of breast cancer    Hilar lymphadenopathy 09/23/2016   Lymph nodes enlarged 09/23/2016   Family history of genetic disease 08/28/2016   Macular degeneration 07/21/2016   Neuropathy    DDD (degenerative disc disease), lumbar    GAD (generalized anxiety disorder)    Cigarette smoker     PCP: Marin Olp, MD   REFERRING PROVIDER: Kristeen Miss, MD  REFERRING DIAG: [phone] (ICD-10-CM) - Other spondylosis with radiculopathy, lumbar region  Rationale for Evaluation and Treatment: Rehabilitation  THERAPY DIAG:  Other low back pain  Abnormal posture  Muscle weakness (generalized)  Cramp and spasm  Difficulty in walking, not elsewhere classified  ONSET DATE: 06/02/2022  SUBJECTIVE:  SUBJECTIVE STATEMENT: I was ok after last time, no increased pain. I did nothing over the weekend.   PERTINENT HISTORY:  Previous cyst removal by Dr. Danielle Dess  PAIN:  Are you having pain? No  PRECAUTIONS: None  WEIGHT BEARING RESTRICTIONS: No  FALLS:  Has patient fallen in last 6 months? No  LIVING ENVIRONMENT: Lives with: lives with their spouse Lives in: House/apartment  OCCUPATION: retired  PLOF: Independent, Independent with basic ADLs, Independent with household mobility without device, Independent with community mobility without device, Independent with homemaking with ambulation, Independent with gait, and Independent with transfers  PATIENT GOALS: to be able to bend, stoop and squat and do my usual outdoor work without pain so bad that I have to lay in bed the rest of the day  NEXT MD VISIT:  prn  OBJECTIVE:   DIAGNOSTIC FINDINGS:  IMPRESSION: 1. Transitional anatomy with lumbarization of the S1 vertebral body. In keeping with prior reports, the lowest fully formed disc space is designated S1-S2. 2. Facet arthropathy most advanced at L4-L5 and L5-S1. 3. Mild-to-moderate right and mild left neural foraminal stenosis at L3-L4, not significantly changed. 4. At L4-L5, there is moderate spinal canal stenosis with right worse than left subarticular zone narrowing and mild-to-moderate left worse than right neural foraminal stenosis, not significantly changed since 2020. 5. At L5-S1, there is moderate spinal canal stenosis with bilateral subarticular zone narrowing and potential irritation of either traversing S1 nerve root, left worse than right, and mild-to-moderate left and mild right neural foraminal stenosis at L5-S1. The spinal canal and left subarticular zone narrowing at L5-S1 appear improved compared to the study from 2020. 6. Left worse than right subarticular zone narrowing at S1-S2 with abutment of the traversing S2 nerve roots, not significantly changed. 7. Levocurvature centered at L3 with associated right eccentric disc degeneration and degenerative endplate change at L3-L4, unchanged.      PATIENT SURVEYS:  FOTO 48 (goal is 27) 1/17:  56%  SCREENING FOR RED FLAGS: Bowel or bladder incontinence: No Spinal tumors: No Cauda equina syndrome: No Compression fracture: No Abdominal aneurysm: No  COGNITION: Overall cognitive status: Within functional limits for tasks assessed     SENSATION: WFL  MUSCLE LENGTH: Hamstrings: Right 45 deg; Left 50 deg Thomas test: Right pos ; Left slightly pos   POSTURE:  MRI notes mild scoliosis acquired   LUMBAR ROM:   AROM eval 1/17  Flexion WNL Fingers 8 inch from floor  Extension 75% 50 limited  Right lateral flexion To joint line 25% limited  Left lateral flexion To just above joint line 25% limited  Right  rotation 50% 25% limited  Left rotation 50% 25% limited   (Blank rows = not tested)  LOWER EXTREMITY ROM:     WFL  LOWER EXTREMITY MMT:    Generally 4+ to 5/5 throughout  LUMBAR SPECIAL TESTS:  Straight leg raise test: Negative  FUNCTIONAL TESTS:  5 times sit to stand: 28.68 sec  Timed up and go (TUG): 13.80 sec     1/17:   5x sit to stand 20.89 sec (hands lightly on thighs)                 TUG: 11.65 (uses hands on chair)  GAIT: Distance walked: 30 Assistive device utilized: None Level of assistance: Complete Independence Comments: shuffle, poor foot clearance, antalgic  TODAY'S TREATMENT:    07/07/22: Nustep Old model L3 8 min with Pta present to discuss current status and pt's activity level at  home.  Bil gastroc and hamstring AROM on step 10x each Sit to stand with 5x pressing 5# wts out in front of him, VC to hold core tight Sit to stand holding 10# KB 5x  Suitcase carry 9# around ortho gym 3x, VC to stand tall Standing lat pull 30# 10x 35# 6x Leg press (7) Bil 50# 10x Seated red band pull 3 sec hold 10x for multifidus Modified dead lift, knees slightly bent, 8# height of mini tramp x8, VC to keep load close to his body  07/02/22: Nu-step L2 old model 5 min to discuss status TUG 5x sit to stand FOTO  56% Lumbar ROM Seated blue ball roll outs to stretch lumbar fascia 3 min Hip machine 40#: abduction, extension, flexion right/left 10x each Standing lat bar 30# 20x Therapeutic activities:  sit to stand, standing, walking,  lifting, pushing, pulling       06/30/22: Nustep L4 4 New model 7 min with PTA present to discuss current progress Second step calf & hamstring stretching 10x Bil Standing lat bar pull: 30# 10x2 Standing hip up and over the low hurdle Bil 10x, requires holding balance bar. Sit to stand holding 5# KB 10x Sit to stand with over head punch (cue core) 5x Bil Suitcase carry around gym 3 laps with 8# weights Modified dead lift, knees slightly  bent, 8# height of mini tramp 2x5 Seated yellow band pulls to contract multifidus 10x Standing blue band lat pulls 2x10 Gave pt MHP 5 min to low back post TE   PATIENT EDUCATION:  Education details: Initiated HEP Person educated: Patient Education method: Consulting civil engineer, Media planner, Verbal cues, and Handouts Education comprehension: verbalized understanding, returned demonstration, and verbal cues required  HOME EXERCISE PROGRAM: Access Code: [email or web address] URL: [email or web address] Date: 06/06/2022 Prepared by: Ruben Im  Exercises - Supine Hamstring Stretch with Strap  - 1 x daily - 7 x weekly - 1 sets - 2 reps - 30 hold - Supine Figure 4 Piriformis Stretch  - 1 x daily - 7 x weekly - 1 sets - 2 reps - 30 hold - Supine Piriformis Stretch with Foot on Ground  - 1 x daily - 7 x weekly - 1 sets - 2 reps - 30 hold - Standing Quad Stretch with Table and Chair Support  - 1 x daily - 7 x weekly - 1 sets - 2 reps - 30 hold - Seated Cat Cow  - 1 x daily - 7 x weekly - 1 sets - 10 reps - Seated Table Hamstring Stretch  - 1 x daily - 7 x weekly - 1 sets - 10 reps - Supine Single Knee to Chest Stretch  - 1 x daily - 7 x weekly - 1 sets - 2 reps - 30 hold - Supine Double Knee to Chest  - 1 x daily - 7 x weekly - 1 sets - 1 reps - 30 hold   ASSESSMENT:  CLINICAL IMPRESSION: Pt has difficulty with HEP compliance per his report. Postural cues throughout session but no pain while exercising, just muscular fatigue. Small increases in certain loads made today.   OBJECTIVE IMPAIRMENTS: decreased activity tolerance, decreased mobility, difficulty walking, decreased ROM, decreased strength, hypomobility, increased fascial restrictions, increased muscle spasms, impaired flexibility, improper body mechanics, postural dysfunction, and pain.   ACTIVITY LIMITATIONS: carrying, lifting, bending, sitting, standing, squatting, sleeping, stairs, transfers, bed mobility, and  dressing  PARTICIPATION LIMITATIONS: meal prep, cleaning, laundry, driving, shopping, community activity, and yard work  PERSONAL FACTORS: Age, Fitness,  Past/current experiences, Profession, Time since onset of injury/illness/exacerbation, and 1-2 comorbidities: anxiety, macular degeneration  are also affecting patient's functional outcome.   REHAB POTENTIAL: Good  CLINICAL DECISION MAKING: Stable/uncomplicated  EVALUATION COMPLEXITY: Low   GOALS: Goals reviewed with patient? Yes  SHORT TERM GOALS: Target date: 07/02/2022    Pain report to be no greater than 4/10  Baseline:  at mid day 8/10 pain Goal status:  ongoing  2.  Patient will be independent with initial HEP  Baseline:  Goal status: Goal met 06/20/21   LONG TERM GOALS: Target date: 07/30/2022    Patient to report pain no greater than 2/10  Baseline:  Goal status: INITIAL  2.  Patient to be independent with advanced HEP  Baseline:  Goal status: INITIAL  3.  FOTO to improve to 58 Baseline: 48 Goal status: INITIAL  4.  5 times sit to stand to improve by 10 sec Baseline:  Goal status: INITIAL  5.  TUG score to improve by 3-5 seconds Baseline:  Goal status: INITIAL  6.  Patient to report 85% improvement in overall symptoms  Baseline:  Goal status: INITIAL  PLAN:  PT FREQUENCY: 1-2x/week  PT DURATION: 8 weeks  PLANNED INTERVENTIONS: Therapeutic exercises, Therapeutic activity, Neuromuscular re-education, Balance training, Gait training, Patient/Family education, Self Care, Joint mobilization, Stair training, Aquatic Therapy, Dry Needling, Electrical stimulation, Spinal mobilization, Cryotherapy, Moist heat, Taping, Traction, Ultrasound, Ionotophoresis 4mg /ml Dexamethasone, Manual therapy, and Re-evaluation.  PLAN FOR NEXT SESSION: trunk and LE strengthening;  flexibility especially HS;   Myrene Galas, PTA 07/07/22 8:41 AM   Phone: [phone] Fax: [phone]

## 2022-07-09 ENCOUNTER — Ambulatory Visit: Payer: Medicare Other | Admitting: Physical Therapy

## 2022-07-09 DIAGNOSIS — R252 Cramp and spasm: Secondary | ICD-10-CM | POA: Diagnosis not present

## 2022-07-09 DIAGNOSIS — M5459 Other low back pain: Secondary | ICD-10-CM | POA: Diagnosis not present

## 2022-07-09 DIAGNOSIS — R262 Difficulty in walking, not elsewhere classified: Secondary | ICD-10-CM | POA: Diagnosis not present

## 2022-07-09 DIAGNOSIS — M6281 Muscle weakness (generalized): Secondary | ICD-10-CM

## 2022-07-09 DIAGNOSIS — R293 Abnormal posture: Secondary | ICD-10-CM

## 2022-07-09 NOTE — Therapy (Signed)
OUTPATIENT PHYSICAL THERAPY THORACOLUMBAR PROGRESS NOTE   Patient Name: Jonathon [name] MRN: [id number] DOB:1940/11/02, 82 y.o., male Today's Date: 07/09/2022     END OF SESSION:  PT End of Session - 07/09/22 0801     Visit Number 11    Date for PT Re-Evaluation 07/30/22    Authorization Type BCBS MEDICARE    Progress Note Due on Visit 19    PT Start Time 0801    PT Stop Time 0842    PT Time Calculation (min) 41 min    Activity Tolerance Patient tolerated treatment well                 Past Medical History:  Diagnosis Date   Anxiety    never tried on SSRI. xanax 0.5mg  BID through Dr. Doristine Counter.    DDD (degenerative disc disease), lumbar    lumbar DDD- follows with orthopedist Dr. Leron Croak ortho. mobic 15mg  daily and hydrocodone 5/325 prn   Family history of breast cancer    HLD (hyperlipidemia)    Macular degeneration 07/21/2016   Follows with optho- areds2. He states he is unsure of diagnosis.    Neuropathy    unknown cause ? back related(sees ortho for back). burning sensation in feet. gabapentin 100mg  after dinner, 2 before bed   Smoker    8-10 cigarettes a day. plus nicorette. 60 years. over 50 pack years   Venous stasis     in the past- Wound center on legs. [facility]. Compression stockings.    Past Surgical History:  Procedure Laterality Date   ESOPHAGOGASTRODUODENOSCOPY Left 05/28/2016    Surgeon: , MD; food stuck in esophagus   LUMBAR LAMINECTOMY/DECOMPRESSION MICRODISCECTOMY Left 09/13/2020   Procedure: Left Lumbar four-five Laminectomy for facet/synovial cyst;  Surgeon: Charna Elizabeth, MD;  Location: [facility] OR;  Service: Neurosurgery;  Laterality: Left;   Patient Active Problem List   Diagnosis Date Noted   Emphysema lung ([facility]) 12/03/2020   Lumbar radiculopathy, chronic 09/13/2020   Degenerative lumbar spinal stenosis 04/06/2019   OSA on CPAP 12/03/2017   Jock itch 07/31/2017   Hyperlipidemia 01/28/2017   Pulmonary nodules 12/26/2016    Aortic atherosclerosis ([facility]) 10/28/2016   Genetic testing 10/14/2016   Family history of colon cancer 10/02/2016   Family history of breast cancer    Hilar lymphadenopathy 09/23/2016   Lymph nodes enlarged 09/23/2016   Family history of genetic disease 08/28/2016   Macular degeneration 07/21/2016   Neuropathy    DDD (degenerative disc disease), lumbar    GAD (generalized anxiety disorder)    Cigarette smoker     PCP: 08/30/2016, MD   REFERRING PROVIDER: 09/18/2016, MD  REFERRING DIAG: [phone] (ICD-10-CM) - Other spondylosis with radiculopathy, lumbar region  Rationale for Evaluation and Treatment: Rehabilitation  THERAPY DIAG:  Other low back pain  Abnormal posture  Muscle weakness (generalized)  ONSET DATE: 06/02/2022  SUBJECTIVE:  SUBJECTIVE STATEMENT: No pain right now.  I was sore in my back from lifting the weights, it went away over night.  I want to do things that don't hurt my back, like those weights.    PERTINENT HISTORY:  Previous cyst removal by Dr. Ellene Route  PAIN:  Are you having pain? No  PRECAUTIONS: None  WEIGHT BEARING RESTRICTIONS: No  FALLS:  Has patient fallen in last 6 months? No  LIVING ENVIRONMENT: Lives with: lives with their spouse Lives in: House/apartment  OCCUPATION: retired  PLOF: Independent, Independent with basic ADLs, Independent with household mobility without device, Independent with community mobility without device, Independent with homemaking with ambulation, Independent with gait, and Independent with transfers  PATIENT GOALS: to be able to bend, stoop and squat and do my usual outdoor work without pain so bad that I have to lay in bed the rest of the day  NEXT MD VISIT: prn  OBJECTIVE:   DIAGNOSTIC FINDINGS:  IMPRESSION: 1.  Transitional anatomy with lumbarization of the S1 vertebral body. In keeping with prior reports, the lowest fully formed disc space is designated S1-S2. 2. Facet arthropathy most advanced at L4-L5 and L5-S1. 3. Mild-to-moderate right and mild left neural foraminal stenosis at L3-L4, not significantly changed. 4. At L4-L5, there is moderate spinal canal stenosis with right worse than left subarticular zone narrowing and mild-to-moderate left worse than right neural foraminal stenosis, not significantly changed since 2020. 5. At L5-S1, there is moderate spinal canal stenosis with bilateral subarticular zone narrowing and potential irritation of either traversing S1 nerve root, left worse than right, and mild-to-moderate left and mild right neural foraminal stenosis at L5-S1. The spinal canal and left subarticular zone narrowing at L5-S1 appear improved compared to the study from 2020. 6. Left worse than right subarticular zone narrowing at S1-S2 with abutment of the traversing S2 nerve roots, not significantly changed. 7. Levocurvature centered at L3 with associated right eccentric disc degeneration and degenerative endplate change at F8-B0, unchanged.      PATIENT SURVEYS:  FOTO 48 (goal is 60) 1/17:  56%  SCREENING FOR RED FLAGS: Bowel or bladder incontinence: No Spinal tumors: No Cauda equina syndrome: No Compression fracture: No Abdominal aneurysm: No  COGNITION: Overall cognitive status: Within functional limits for tasks assessed     SENSATION: WFL  MUSCLE LENGTH: Hamstrings: Right 45 deg; Left 50 deg Thomas test: Right pos ; Left slightly pos   POSTURE:  MRI notes mild scoliosis acquired   LUMBAR ROM:   AROM eval 1/17  Flexion WNL Fingers 8 inch from floor  Extension 75% 50 limited  Right lateral flexion To joint line 25% limited  Left lateral flexion To just above joint line 25% limited  Right rotation 50% 25% limited  Left rotation 50% 25% limited    (Blank rows = not tested)  LOWER EXTREMITY ROM:     WFL  LOWER EXTREMITY MMT:    Generally 4+ to 5/5 throughout  LUMBAR SPECIAL TESTS:  Straight leg raise test: Negative  FUNCTIONAL TESTS:  5 times sit to stand: 28.68 sec  Timed up and go (TUG): 13.80 sec     1/17:   5x sit to stand 20.89 sec (hands lightly on thighs)                 TUG: 11.65 (uses hands on chair)  GAIT: Distance walked: 30 Assistive device utilized: None Level of assistance: Complete Independence Comments: shuffle, poor foot clearance, antalgic  TODAY'S TREATMENT:  1/24: Nustep Old model L3 8 min with therapist present to discuss current status  Seated blue ball roll outs to stretch lumbar fascia 3 min Seated HS stretch 3x 30 sec holds right/left Doorway psoas stretch with UE elevation 2x 5 right/left Hip machine 40#: abduction, extension, flexion right/left 10x each Standing lat pull 30# 10x; 35# 10x Leg press (seat 7 reclined back a bit more) Bil 60# 10x2   07/07/22: Nustep Old model L3 8 min with Pta present to discuss current status and pt's activity level at home.  Bil gastroc and hamstring AROM on step 10x each Sit to stand with 5x pressing 5# wts out in front of him, VC to hold core tight Sit to stand holding 10# KB 5x  Suitcase carry 9# around ortho gym 3x, VC to stand tall Standing lat pull 30# 10x 35# 6x Leg press (7) Bil 50# 10x Seated red band pull 3 sec hold 10x for multifidus Modified dead lift, knees slightly bent, 8# height of mini tramp x8, VC to keep load close to his body  07/02/22: Nu-step L2 old model 5 min to discuss status TUG 5x sit to stand FOTO  56% Lumbar ROM Seated blue ball roll outs to stretch lumbar fascia 3 min Hip machine 40#: abduction, extension, flexion right/left 10x each Standing lat bar 30# 20x Therapeutic activities:  sit to stand, standing, walking,  lifting, pushing, pulling     PATIENT EDUCATION:  Education details: Initiated HEP Person  educated: Patient Education method: Consulting civil engineer, Media planner, Verbal cues, and Handouts Education comprehension: verbalized understanding, returned demonstration, and verbal cues required  HOME EXERCISE PROGRAM: Access Code: [email or web address] URL: [email or web address] Date: 06/06/2022 Prepared by: Ruben Im  Exercises - Supine Hamstring Stretch with Strap  - 1 x daily - 7 x weekly - 1 sets - 2 reps - 30 hold - Supine Figure 4 Piriformis Stretch  - 1 x daily - 7 x weekly - 1 sets - 2 reps - 30 hold - Supine Piriformis Stretch with Foot on Ground  - 1 x daily - 7 x weekly - 1 sets - 2 reps - 30 hold - Standing Quad Stretch with Table and Chair Support  - 1 x daily - 7 x weekly - 1 sets - 2 reps - 30 hold - Seated Cat Cow  - 1 x daily - 7 x weekly - 1 sets - 10 reps - Seated Table Hamstring Stretch  - 1 x daily - 7 x weekly - 1 sets - 10 reps - Supine Single Knee to Chest Stretch  - 1 x daily - 7 x weekly - 1 sets - 2 reps - 30 hold - Supine Double Knee to Chest  - 1 x daily - 7 x weekly - 1 sets - 1 reps - 30 hold   ASSESSMENT:  CLINICAL IMPRESSION:  Therapist modified treatment secondary to his concerns of post ex soreness in his back which he attributes to resistance training with the dumbbells.  He was agreeable to continue strength training with the machines.  Therapist monitoring response and providing cues to optimize effectiveness of the exercise.     OBJECTIVE IMPAIRMENTS: decreased activity tolerance, decreased mobility, difficulty walking, decreased ROM, decreased strength, hypomobility, increased fascial restrictions, increased muscle spasms, impaired flexibility, improper body mechanics, postural dysfunction, and pain.   ACTIVITY LIMITATIONS: carrying, lifting, bending, sitting, standing, squatting, sleeping, stairs, transfers, bed mobility, and dressing  PARTICIPATION LIMITATIONS: meal prep, cleaning, laundry, driving, shopping, community activity, and yard  work  PERSONAL FACTORS: Age, Fitness, Past/current experiences, Profession, Time since onset of injury/illness/exacerbation, and 1-2 comorbidities: anxiety, macular degeneration  are also affecting patient's functional outcome.   REHAB POTENTIAL: Good  CLINICAL DECISION MAKING: Stable/uncomplicated  EVALUATION COMPLEXITY: Low   GOALS: Goals reviewed with patient? Yes  SHORT TERM GOALS: Target date: 07/02/2022    Pain report to be no greater than 4/10  Baseline:  at mid day 8/10 pain Goal status:  ongoing  2.  Patient will be independent with initial HEP  Baseline:  Goal status: Goal met 06/20/21   LONG TERM GOALS: Target date: 07/30/2022    Patient to report pain no greater than 2/10  Baseline:  Goal status: INITIAL  2.  Patient to be independent with advanced HEP  Baseline:  Goal status: INITIAL  3.  FOTO to improve to 58 Baseline: 48 Goal status: INITIAL  4.  5 times sit to stand to improve by 10 sec Baseline:  Goal status: INITIAL  5.  TUG score to improve by 3-5 seconds Baseline:  Goal status: INITIAL  6.  Patient to report 85% improvement in overall symptoms  Baseline:  Goal status: INITIAL  PLAN:  PT FREQUENCY: 1-2x/week  PT DURATION: 8 weeks  PLANNED INTERVENTIONS: Therapeutic exercises, Therapeutic activity, Neuromuscular re-education, Balance training, Gait training, Patient/Family education, Self Care, Joint mobilization, Stair training, Aquatic Therapy, Dry Needling, Electrical stimulation, Spinal mobilization, Cryotherapy, Moist heat, Taping, Traction, Ultrasound, Ionotophoresis 4mg /ml Dexamethasone, Manual therapy, and Re-evaluation.  PLAN FOR NEXT SESSION: see how he felt with scaling back on style of resistance training today (no dumbbells);  trunk and LE strengthening;  flexibility especially HS, gastroc and hip flexors

## 2022-07-13 NOTE — Therapy (Unsigned)
OUTPATIENT PHYSICAL THERAPY THORACOLUMBAR PROGRESS NOTE   Patient Name: Jonathon [name] MRN: [id number] DOB:03-21-1941, 82 y.o., male Today's Date: 07/14/2022     END OF SESSION:  PT End of Session - 07/14/22 0804     Visit Number 12    Date for PT Re-Evaluation 07/30/22    Authorization Type BCBS MEDICARE    Progress Note Due on Visit 19    PT Start Time 0800    PT Stop Time 0838    PT Time Calculation (min) 38 min    Activity Tolerance Patient tolerated treatment well    Behavior During Therapy WFL for tasks assessed/performed                  Past Medical History:  Diagnosis Date   Anxiety    never tried on SSRI. xanax 0.5mg  BID through Dr. Doristine Counter.    DDD (degenerative disc disease), lumbar    lumbar DDD- follows with orthopedist Dr. Leron Croak ortho. mobic 15mg  daily and hydrocodone 5/325 prn   Family history of breast cancer    HLD (hyperlipidemia)    Macular degeneration 07/21/2016   Follows with optho- areds2. He states he is unsure of diagnosis.    Neuropathy    unknown cause ? back related(sees ortho for back). burning sensation in feet. gabapentin 100mg  after dinner, 2 before bed   Smoker    8-10 cigarettes a day. plus nicorette. 60 years. over 50 pack years   Venous stasis     in the past- Wound center on legs. [facility]. Compression stockings.    Past Surgical History:  Procedure Laterality Date   ESOPHAGOGASTRODUODENOSCOPY Left 05/28/2016    Surgeon: , MD; food stuck in esophagus   LUMBAR LAMINECTOMY/DECOMPRESSION MICRODISCECTOMY Left 09/13/2020   Procedure: Left Lumbar four-five Laminectomy for facet/synovial cyst;  Surgeon: Charna Elizabeth, MD;  Location: [facility] OR;  Service: Neurosurgery;  Laterality: Left;   Patient Active Problem List   Diagnosis Date Noted   Emphysema lung (HCC) 12/03/2020   Lumbar radiculopathy, chronic 09/13/2020   Degenerative lumbar spinal stenosis 04/06/2019   OSA on CPAP 12/03/2017   Jock itch 07/31/2017    Hyperlipidemia 01/28/2017   Pulmonary nodules 12/26/2016   Aortic atherosclerosis (HCC) 10/28/2016   Genetic testing 10/14/2016   Family history of colon cancer 10/02/2016   Family history of breast cancer    Hilar lymphadenopathy 09/23/2016   Lymph nodes enlarged 09/23/2016   Family history of genetic disease 08/28/2016   Macular degeneration 07/21/2016   Neuropathy    DDD (degenerative disc disease), lumbar    GAD (generalized anxiety disorder)    Cigarette smoker     PCP: 08/30/2016, MD   REFERRING PROVIDER: 09/18/2016, MD  REFERRING DIAG: [phone] (ICD-10-CM) - Other spondylosis with radiculopathy, lumbar region  Rationale for Evaluation and Treatment: Rehabilitation  THERAPY DIAG:  Other low back pain  Abnormal posture  Cramp and spasm  Muscle weakness (generalized)  Difficulty in walking, not elsewhere classified  ONSET DATE: 06/02/2022  SUBJECTIVE:  SUBJECTIVE STATEMENT: I didn't sleep well bc my back was really hurting last night, really no reason why I think it was hurting it just did. I blew leaves Saturday. Not lifting the weights was better. Pt has very low enery arriving to PT this AM from lack of sleep.  PERTINENT HISTORY:  Previous cyst removal by Dr. Ellene Route  PAIN:  Are you having pain? No  PRECAUTIONS: None  WEIGHT BEARING RESTRICTIONS: No  FALLS:  Has patient fallen in last 6 months? No  LIVING ENVIRONMENT: Lives with: lives with their spouse Lives in: House/apartment  OCCUPATION: retired  PLOF: Independent, Independent with basic ADLs, Independent with household mobility without device, Independent with community mobility without device, Independent with homemaking with ambulation, Independent with gait, and Independent with transfers  PATIENT  GOALS: to be able to bend, stoop and squat and do my usual outdoor work without pain so bad that I have to lay in bed the rest of the day  NEXT MD VISIT: prn  OBJECTIVE:   DIAGNOSTIC FINDINGS:  IMPRESSION: 1. Transitional anatomy with lumbarization of the S1 vertebral body. In keeping with prior reports, the lowest fully formed disc space is designated S1-S2. 2. Facet arthropathy most advanced at L4-L5 and L5-S1. 3. Mild-to-moderate right and mild left neural foraminal stenosis at L3-L4, not significantly changed. 4. At L4-L5, there is moderate spinal canal stenosis with right worse than left subarticular zone narrowing and mild-to-moderate left worse than right neural foraminal stenosis, not significantly changed since 2020. 5. At L5-S1, there is moderate spinal canal stenosis with bilateral subarticular zone narrowing and potential irritation of either traversing S1 nerve root, left worse than right, and mild-to-moderate left and mild right neural foraminal stenosis at L5-S1. The spinal canal and left subarticular zone narrowing at L5-S1 appear improved compared to the study from 2020. 6. Left worse than right subarticular zone narrowing at S1-S2 with abutment of the traversing S2 nerve roots, not significantly changed. 7. Levocurvature centered at L3 with associated right eccentric disc degeneration and degenerative endplate change at Q6-V7, unchanged.      PATIENT SURVEYS:  FOTO 48 (goal is 72) 1/17:  56%  SCREENING FOR RED FLAGS: Bowel or bladder incontinence: No Spinal tumors: No Cauda equina syndrome: No Compression fracture: No Abdominal aneurysm: No  COGNITION: Overall cognitive status: Within functional limits for tasks assessed     SENSATION: WFL  MUSCLE LENGTH: Hamstrings: Right 45 deg; Left 50 deg Thomas test: Right pos ; Left slightly pos   POSTURE:  MRI notes mild scoliosis acquired   LUMBAR ROM:   AROM eval 1/17  Flexion WNL Fingers 8 inch  from floor  Extension 75% 50 limited  Right lateral flexion To joint line 25% limited  Left lateral flexion To just above joint line 25% limited  Right rotation 50% 25% limited  Left rotation 50% 25% limited   (Blank rows = not tested)  LOWER EXTREMITY ROM:     WFL  LOWER EXTREMITY MMT:    Generally 4+ to 5/5 throughout  LUMBAR SPECIAL TESTS:  Straight leg raise test: Negative  FUNCTIONAL TESTS:  5 times sit to stand: 28.68 sec  Timed up and go (TUG): 13.80 sec     1/17:   5x sit to stand 20.89 sec (hands lightly on thighs)                 TUG: 11.65 (uses hands on chair)  GAIT: Distance walked: 30 Assistive device utilized: None Level of assistance: Complete  Independence Comments: shuffle, poor foot clearance, antalgic  TODAY'S TREATMENT:    07/14/22:  Nutsep L1 5 min warm up and discussion of status.  Seated blue ball forward flexion stretch 10xeach, the RT & LT 10x Supine Knee to chest 1x Bil 30 sec Lower trunk rotation 10x each side. Supine hamstring stretch with strap 2x 20 sec  Supine yellow loop clamshells with PPT 10x Red band supine horizontal abd with core contraction 10x Standing gastroc and hip flexor stretch at stairs   1/24: Nustep Old model L3 8 min with therapist present to discuss current status  Seated blue ball roll outs to stretch lumbar fascia 3 min Seated HS stretch 3x 30 sec holds right/left Doorway psoas stretch with UE elevation 2x 5 right/left Hip machine 40#: abduction, extension, flexion right/left 10x each Standing lat pull 30# 10x; 35# 10x Leg press (seat 7 reclined back a bit more) Bil 60# 10x2   07/07/22: Nustep Old model L3 8 min with Pta present to discuss current status and pt's activity level at home.  Bil gastroc and hamstring AROM on step 10x each Sit to stand with 5x pressing 5# wts out in front of him, VC to hold core tight Sit to stand holding 10# KB 5x  Suitcase carry 9# around ortho gym 3x, VC to stand tall Standing  lat pull 30# 10x 35# 6x Leg press (7) Bil 50# 10x Seated red band pull 3 sec hold 10x for multifidus Modified dead lift, knees slightly bent, 8# height of mini tramp x8, VC to keep load close to his body   PATIENT EDUCATION:  Education details: Initiated HEP Person educated: Patient Education method: Programmer, multimedia, Facilities manager, Verbal cues, and Handouts Education comprehension: verbalized understanding, returned demonstration, and verbal cues required  HOME EXERCISE PROGRAM: Access Code: [email or web address] URL: [email or web address] Date: 06/06/2022 Prepared by: Lavinia Sharps  Exercises - Supine Hamstring Stretch with Strap  - 1 x daily - 7 x weekly - 1 sets - 2 reps - 30 hold - Supine Figure 4 Piriformis Stretch  - 1 x daily - 7 x weekly - 1 sets - 2 reps - 30 hold - Supine Piriformis Stretch with Foot on Ground  - 1 x daily - 7 x weekly - 1 sets - 2 reps - 30 hold - Standing Quad Stretch with Table and Chair Support  - 1 x daily - 7 x weekly - 1 sets - 2 reps - 30 hold - Seated Cat Cow  - 1 x daily - 7 x weekly - 1 sets - 10 reps - Seated Table Hamstring Stretch  - 1 x daily - 7 x weekly - 1 sets - 10 reps - Supine Single Knee to Chest Stretch  - 1 x daily - 7 x weekly - 1 sets - 2 reps - 30 hold - Supine Double Knee to Chest  - 1 x daily - 7 x weekly - 1 sets - 1 reps - 30 hold   ASSESSMENT:  CLINICAL IMPRESSION: Pt arrives with no back pain but reports having a bad day yesterday where his pain "wrapped" around my torso. This also interrupted his sleep arriving to PT very fatigued. About 50% supine mat exercises to give back more support. Pt tolerates all exercises well without pain.    OBJECTIVE IMPAIRMENTS: decreased activity tolerance, decreased mobility, difficulty walking, decreased ROM, decreased strength, hypomobility, increased fascial restrictions, increased muscle spasms, impaired flexibility, improper body mechanics, postural dysfunction, and pain.   ACTIVITY  LIMITATIONS: carrying, lifting, bending,  sitting, standing, squatting, sleeping, stairs, transfers, bed mobility, and dressing  PARTICIPATION LIMITATIONS: meal prep, cleaning, laundry, driving, shopping, community activity, and yard work  PERSONAL FACTORS: Age, Fitness, Past/current experiences, Profession, Time since onset of injury/illness/exacerbation, and 1-2 comorbidities: anxiety, macular degeneration  are also affecting patient's functional outcome.   REHAB POTENTIAL: Good  CLINICAL DECISION MAKING: Stable/uncomplicated  EVALUATION COMPLEXITY: Low   GOALS: Goals reviewed with patient? Yes  SHORT TERM GOALS: Target date: 07/02/2022    Pain report to be no greater than 4/10  Baseline:  at mid day 8/10 pain Goal status:  ongoing  2.  Patient will be independent with initial HEP  Baseline:  Goal status: Goal met 06/20/21   LONG TERM GOALS: Target date: 07/30/2022    Patient to report pain no greater than 2/10  Baseline:  Goal status: INITIAL  2.  Patient to be independent with advanced HEP  Baseline:  Goal status: INITIAL  3.  FOTO to improve to 58 Baseline: 48 Goal status: INITIAL  4.  5 times sit to stand to improve by 10 sec Baseline:  Goal status: INITIAL  5.  TUG score to improve by 3-5 seconds Baseline:  Goal status: INITIAL  6.  Patient to report 85% improvement in overall symptoms  Baseline:  Goal status: INITIAL  PLAN:  PT FREQUENCY: 1-2x/week  PT DURATION: 8 weeks  PLANNED INTERVENTIONS: Therapeutic exercises, Therapeutic activity, Neuromuscular re-education, Balance training, Gait training, Patient/Family education, Self Care, Joint mobilization, Stair training, Aquatic Therapy, Dry Needling, Electrical stimulation, Spinal mobilization, Cryotherapy, Moist heat, Taping, Traction, Ultrasound, Ionotophoresis 4mg /ml Dexamethasone, Manual therapy, and Re-evaluation.  PLAN FOR NEXT SESSION: Continue with trunk strength and flexibility

## 2022-07-14 ENCOUNTER — Ambulatory Visit: Payer: Medicare Other | Admitting: Physical Therapy

## 2022-07-14 ENCOUNTER — Encounter: Payer: Self-pay | Admitting: Physical Therapy

## 2022-07-14 DIAGNOSIS — R252 Cramp and spasm: Secondary | ICD-10-CM

## 2022-07-14 DIAGNOSIS — R262 Difficulty in walking, not elsewhere classified: Secondary | ICD-10-CM | POA: Diagnosis not present

## 2022-07-14 DIAGNOSIS — R293 Abnormal posture: Secondary | ICD-10-CM | POA: Diagnosis not present

## 2022-07-14 DIAGNOSIS — M5459 Other low back pain: Secondary | ICD-10-CM

## 2022-07-14 DIAGNOSIS — M6281 Muscle weakness (generalized): Secondary | ICD-10-CM

## 2022-07-16 ENCOUNTER — Ambulatory Visit: Payer: Medicare Other | Admitting: Physical Therapy

## 2022-07-16 DIAGNOSIS — M5459 Other low back pain: Secondary | ICD-10-CM | POA: Diagnosis not present

## 2022-07-16 DIAGNOSIS — R293 Abnormal posture: Secondary | ICD-10-CM

## 2022-07-16 DIAGNOSIS — R262 Difficulty in walking, not elsewhere classified: Secondary | ICD-10-CM | POA: Diagnosis not present

## 2022-07-16 DIAGNOSIS — R252 Cramp and spasm: Secondary | ICD-10-CM | POA: Diagnosis not present

## 2022-07-16 DIAGNOSIS — M6281 Muscle weakness (generalized): Secondary | ICD-10-CM | POA: Diagnosis not present

## 2022-07-16 NOTE — Therapy (Signed)
OUTPATIENT PHYSICAL THERAPY THORACOLUMBAR PROGRESS NOTE   Patient Name: Jonathon [name] MRN: [id number] DOB:27-Jun-1940, 82 y.o., male Today's Date: 07/16/2022     END OF SESSION:  PT End of Session - 07/16/22 0758     Visit Number 13    Date for PT Re-Evaluation 07/30/22    Authorization Type BCBS MEDICARE    PT Start Time 0759    PT Stop Time 0839    PT Time Calculation (min) 40 min    Activity Tolerance Patient tolerated treatment well;Patient limited by fatigue                  Past Medical History:  Diagnosis Date   Anxiety    never tried on SSRI. xanax 0.5mg  BID through Dr. Doristine Counter.    DDD (degenerative disc disease), lumbar    lumbar DDD- follows with orthopedist Dr. Leron Croak ortho. mobic 15mg  daily and hydrocodone 5/325 prn   Family history of breast cancer    HLD (hyperlipidemia)    Macular degeneration 07/21/2016   Follows with optho- areds2. He states he is unsure of diagnosis.    Neuropathy    unknown cause ? back related(sees ortho for back). burning sensation in feet. gabapentin 100mg  after dinner, 2 before bed   Smoker    8-10 cigarettes a day. plus nicorette. 60 years. over 50 pack years   Venous stasis     in the past- Wound center on legs. [facility]. Compression stockings.    Past Surgical History:  Procedure Laterality Date   ESOPHAGOGASTRODUODENOSCOPY Left 05/28/2016    Surgeon: , MD; food stuck in esophagus   LUMBAR LAMINECTOMY/DECOMPRESSION MICRODISCECTOMY Left 09/13/2020   Procedure: Left Lumbar four-five Laminectomy for facet/synovial cyst;  Surgeon: Charna Elizabeth, MD;  Location: [facility] OR;  Service: Neurosurgery;  Laterality: Left;   Patient Active Problem List   Diagnosis Date Noted   Emphysema lung (HCC) 12/03/2020   Lumbar radiculopathy, chronic 09/13/2020   Degenerative lumbar spinal stenosis 04/06/2019   OSA on CPAP 12/03/2017   Jock itch 07/31/2017   Hyperlipidemia 01/28/2017   Pulmonary nodules 12/26/2016   Aortic  atherosclerosis (HCC) 10/28/2016   Genetic testing 10/14/2016   Family history of colon cancer 10/02/2016   Family history of breast cancer    Hilar lymphadenopathy 09/23/2016   Lymph nodes enlarged 09/23/2016   Family history of genetic disease 08/28/2016   Macular degeneration 07/21/2016   Neuropathy    DDD (degenerative disc disease), lumbar    GAD (generalized anxiety disorder)    Cigarette smoker     PCP: 08/30/2016, MD   REFERRING PROVIDER: 09/18/2016, MD  REFERRING DIAG: [phone] (ICD-10-CM) - Other spondylosis with radiculopathy, lumbar region  Rationale for Evaluation and Treatment: Rehabilitation  THERAPY DIAG:  Other low back pain  Abnormal posture  ONSET DATE: 06/02/2022  SUBJECTIVE:  SUBJECTIVE STATEMENT:  The pain has switched now and hurts some at night.  I went to bed at 3:30 b/c I was hurting.  Starts in the back and then wraps around to the front.  Last night was more extreme than it has been.  Reports it's random and not related to activity.  "I don't think (what we do here) has anything to do with it."     PERTINENT HISTORY:  Previous cyst removal by Dr. Ellene Route  PAIN:  Are you having pain? Yes "some" but unable to give a number rating  PRECAUTIONS: None  WEIGHT BEARING RESTRICTIONS: No  FALLS:  Has patient fallen in last 6 months? No  LIVING ENVIRONMENT: Lives with: lives with their spouse Lives in: House/apartment  OCCUPATION: retired  PLOF: Independent, Independent with basic ADLs, Independent with household mobility without device, Independent with community mobility without device, Independent with homemaking with ambulation, Independent with gait, and Independent with transfers  PATIENT GOALS: to be able to bend, stoop and squat and do my usual  outdoor work without pain so bad that I have to lay in bed the rest of the day  NEXT MD VISIT: prn  OBJECTIVE:   DIAGNOSTIC FINDINGS:  IMPRESSION: 1. Transitional anatomy with lumbarization of the S1 vertebral body. In keeping with prior reports, the lowest fully formed disc space is designated S1-S2. 2. Facet arthropathy most advanced at L4-L5 and L5-S1. 3. Mild-to-moderate right and mild left neural foraminal stenosis at L3-L4, not significantly changed. 4. At L4-L5, there is moderate spinal canal stenosis with right worse than left subarticular zone narrowing and mild-to-moderate left worse than right neural foraminal stenosis, not significantly changed since 2020. 5. At L5-S1, there is moderate spinal canal stenosis with bilateral subarticular zone narrowing and potential irritation of either traversing S1 nerve root, left worse than right, and mild-to-moderate left and mild right neural foraminal stenosis at L5-S1. The spinal canal and left subarticular zone narrowing at L5-S1 appear improved compared to the study from 2020. 6. Left worse than right subarticular zone narrowing at S1-S2 with abutment of the traversing S2 nerve roots, not significantly changed. 7. Levocurvature centered at L3 with associated right eccentric disc degeneration and degenerative endplate change at T5-T7, unchanged.      PATIENT SURVEYS:  FOTO 48 (goal is 67) 1/17:  56%  SCREENING FOR RED FLAGS: Bowel or bladder incontinence: No Spinal tumors: No Cauda equina syndrome: No Compression fracture: No Abdominal aneurysm: No  COGNITION: Overall cognitive status: Within functional limits for tasks assessed     SENSATION: WFL  MUSCLE LENGTH: Hamstrings: Right 45 deg; Left 50 deg Thomas test: Right pos ; Left slightly pos   POSTURE:  MRI notes mild scoliosis acquired   LUMBAR ROM:   AROM eval 1/17  Flexion WNL Fingers 8 inch from floor  Extension 75% 50 limited  Right lateral flexion  To joint line 25% limited  Left lateral flexion To just above joint line 25% limited  Right rotation 50% 25% limited  Left rotation 50% 25% limited   (Blank rows = not tested)  LOWER EXTREMITY ROM:     WFL  LOWER EXTREMITY MMT:    Generally 4+ to 5/5 throughout  LUMBAR SPECIAL TESTS:  Straight leg raise test: Negative  FUNCTIONAL TESTS:  5 times sit to stand: 28.68 sec  Timed up and go (TUG): 13.80 sec     1/17:   5x sit to stand 20.89 sec (hands lightly on thighs)  TUG: 11.65 (uses hands on chair)  GAIT: Distance walked: 30 Assistive device utilized: None Level of assistance: Complete Independence Comments: shuffle, poor foot clearance, antalgic  TODAY'S TREATMENT:   07/16/22: Nutsep L1 5 min warm up and discussion of status with moist heat  Supine with green ball under legs 20x Supine with Les on ball, mini bridges 10x Seated with leg on 6 inch step HS stretch 30 sec 2x right/left Seated blue ball forward flexion stretch 10xeach, the RT & LT 10x Supine Knee to chest 1x Bil 30 sec Standing gastroc and hip flexor stretch at stairs 10x right/left  Doorway UE reach up and over 10x right/left Leaning forward on high mat table: hip abduction and hip extension kicks 10x each right/left      07/14/22:  Nutsep L1 5 min warm up and discussion of status.  Seated blue ball forward flexion stretch 10xeach, the RT & LT 10x Supine Knee to chest 1x Bil 30 sec Lower trunk rotation 10x each side. Supine hamstring stretch with strap 2x 20 sec  Supine yellow loop clamshells with PPT 10x Red band supine horizontal abd with core contraction 10x Standing gastroc and hip flexor stretch at stairs   1/24: Nustep Old model L3 8 min with therapist present to discuss current status  Seated blue ball roll outs to stretch lumbar fascia 3 min Seated HS stretch 3x 30 sec holds right/left Doorway psoas stretch with UE elevation 2x 5 right/left Hip machine 40#: abduction,  extension, flexion right/left 10x each Standing lat pull 30# 10x; 35# 10x Leg press (seat 7 reclined back a bit more) Bil 60# 10x2   PATIENT EDUCATION:  Education details: Initiated HEP Person educated: Patient Education method: Consulting civil engineer, Media planner, Verbal cues, and Handouts Education comprehension: verbalized understanding, returned demonstration, and verbal cues required  HOME EXERCISE PROGRAM: Access Code: [email or web address] URL: [email or web address] Date: 06/06/2022 Prepared by: Ruben Im  Exercises - Supine Hamstring Stretch with Strap  - 1 x daily - 7 x weekly - 1 sets - 2 reps - 30 hold - Supine Figure 4 Piriformis Stretch  - 1 x daily - 7 x weekly - 1 sets - 2 reps - 30 hold - Supine Piriformis Stretch with Foot on Ground  - 1 x daily - 7 x weekly - 1 sets - 2 reps - 30 hold - Standing Quad Stretch with Table and Chair Support  - 1 x daily - 7 x weekly - 1 sets - 2 reps - 30 hold - Seated Cat Cow  - 1 x daily - 7 x weekly - 1 sets - 10 reps - Seated Table Hamstring Stretch  - 1 x daily - 7 x weekly - 1 sets - 10 reps - Supine Single Knee to Chest Stretch  - 1 x daily - 7 x weekly - 1 sets - 2 reps - 30 hold - Supine Double Knee to Chest  - 1 x daily - 7 x weekly - 1 sets - 1 reps - 30 hold   ASSESSMENT:  CLINICAL IMPRESSION:  Therapist modifying position of exercise, intensity including number of repetitions and challenge level based higher pain levels today.   His pain has changed since start of care and his affecting sleep and radiating from his back to his abdomen.  Recommended to patient his follow up with his physician regarding this change.  He does report during session and following session that he does feel better.    OBJECTIVE IMPAIRMENTS: decreased activity tolerance, decreased mobility, difficulty  walking, decreased ROM, decreased strength, hypomobility, increased fascial restrictions, increased muscle spasms, impaired flexibility, improper body  mechanics, postural dysfunction, and pain.   ACTIVITY LIMITATIONS: carrying, lifting, bending, sitting, standing, squatting, sleeping, stairs, transfers, bed mobility, and dressing  PARTICIPATION LIMITATIONS: meal prep, cleaning, laundry, driving, shopping, community activity, and yard work  PERSONAL FACTORS: Age, Fitness, Past/current experiences, Profession, Time since onset of injury/illness/exacerbation, and 1-2 comorbidities: anxiety, macular degeneration  are also affecting patient's functional outcome.   REHAB POTENTIAL: Good  CLINICAL DECISION MAKING: Stable/uncomplicated  EVALUATION COMPLEXITY: Low   GOALS: Goals reviewed with patient? Yes  SHORT TERM GOALS: Target date: 07/02/2022    Pain report to be no greater than 4/10  Baseline:  at mid day 8/10 pain Goal status:  ongoing  2.  Patient will be independent with initial HEP  Baseline:  Goal status: Goal met 06/20/21   LONG TERM GOALS: Target date: 07/30/2022    Patient to report pain no greater than 2/10  Baseline:  Goal status: INITIAL  2.  Patient to be independent with advanced HEP  Baseline:  Goal status: INITIAL  3.  FOTO to improve to 58 Baseline: 48 Goal status: INITIAL  4.  5 times sit to stand to improve by 10 sec Baseline:  Goal status: INITIAL  5.  TUG score to improve by 3-5 seconds Baseline:  Goal status: INITIAL  6.  Patient to report 85% improvement in overall symptoms  Baseline:  Goal status: INITIAL  PLAN:  PT FREQUENCY: 1-2x/week  PT DURATION: 8 weeks  PLANNED INTERVENTIONS: Therapeutic exercises, Therapeutic activity, Neuromuscular re-education, Balance training, Gait training, Patient/Family education, Self Care, Joint mobilization, Stair training, Aquatic Therapy, Dry Needling, Electrical stimulation, Spinal mobilization, Cryotherapy, Moist heat, Taping, Traction, Ultrasound, Ionotophoresis 4mg /ml Dexamethasone, Manual therapy, and Re-evaluation.  PLAN FOR NEXT SESSION: see  if patient followed up with MD;  Continue with trunk strength and flexibility  Ruben Im, PT 07/16/22 8:39 AM Phone: [phone] Fax: [phone]

## 2022-07-24 ENCOUNTER — Ambulatory Visit: Payer: Medicare Other | Attending: Neurological Surgery | Admitting: Physical Therapy

## 2022-07-24 DIAGNOSIS — R293 Abnormal posture: Secondary | ICD-10-CM | POA: Diagnosis not present

## 2022-07-24 DIAGNOSIS — M6281 Muscle weakness (generalized): Secondary | ICD-10-CM | POA: Diagnosis not present

## 2022-07-24 DIAGNOSIS — M5459 Other low back pain: Secondary | ICD-10-CM | POA: Diagnosis not present

## 2022-07-24 NOTE — Therapy (Signed)
OUTPATIENT PHYSICAL THERAPY THORACOLUMBAR PROGRESS NOTE   Patient Name: Jonathon [name] MRN: [id number] DOB:[birth year], 82 y.o., male Today's Date: 07/24/2022     END OF SESSION:  PT End of Session - 07/24/22 0804     Visit Number 14    Date for PT Re-Evaluation 07/30/22    Authorization Type BCBS MEDICARE    Progress Note Due on Visit 72    PT Start Time 0804    PT Stop Time 0844    PT Time Calculation (min) 40 min    Activity Tolerance Patient tolerated treatment well                  Past Medical History:  Diagnosis Date   Anxiety    never tried on SSRI. xanax 0.5mg  BID through Dr. Tollie Pizza.    DDD (degenerative disc disease), lumbar    lumbar DDD- follows with orthopedist Dr. Linus Orn ortho. mobic 15mg  daily and hydrocodone 5/325 prn   Family history of breast cancer    HLD (hyperlipidemia)    Macular degeneration 07/21/2016   Follows with optho- areds2. He states he is unsure of diagnosis.    Neuropathy    unknown cause ? back related(sees ortho for back). burning sensation in feet. gabapentin 100mg  after dinner, 2 before bed   Smoker    8-10 cigarettes a day. plus nicorette. 60 years. over 50 pack years   Venous stasis     in the past- Wound center on legs. [facility]. Compression stockings.    Past Surgical History:  Procedure Laterality Date   ESOPHAGOGASTRODUODENOSCOPY Left 05/28/2016    Surgeon: Juanita Craver, MD; food stuck in esophagus   LUMBAR LAMINECTOMY/DECOMPRESSION MICRODISCECTOMY Left 09/13/2020   Procedure: Left Lumbar four-five Laminectomy for facet/synovial cyst;  Surgeon: Kristeen Miss, MD;  Location: [facility];  Service: Neurosurgery;  Laterality: Left;   Patient Active Problem List   Diagnosis Date Noted   Emphysema lung ([place]) 12/03/2020   Lumbar radiculopathy, chronic 09/13/2020   Degenerative lumbar spinal stenosis 04/06/2019   OSA on CPAP 12/03/2017   Jock itch 07/31/2017   Hyperlipidemia 01/28/2017   Pulmonary nodules 12/26/2016    Aortic atherosclerosis ([place]) 10/28/2016   Genetic testing 10/14/2016   Family history of colon cancer 10/02/2016   Family history of breast cancer    Hilar lymphadenopathy 09/23/2016   Lymph nodes enlarged 09/23/2016   Family history of genetic disease 08/28/2016   Macular degeneration 07/21/2016   Neuropathy    DDD (degenerative disc disease), lumbar    GAD (generalized anxiety disorder)    Cigarette smoker     PCP: Marin Olp, MD   REFERRING PROVIDER: Kristeen Miss, MD  REFERRING DIAG: [phone] (ICD-10-CM) - Other spondylosis with radiculopathy, lumbar region  Rationale for Evaluation and Treatment: Rehabilitation  THERAPY DIAG:  Other low back pain  Abnormal posture  Muscle weakness (generalized)  ONSET DATE: 06/02/2022  SUBJECTIVE:  SUBJECTIVE STATEMENT:  Feeling better and sleeping better although he didn't sleep well last night b/c he didn't wear his CPAP.  Last back pain attack was Friday night (random I didn't do anything).  I've been doing nothing since Friday and I haven't had any attacks. I did rake some gravel yesterday and it was no problem.    PERTINENT HISTORY:  Previous cyst removal by Dr. Ellene Route  PAIN:  Are you having pain? no  PRECAUTIONS: None  WEIGHT BEARING RESTRICTIONS: No  FALLS:  Has patient fallen in last 6 months? No  LIVING ENVIRONMENT: Lives with: lives with their spouse Lives in: House/apartment  OCCUPATION: retired  PLOF: Independent, Independent with basic ADLs, Independent with household mobility without device, Independent with community mobility without device, Independent with homemaking with ambulation, Independent with gait, and Independent with transfers  PATIENT GOALS: to be able to bend, stoop and squat and do my usual outdoor work  without pain so bad that I have to lay in bed the rest of the day  NEXT MD VISIT: prn  OBJECTIVE:   DIAGNOSTIC FINDINGS:  IMPRESSION: 1. Transitional anatomy with lumbarization of the S1 vertebral body. In keeping with prior reports, the lowest fully formed disc space is designated S1-S2. 2. Facet arthropathy most advanced at L4-L5 and L5-S1. 3. Mild-to-moderate right and mild left neural foraminal stenosis at L3-L4, not significantly changed. 4. At L4-L5, there is moderate spinal canal stenosis with right worse than left subarticular zone narrowing and mild-to-moderate left worse than right neural foraminal stenosis, not significantly changed since 2020. 5. At L5-S1, there is moderate spinal canal stenosis with bilateral subarticular zone narrowing and potential irritation of either traversing S1 nerve root, left worse than right, and mild-to-moderate left and mild right neural foraminal stenosis at L5-S1. The spinal canal and left subarticular zone narrowing at L5-S1 appear improved compared to the study from 2020. 6. Left worse than right subarticular zone narrowing at S1-S2 with abutment of the traversing S2 nerve roots, not significantly changed. 7. Levocurvature centered at L3 with associated right eccentric disc degeneration and degenerative endplate change at J8-A4, unchanged.      PATIENT SURVEYS:  FOTO 48 (goal is 27) 1/17:  56%  SCREENING FOR RED FLAGS: Bowel or bladder incontinence: No Spinal tumors: No Cauda equina syndrome: No Compression fracture: No Abdominal aneurysm: No  COGNITION: Overall cognitive status: Within functional limits for tasks assessed     SENSATION: WFL  MUSCLE LENGTH: Hamstrings: Right 45 deg; Left 50 deg Thomas test: Right pos ; Left slightly pos   POSTURE:  MRI notes mild scoliosis acquired   LUMBAR ROM:   AROM eval 1/17  Flexion WNL Fingers 8 inch from floor  Extension 75% 50 limited  Right lateral flexion To joint  line 25% limited  Left lateral flexion To just above joint line 25% limited  Right rotation 50% 25% limited  Left rotation 50% 25% limited   (Blank rows = not tested)  LOWER EXTREMITY ROM:     WFL  LOWER EXTREMITY MMT:    Generally 4+ to 5/5 throughout  LUMBAR SPECIAL TESTS:  Straight leg raise test: Negative  FUNCTIONAL TESTS:  5 times sit to stand: 28.68 sec  Timed up and go (TUG): 13.80 sec     1/17:   5x sit to stand 20.89 sec (hands lightly on thighs)                 TUG: 11.65 (uses hands on chair)  GAIT: Distance  walked: 30 Assistive device utilized: None Level of assistance: Complete Independence Comments: shuffle, poor foot clearance, antalgic  TODAY'S TREATMENT:   07/24/22: Nutsep L2 7:23 min warm up to complete the lap Seated with leg on 6 inch step HS stretch 30 sec 2x right/left Seated up and over trunk/UE reach over peanut ball 5x right/left Seated blue ball forward flexion stretch 10xeach, the RT & LT 10x Seated thoracic extension stretch with small green ball 10x/with arms behind head  Standing gastroc and hip flexor stretch at stairs 10x right/left  Leaning forward on high mat table: hip abduction and hip extension kicks 10x each right/left Lat bar 35# standing 10x2 Hip machine 40#: abduction, extension, flexion right/left 5x each           07/16/22: Nutsep L1 5 min warm up and discussion of status with moist heat  Supine with green ball under legs 20x Supine with Les on ball, mini bridges 10x Seated with leg on 6 inch step HS stretch 30 sec 2x right/left Seated blue ball forward flexion stretch 10xeach, the RT & LT 10x Supine Knee to chest 1x Bil 30 sec Standing gastroc and hip flexor stretch at stairs 10x right/left  Doorway UE reach up and over 10x right/left Leaning forward on high mat table: hip abduction and hip extension kicks 10x each right/left      07/14/22:  Nutsep L1 5 min warm up and discussion of status.  Seated blue ball  forward flexion stretch 10xeach, the RT & LT 10x Supine Knee to chest 1x Bil 30 sec Lower trunk rotation 10x each side. Supine hamstring stretch with strap 2x 20 sec  Supine yellow loop clamshells with PPT 10x Red band supine horizontal abd with core contraction 10x Standing gastroc and hip flexor stretch at stairs  PATIENT EDUCATION:  Education details: Initiated HEP Person educated: Patient Education method: Consulting civil engineer, Media planner, Verbal cues, and Handouts Education comprehension: verbalized understanding, returned demonstration, and verbal cues required  HOME EXERCISE PROGRAM: Access Code: [email or web address] URL: [email or web address] Date: 06/06/2022 Prepared by: Ruben Im  Exercises - Supine Hamstring Stretch with Strap  - 1 x daily - 7 x weekly - 1 sets - 2 reps - 30 hold - Supine Figure 4 Piriformis Stretch  - 1 x daily - 7 x weekly - 1 sets - 2 reps - 30 hold - Supine Piriformis Stretch with Foot on Ground  - 1 x daily - 7 x weekly - 1 sets - 2 reps - 30 hold - Standing Quad Stretch with Table and Chair Support  - 1 x daily - 7 x weekly - 1 sets - 2 reps - 30 hold - Seated Cat Cow  - 1 x daily - 7 x weekly - 1 sets - 10 reps - Seated Table Hamstring Stretch  - 1 x daily - 7 x weekly - 1 sets - 10 reps - Supine Single Knee to Chest Stretch  - 1 x daily - 7 x weekly - 1 sets - 2 reps - 30 hold - Supine Double Knee to Chest  - 1 x daily - 7 x weekly - 1 sets - 1 reps - 30 hold   ASSESSMENT:  CLINICAL IMPRESSION:  Significantly less pain than last visit on arrival.  Patient still has pain at random but he notes it does not seem to be associated with activity.  Therapist monitoring his response throughout session and reports "no trouble with his back yet"  but can feel the muscles working.  OBJECTIVE IMPAIRMENTS: decreased activity tolerance, decreased mobility, difficulty walking, decreased ROM, decreased strength, hypomobility, increased fascial restrictions,  increased muscle spasms, impaired flexibility, improper body mechanics, postural dysfunction, and pain.   ACTIVITY LIMITATIONS: carrying, lifting, bending, sitting, standing, squatting, sleeping, stairs, transfers, bed mobility, and dressing  PARTICIPATION LIMITATIONS: meal prep, cleaning, laundry, driving, shopping, community activity, and yard work  PERSONAL FACTORS: Age, Fitness, Past/current experiences, Profession, Time since onset of injury/illness/exacerbation, and 1-2 comorbidities: anxiety, macular degeneration  are also affecting patient's functional outcome.   REHAB POTENTIAL: Good  CLINICAL DECISION MAKING: Stable/uncomplicated  EVALUATION COMPLEXITY: Low   GOALS: Goals reviewed with patient? Yes  SHORT TERM GOALS: Target date: 07/02/2022    Pain report to be no greater than 4/10  Baseline:  at mid day 8/10 pain Goal status:  ongoing  2.  Patient will be independent with initial HEP  Baseline:  Goal status: Goal met 06/20/21   LONG TERM GOALS: Target date: 07/30/2022    Patient to report pain no greater than 2/10  Baseline:  Goal status: INITIAL  2.  Patient to be independent with advanced HEP  Baseline:  Goal status: INITIAL  3.  FOTO to improve to 58 Baseline: 48 Goal status: INITIAL  4.  5 times sit to stand to improve by 10 sec Baseline:  Goal status: INITIAL  5.  TUG score to improve by 3-5 seconds Baseline:  Goal status: INITIAL  6.  Patient to report 85% improvement in overall symptoms  Baseline:  Goal status: INITIAL  PLAN:  PT FREQUENCY: 1-2x/week  PT DURATION: 8 weeks  PLANNED INTERVENTIONS: Therapeutic exercises, Therapeutic activity, Neuromuscular re-education, Balance training, Gait training, Patient/Family education, Self Care, Joint mobilization, Stair training, Aquatic Therapy, Dry Needling, Electrical stimulation, Spinal mobilization, Cryotherapy, Moist heat, Taping, Traction, Ultrasound, Ionotophoresis 4mg /ml Dexamethasone,  Manual therapy, and Re-evaluation.  PLAN FOR NEXT SESSION: check progress toward goals next visit:  TUG, FOTO;  expected discharge;  Continue with trunk strength and flexibility  , PT 07/24/22 8:42 AM Phone: [phone] Fax: [phone]

## 2022-07-29 ENCOUNTER — Ambulatory Visit: Payer: Medicare Other | Admitting: Physical Therapy

## 2022-07-29 DIAGNOSIS — R293 Abnormal posture: Secondary | ICD-10-CM | POA: Diagnosis not present

## 2022-07-29 DIAGNOSIS — M6281 Muscle weakness (generalized): Secondary | ICD-10-CM

## 2022-07-29 DIAGNOSIS — M5459 Other low back pain: Secondary | ICD-10-CM | POA: Diagnosis not present

## 2022-07-29 NOTE — Therapy (Signed)
OUTPATIENT PHYSICAL THERAPY THORACOLUMBAR PROGRESS [place] SUMMARY   Patient Name: Jonathon [name] MRN: [id number] DOB:06/28/1940, 82 y.o., male Today's Date: 07/29/2022     END OF SESSION:  PT End of Session - 07/29/22 0802     Visit Number 15    Date for PT Re-Evaluation 07/30/22    Authorization Type BCBS MEDICARE    Progress Note Due on Visit 19    PT Start Time 0800    PT Stop Time 0843    PT Time Calculation (min) 43 min    Activity Tolerance Patient tolerated treatment well                  Past Medical History:  Diagnosis Date   Anxiety    never tried on SSRI. xanax 0.1m BID through Dr. BTollie Pizza    DDD (degenerative disc disease), lumbar    lumbar DDD- follows with orthopedist Dr. NLinus Ornortho. mobic 160mdaily and hydrocodone 5/325 prn   Family history of breast cancer    HLD (hyperlipidemia)    Macular degeneration 07/21/2016   Follows with optho- areds2. He states he is unsure of diagnosis.    Neuropathy    unknown cause ? back related(sees ortho for back). burning sensation in feet. gabapentin 10046mfter dinner, 2 before bed   Smoker    8-10 cigarettes a day. plus nicorette. 60 years. over 50 pack years   Venous stasis     in the past- Wound center on legs. [facility]. Compression stockings.    Past Surgical History:  Procedure Laterality Date   ESOPHAGOGASTRODUODENOSCOPY Left 05/28/2016    Surgeon: JyoJuanita CraverD; food stuck in esophagus   LUMBAR LAMINECTOMY/DECOMPRESSION MICRODISCECTOMY Left 09/13/2020   Procedure: Left Lumbar four-five Laminectomy for facet/synovial cyst;  Surgeon: ElsKristeen MissD;  [facility]  Laterality: Left;   Patient Active Problem List   Diagnosis Date Noted   [place]   Lumbar radiculopathy, chronic 09/13/2020   Degenerative lumbar spinal stenosis 04/06/2019   OSA on CPAP 12/03/2017   Jock itch 07/31/2017   Hyperlipidemia 01/28/2017   Pulmonary  nodules 12/26/2016   [place]   Genetic testing 10/14/2016   Family history of colon cancer 10/02/2016   Family history of breast cancer    Hilar lymphadenopathy 09/23/2016   Lymph nodes enlarged 09/23/2016   Family history of genetic disease 08/28/2016   Macular degeneration 07/21/2016   Neuropathy    DDD (degenerative disc disease), lumbar    GAD (generalized anxiety disorder)    Cigarette smoker     PCP: HunMarin OlpD   REFERRING PROVIDER: ElsKristeen MissD  REFERRING DIAG: [id number]) - Other spondylosis with radiculopathy, lumbar region  Rationale for Evaluation and Treatment: Rehabilitation  THERAPY DIAG:  Other low back pain  Abnormal posture  Muscle weakness (generalized)  ONSET DATE: 06/02/2022  SUBJECTIVE:  SUBJECTIVE STATEMENT:  Reports he is sleeping well.  "I haven't had that pain in 2 weeks."    PERTINENT HISTORY:  Previous cyst removal by Dr. Ellene Route  PAIN:  Are you having pain? no  PRECAUTIONS: None  WEIGHT BEARING RESTRICTIONS: No  FALLS:  Has patient fallen in last 6 months? No  LIVING ENVIRONMENT: Lives with: lives with their spouse Lives in: House/apartment  OCCUPATION: retired  PLOF: Independent, Independent with basic ADLs, Independent with household mobility without device, Independent with community mobility without device, Independent with homemaking with ambulation, Independent with gait, and Independent with transfers  PATIENT GOALS: to be able to bend, stoop and squat and do my usual outdoor work without pain so bad that I have to lay in bed the rest of the day  NEXT MD VISIT: prn  OBJECTIVE:   DIAGNOSTIC FINDINGS:  IMPRESSION: 1. Transitional anatomy with lumbarization of the S1 vertebral  body. In keeping with prior reports, the lowest fully formed disc space is designated S1-S2. 2. Facet arthropathy most advanced at L4-L5 and L5-S1. 3. Mild-to-moderate right and mild left neural foraminal stenosis at L3-L4, not significantly changed. 4. At L4-L5, there is moderate spinal canal stenosis with right worse than left subarticular zone narrowing and mild-to-moderate left worse than right neural foraminal stenosis, not significantly changed since 2020. 5. At L5-S1, there is moderate spinal canal stenosis with bilateral subarticular zone narrowing and potential irritation of either traversing S1 nerve root, left worse than right, and mild-to-moderate left and mild right neural foraminal stenosis at L5-S1. The spinal canal and left subarticular zone narrowing at L5-S1 appear improved compared to the study from 2020. 6. Left worse than right subarticular zone narrowing at S1-S2 with abutment of the traversing S2 nerve roots, not significantly changed. 7. Levocurvature centered at L3 with associated right eccentric disc degeneration and degenerative endplate change at [id number], unchanged.      PATIENT SURVEYS:  FOTO 48 (goal is 83) 1/17:  56% 2/13:  67% goal met  SCREENING FOR RED FLAGS: Bowel or bladder incontinence: No Spinal tumors: No Cauda equina syndrome: No Compression fracture: No Abdominal aneurysm: No  COGNITION: Overall cognitive status: Within functional limits for tasks assessed     SENSATION: WFL  MUSCLE LENGTH: Hamstrings: Right 45 deg; Left 50 deg Thomas test: Right pos ; Left slightly pos   POSTURE:  MRI notes mild scoliosis acquired   LUMBAR ROM:   AROM eval 1/17 2/13  Flexion WNL Fingers 8 inch from floor Able to touch toes  Extension 75% 50 limited 25% limited  Right lateral flexion To joint line 25% limited WFLs  Left lateral flexion To just above joint line 25% limited WLS  Right rotation 50% 25% limited   Left rotation 50% 25% limited     (Blank rows = not tested)  LOWER EXTREMITY ROM:     WFL  LOWER EXTREMITY MMT:    Generally 4+ to 5/5 throughout  LUMBAR SPECIAL TESTS:  Straight leg raise test: Negative  FUNCTIONAL TESTS:  5 times sit to stand: 28.68 sec  Timed up and go (TUG): 13.80 sec     1/17:   5x sit to stand 20.89 sec (hands lightly on thighs)                 TUG: 11.65 (uses hands on chair) 2/13:  5x sit to stand 17.28 hands on thighs           TUG:  12.85 secs hands on thighs GAIT:  Distance walked: 30 Assistive device utilized: None Level of assistance: Complete Independence Comments: shuffle, poor foot clearance, antalgic  TODAY'S TREATMENT:   2/13: Nutsep L2 7:00 min warm up while discussing status and progress toward goals Lumbar ROM FOTO TUG 5x sit to stand Standing with leg on 6 inch step HS stretch 30 sec 2x right/left Seated blue ball forward flexion stretch 10xeach, the RT & LT 10x Seated thoracic extension stretch with small green ball 10x/with arms behind head  Standing gastroc stretch at the wall 5x right/left  Lat bar 35# standing 15x Hip machine 40#: abduction, extension, flexion right/left 5x each      07/24/22: Nutsep L2 7:23 min warm up to complete the lap Seated with leg on 6 inch step HS stretch 30 sec 2x right/left Seated up and over trunk/UE reach over peanut ball 5x right/left Seated blue ball forward flexion stretch 10xeach, the RT & LT 10x Seated thoracic extension stretch with small green ball 10x/with arms behind head  Standing gastroc and hip flexor stretch at stairs 10x right/left  Leaning forward on high mat table: hip abduction and hip extension kicks 10x each right/left Lat bar 35# standing 10x2 Hip machine 40#: abduction, extension, flexion right/left 5x each           07/16/22: Nutsep L1 5 min warm up and discussion of status with moist heat  Supine with green ball under legs 20x Supine with Les on ball, mini bridges 10x Seated with leg on  6 inch step HS stretch 30 sec 2x right/left Seated blue ball forward flexion stretch 10xeach, the RT & LT 10x Supine Knee to chest 1x Bil 30 sec Standing gastroc and hip flexor stretch at stairs 10x right/left  Doorway UE reach up and over 10x right/left Leaning forward on high mat table: hip abduction and hip extension kicks 10x each right/left   PATIENT EDUCATION:  Education details: Initiated HEP Person educated: Patient Education method: Consulting civil engineer, Media planner, Verbal cues, and Handouts Education comprehension: verbalized understanding, returned demonstration, and verbal cues required  HOME EXERCISE PROGRAM: Access Code: [email or web address] URL: [email or web address] Date: 06/06/2022 Prepared by: Ruben Im  Exercises - Supine Hamstring Stretch with Strap  - 1 x daily - 7 x weekly - 1 sets - 2 reps - 30 hold - Supine Figure 4 Piriformis Stretch  - 1 x daily - 7 x weekly - 1 sets - 2 reps - 30 hold - Supine Piriformis Stretch with Foot on Ground  - 1 x daily - 7 x weekly - 1 sets - 2 reps - 30 hold - Standing Quad Stretch with Table and Chair Support  - 1 x daily - 7 x weekly - 1 sets - 2 reps - 30 hold - Seated Cat Cow  - 1 x daily - 7 x weekly - 1 sets - 10 reps - Seated Table Hamstring Stretch  - 1 x daily - 7 x weekly - 1 sets - 10 reps - Supine Single Knee to Chest Stretch  - 1 x daily - 7 x weekly - 1 sets - 2 reps - 30 hold - Supine Double Knee to Chest  - 1 x daily - 7 x weekly - 1 sets - 1 reps - 30 hold   ASSESSMENT:  CLINICAL IMPRESSION:  Rates overall progress at 25% although FOTO outcome score much improved.  Good improvements in lumbar ROM and functional measures.  Declines the need for HEP update.  The patient is pleased with his current functional status and  has met the majority of rehab goals will discharge from PT at this time.      OBJECTIVE IMPAIRMENTS: decreased activity tolerance, decreased mobility, difficulty walking, decreased ROM, decreased  strength, hypomobility, increased fascial restrictions, increased muscle spasms, impaired flexibility, improper body mechanics, postural dysfunction, and pain.   ACTIVITY LIMITATIONS: carrying, lifting, bending, sitting, standing, squatting, sleeping, stairs, transfers, bed mobility, and dressing  PARTICIPATION LIMITATIONS: meal prep, cleaning, laundry, driving, shopping, community activity, and yard work  PERSONAL FACTORS: Age, Fitness, Past/current experiences, Profession, Time since onset of injury/illness/exacerbation, and 1-2 comorbidities: anxiety, macular degeneration  are also affecting patient's functional outcome.   REHAB POTENTIAL: Good  CLINICAL DECISION MAKING: Stable/uncomplicated  EVALUATION COMPLEXITY: Low   GOALS: Goals reviewed with patient? Yes  SHORT TERM GOALS: Target date: 07/02/2022    Pain report to be no greater than 4/10  Baseline:  at mid day 8/10 pain Goal status:  ongoing  2.  Patient will be independent with initial HEP  Baseline:  Goal status: Goal met 06/20/21   LONG TERM GOALS: Target date: 07/30/2022    Patient to report pain no greater than 2/10  Baseline:  Goal status: partially met  2.  Patient to be independent with advanced HEP  Baseline:  Goal status: met  3.  FOTO to improve to 58 Baseline: 48 Goal status: met  4.  5 times sit to stand to improve by 10 sec Baseline:  Goal status: met  5.  TUG score to improve by 3-5 seconds Baseline:  Goal status: partially met  6.  Patient to report 85% improvement in overall symptoms  Baseline:  Goal status: partially met "25% better"   PLAN:  PT FREQUENCY: 1-2x/week  PT DURATION: 8 weeks  PLANNED INTERVENTIONS: Therapeutic exercises, Therapeutic activity, Neuromuscular re-education, Balance training, Gait training, Patient/Family education, Self Care, Joint mobilization, Stair training, Aquatic Therapy, Dry Needling, Electrical stimulation, Spinal mobilization, Cryotherapy, Moist  heat, Taping, Traction, Ultrasound, Ionotophoresis 63m/ml Dexamethasone, Manual therapy, and Re-evaluation.  PHYSICAL THERAPY DISCHARGE SUMMARY  Visits from Start of Care: 15  Current functional level related to goals / functional outcomes: See clinical impressions above   Remaining deficits: As above   Education / Equipment: Discussion on maintaining regular activity; pt declines HEP update   Patient agrees to discharge. Patient goals were partially met. Patient is being discharged due to being pleased with the current functional level.  SRuben Im PT 07/29/22 8:44 AM Phone: [phone]: [phone]

## 2022-09-19 DIAGNOSIS — Z6829 Body mass index (BMI) 29.0-29.9, adult: Secondary | ICD-10-CM | POA: Diagnosis not present

## 2022-09-19 DIAGNOSIS — M4726 Other spondylosis with radiculopathy, lumbar region: Secondary | ICD-10-CM | POA: Diagnosis not present

## 2022-10-09 DIAGNOSIS — M5416 Radiculopathy, lumbar region: Secondary | ICD-10-CM | POA: Diagnosis not present

## 2022-10-09 DIAGNOSIS — M5116 Intervertebral disc disorders with radiculopathy, lumbar region: Secondary | ICD-10-CM | POA: Diagnosis not present

## 2022-10-09 DIAGNOSIS — M5415 Radiculopathy, thoracolumbar region: Secondary | ICD-10-CM | POA: Diagnosis not present

## 2022-10-24 ENCOUNTER — Emergency Department (HOSPITAL_COMMUNITY)
Admission: EM | Admit: 2022-10-24 | Discharge: 2022-10-24 | Disposition: A | Discharge status: Home / Self Care | Payer: Medicare Other | Attending: Emergency Medicine | Admitting: Emergency Medicine

## 2022-10-24 ENCOUNTER — Other Ambulatory Visit: Payer: Self-pay

## 2022-10-24 DIAGNOSIS — M545 Low back pain, unspecified: Secondary | ICD-10-CM | POA: Diagnosis not present

## 2022-10-24 DIAGNOSIS — F17210 Nicotine dependence, cigarettes, uncomplicated: Secondary | ICD-10-CM | POA: Insufficient documentation

## 2022-10-24 DIAGNOSIS — M5459 Other low back pain: Secondary | ICD-10-CM | POA: Diagnosis not present

## 2022-10-24 DIAGNOSIS — G8929 Other chronic pain: Secondary | ICD-10-CM | POA: Insufficient documentation

## 2022-10-24 DIAGNOSIS — Z7982 Long term (current) use of aspirin: Secondary | ICD-10-CM | POA: Insufficient documentation

## 2022-10-24 MED ORDER — KETOROLAC TROMETHAMINE 15 MG/ML IJ SOLN
15.0000 mg | Freq: Once | INTRAMUSCULAR | Status: AC
  Administered 2022-10-24: 15 mg via INTRAMUSCULAR
  Filled 2022-10-24: qty 1

## 2022-10-24 MED ORDER — DICLOFENAC SODIUM 25 MG PO TBEC: 25.0000 mg | DELAYED_RELEASE_TABLET | Freq: Two times a day (BID) | ORAL | 0 refills | Status: AC

## 2022-10-24 MED ORDER — DEXAMETHASONE SODIUM PHOSPHATE 10 MG/ML IJ SOLN
10.0000 mg | Freq: Once | INTRAMUSCULAR | Status: AC
  Administered 2022-10-24: 10 mg via INTRAMUSCULAR
  Filled 2022-10-24: qty 1

## 2022-10-24 MED ORDER — METHYLPREDNISOLONE 4 MG PO TBPK: ORAL_TABLET | ORAL | 0 refills | Status: DC

## 2022-10-24 NOTE — ED Provider Notes (Signed)
[place] EMERGENCY DEPARTMENT AT [facility] Provider Note   CSN: [id number] Arrival date & time: 10/24/22  0831     History  Chief Complaint  Patient presents with   Back Pain    Jonathon [name] is a 82 y.o. male. Patient presents to the emergency department complaining of worsening low back pain. Patient endorses a history of low back pain with lumbar laminectomy/decompression microdiscectomy in 2022. Patient states he has had continued pain in the low back for years.  The pain has worsened over the past month. He takes meloxicam for the inflammation.  He states that his neurosurgeon has told him that there is nothing else they can do for his arthritis in his back.  He states that his primary care doctor has no pain medication recommendations for the patient.  He is here seeking pain relief.  He denies saddle anesthesia, urinary incontinence, fecal incontinence, urinary retention.  Denies any trauma to the area.  Past medical history otherwise significant for anxiety, neuropathy, venous stasis, cigarette use  HPI     Home Medications Prior to Admission medications   Medication Sig Start Date End Date Taking? Authorizing Provider  diclofenac (VOLTAREN) 25 MG EC tablet Take 1 tablet (25 mg total) by mouth 2 (two) times daily. 10/24/22 11/23/22 Yes Darrick Grinder, PA-C  methylPREDNISolone (MEDROL DOSEPAK) 4 MG TBPK tablet Take as directed per package instructions 10/24/22  Yes Darrick Grinder, PA-C  ALPRAZolam Prudy Feeler) 0.5 MG tablet Take 1 tablet (0.5 mg total) by mouth 2 (two) times daily as needed for anxiety. 1 refill per month. Do not drive for 8 hours after taking. 05/21/22   Shelva Majestic, MD  aspirin EC 81 MG tablet Take 81 mg by mouth daily.    [provider]  atorvastatin (LIPITOR) 20 MG tablet TAKE 1 TABLET BY MOUTH EVERY DAY 06/26/22   Shelva Majestic, MD  cholecalciferol (VITAMIN D3) 25 MCG (1000 UNIT) tablet Take 1,000 Units by mouth daily.     [provider]  escitalopram (LEXAPRO) 5 MG tablet TAKE 1 TABLET (5 MG TOTAL) BY MOUTH DAILY. 06/26/22   Shelva Majestic, MD  gabapentin (NEURONTIN) 400 MG capsule TAKE 1 CAPSULE MIDDAY AND 2 CAPSULES AT BEDTIME. 11/14/21   Shelva Majestic, MD  meloxicam (MOBIC) 15 MG tablet TAKE 1 TABLET BY MOUTH EVERY DAY WITH EVENING MEAL 07/30/21   Kerrin Champagne, MD  Multiple Vitamins-Minerals (PRESERVISION AREDS 2 PO) Take 2 tablets by mouth daily.    [provider]  polyethylene glycol powder (GLYCOLAX/MIRALAX) 17 GM/SCOOP powder Take 17 g by mouth daily. START AFTER you have had a normal, soft BM with taking the Dulcolax or Magnesium citrate. 12/16/21   Dulce Sellar, NP      Allergies    Penicillins    Review of Systems   Review of Systems  Physical Exam Updated Vital Signs BP 134/87 (BP Location: Left Arm)   Pulse 67   Temp 97.8 F (36.6 C) (Oral)   Resp 17   Ht 5\' 9"  (1.753 m)   Wt 90.7 kg   SpO2 92%   BMI 29.53 kg/m  Physical Exam Vitals and nursing note reviewed.  Constitutional:      General: He is not in acute distress.    Appearance: He is well-developed.  HENT:     Head: Normocephalic and atraumatic.  Eyes:     Conjunctiva/sclera: Conjunctivae normal.  Cardiovascular:     Rate and Rhythm: Normal  rate and regular rhythm.     Heart sounds: No murmur heard. Pulmonary:     Effort: Pulmonary effort is normal. No respiratory distress.     Breath sounds: Normal breath sounds.  Abdominal:     Palpations: Abdomen is soft.     Tenderness: There is no abdominal tenderness.  Musculoskeletal:        General: Tenderness (Right sided lumbar region) present. No swelling.     Cervical back: Neck supple.     Comments: No midline spinal tenderness appreciated  Skin:    General: Skin is warm and dry.     Capillary Refill: Capillary refill takes less than 2 seconds.  Neurological:     General: No focal deficit present.     Mental Status: He is alert and oriented  to person, place, and time.     Sensory: No sensory deficit.     Motor: No weakness.     Comments: Equal strength and sensation in bilateral lower extremities  Psychiatric:        Mood and Affect: Mood normal.     ED Results / Procedures / Treatments   Labs (all labs ordered are listed, but only abnormal results are displayed) Labs Reviewed - No data to display  EKG None  Radiology No results found.  Procedures Procedures    Medications Ordered in ED Medications  dexamethasone (DECADRON) injection 10 mg (10 mg Intramuscular Given 10/24/22 0934)  ketorolac (TORADOL) 15 MG/ML injection 15 mg (15 mg Intramuscular Given 10/24/22 0933)    ED Course/ Medical Decision Making/ A&P                             Medical Decision Making Risk Prescription drug management.   This patient presents to the ED for concern of back pain, this involves an extensive number of treatment options, and is a complaint that carries with it a high risk of complications and morbidity.  The differential diagnosis includes exacerbation of chronic pain, herniated disc, fracture, dislocation, cauda equina, others   Co morbidities that complicate the patient evaluation  DDD   Additional history obtained:   External records from outside source obtained and reviewed including primary care notes from December 2023.  Chronic pain plan at that time involve reducing meloxicam dosage for concerns of renal function and bleeding risk issues, thoughts of follow-up visit with Dr. Danielle Dess   Imaging Studies ordered:  No red flag symptoms such as saddle anesthesia, urinary incontinence, urinary frequency, fecal incontinence.  No new trauma.  No indication for imaging at this time.   Problem List / ED Course / Critical interventions / Medication management   I ordered medication including toradol and decadron  Reevaluation of the patient after these medicines showed that the patient improved I have reviewed  the patients home medicines and have made adjustments as needed   Social Determinants of Health:  Patient smokes cigarettes   Test / Admission - Considered:  Patient seems to have an exacerbation of his chronic back pain. No red flags to suggest cauda equina. No radiculopathy. No recent trauma.  Patient does feel mildly better after medications. Plan to discharge home on course of steroids. Will prescribe diclofenac to try instead of the Meloxicam since he feels the meloxicam isn't working. Patient to follow up with primary care or his neurosurgeon for further evaluation as needed. Patient provided return precautions including warning about potential GI bleed with diclofenac usage  Final Clinical Impression(s) / ED Diagnoses Final diagnoses:  Chronic right-sided low back pain without sciatica    Rx / DC Orders ED Discharge Orders          Ordered    diclofenac (VOLTAREN) 25 MG EC tablet  2 times daily        10/24/22 0938    methylPREDNISolone (MEDROL DOSEPAK) 4 MG TBPK tablet        10/24/22 1610              Darrick Grinder, PA-C 10/24/22 9604    Loetta Rough, MD 10/24/22 1446

## 2022-10-24 NOTE — Discharge Instructions (Addendum)
You were evaluated today for back pain. I have prescribed a steroid taper to be taken as directed. I also prescribed diclofenac. You must discontinue the Meloxicam while taking the diclofenac.  If you notice signs of a GI bleed such as dark stools discontinue the diclofenac and get follow up care.  Please follow up with your primary care provider or neurosurgeon as needed for further evaluation and management.

## 2022-10-24 NOTE — ED Triage Notes (Signed)
Pt arrived via POV. C/o exacerbation of chronic back pain.  AOx4

## 2022-10-30 ENCOUNTER — Ambulatory Visit (INDEPENDENT_AMBULATORY_CARE_PROVIDER_SITE_OTHER): Payer: Medicare Other | Admitting: Family Medicine

## 2022-10-30 ENCOUNTER — Encounter: Payer: Self-pay | Admitting: Family Medicine

## 2022-10-30 VITALS — BP 138/94 | HR 79 | Temp 98.3°F | Ht 69.0 in | Wt 197.6 lb

## 2022-10-30 DIAGNOSIS — G8929 Other chronic pain: Secondary | ICD-10-CM

## 2022-10-30 DIAGNOSIS — M545 Low back pain, unspecified: Secondary | ICD-10-CM

## 2022-10-30 DIAGNOSIS — M5136 Other intervertebral disc degeneration, lumbar region: Secondary | ICD-10-CM | POA: Diagnosis not present

## 2022-10-30 DIAGNOSIS — M48061 Spinal stenosis, lumbar region without neurogenic claudication: Secondary | ICD-10-CM | POA: Diagnosis not present

## 2022-10-30 MED ORDER — HYDROCODONE-ACETAMINOPHEN 5-325 MG PO TABS: 1.0000 | ORAL_TABLET | Freq: Two times a day (BID) | ORAL | 0 refills | Status: DC | PRN

## 2022-10-30 NOTE — Patient Instructions (Addendum)
Please follow up as scheduled for your next visit with Dr. Durene Cal on May 24th to see if the pain medication is helping you.   Be cautious and careful while taking the strong pain medication. (Hydrocodone) I hope it is helpful for you.   You have the diclofenac from the ER doctor. You may use this IF you find it helpful twice a day.   If you have any questions or concerns, please don't hesitate to send me a message via MyChart or call the office at [phone]. Thank you for visiting with Jonathon [name] today! It's our pleasure caring for you.

## 2022-10-30 NOTE — Progress Notes (Signed)
Subjective  CC:  Chief Complaint  Patient presents with   Back Pain    Pt stated that he had a flare up with his back on 10/27/2022. He feels like it has gotten worse    Same day acute visit; PCP not available. New pt to me. Chart reviewed.   HPI: Jonathon [name] is a 82 y.o. male who presents to the office today to address the problems listed above in the chief complaint. 82 yo with chronic low back pain due to djd and lumbar stenosis; reviewed pcp notes, has had care from ortho and NS and reviewed recent emergency room visit from May 10.  Patient reports he is chronic pain that is not very different from his usual however, described as 9 out of 10, keeps him awake at night.  No radicular symptoms.  No recent trauma.  No weakness in lower extremities.  No bowel or bladder symptoms.  In the emergency room he was treated with Decadron, Toradol, prednisone taper and Voltaren.  He reports he did better for about 24 hours after the the ER treatment, then his pain returned full force.  In the past he has been given tramadol chronically for back pain but he says this has done nothing for him.  He has tolerated narcotic pain medicines after his surgeries.  He says he tolerates these well.  Assessment  1. Chronic midline low back pain without sciatica   2. Degenerative lumbar spinal stenosis   3. DDD (degenerative disc disease), lumbar      Plan  Chronic low back pain: Had long discussion with patient.  He appears clinically stable but with chronic pain.  He describes pain as severe.  He has failed multiple other conservative treatment measures including surgical decompression, NSAIDs, Tylenol, steroids, and tramadol.  We discussed risk and benefits of narcotic use.  Elect hydrocodone 5/325, 1-2 daily to see how he does.  He may warrant chronic pain management at this time.  He has follow-up scheduled next week with his primary care doctor where they can see how the pain management strategy is working  for him and develop a future care plan.  He is to let me know if he has any problems with his medications.  Discussed safe use of narcotics.  Follow up: As scheduled with primary care doctor, Dr. Durene Cal 11/07/2022  No orders of the defined types were placed in this encounter.  Meds ordered this encounter  Medications   HYDROcodone-acetaminophen (NORCO) 5-325 MG tablet    Sig: Take 1 tablet by mouth 2 (two) times daily as needed for severe pain.    Dispense:  30 tablet    Refill:  0      I reviewed the patients updated PMH, FH, and SocHx.    Patient Active Problem List   Diagnosis Date Noted   Emphysema lung (HCC) 12/03/2020   Lumbar radiculopathy, chronic 09/13/2020   Degenerative lumbar spinal stenosis 04/06/2019   OSA on CPAP 12/03/2017   Jock itch 07/31/2017   Hyperlipidemia 01/28/2017   Pulmonary nodules 12/26/2016   Aortic atherosclerosis (HCC) 10/28/2016   Genetic testing 10/14/2016   Family history of colon cancer 10/02/2016   Family history of breast cancer    Hilar lymphadenopathy 09/23/2016   Lymph nodes enlarged 09/23/2016   Family history of genetic disease 08/28/2016   Macular degeneration 07/21/2016   Neuropathy    DDD (degenerative disc disease), lumbar    GAD (generalized anxiety disorder)    Cigarette smoker  Current Meds  Medication Sig   ALPRAZolam (XANAX) 0.5 MG tablet Take 1 tablet (0.5 mg total) by mouth 2 (two) times daily as needed for anxiety. 1 refill per month. Do not drive for 8 hours after taking.   aspirin EC 81 MG tablet Take 81 mg by mouth daily.   atorvastatin (LIPITOR) 20 MG tablet TAKE 1 TABLET BY MOUTH EVERY DAY   cholecalciferol (VITAMIN D3) 25 MCG (1000 UNIT) tablet Take 1,000 Units by mouth daily.   diclofenac (VOLTAREN) 25 MG EC tablet Take 1 tablet (25 mg total) by mouth 2 (two) times daily.   escitalopram (LEXAPRO) 5 MG tablet TAKE 1 TABLET (5 MG TOTAL) BY MOUTH DAILY.   gabapentin (NEURONTIN) 400 MG capsule TAKE 1 CAPSULE  MIDDAY AND 2 CAPSULES AT BEDTIME.   HYDROcodone-acetaminophen (NORCO) 5-325 MG tablet Take 1 tablet by mouth 2 (two) times daily as needed for severe pain.   Multiple Vitamins-Minerals (PRESERVISION AREDS 2 PO) Take 2 tablets by mouth daily.   polyethylene glycol powder (GLYCOLAX/MIRALAX) 17 GM/SCOOP powder Take 17 g by mouth daily. START AFTER you have had a normal, soft BM with taking the Dulcolax or Magnesium citrate.    Allergies: Patient is allergic to penicillins. Family History: Patient family history includes Alcohol abuse in his brother and father; Breast cancer in his maternal aunt and sister; Breast cancer (age of onset: 33) in an other family member; Cancer in his sister; Colon cancer in his maternal uncle; Dementia in his mother; Other in his mother and sister. Social History:  Patient  reports that he has been smoking cigarettes. He started smoking about 66 years ago. He has a 15.50 pack-year smoking history. He has never used smokeless tobacco. He reports that he does not currently use alcohol. He reports that he does not use drugs.  Review of Systems: Constitutional: Negative for fever malaise or anorexia Cardiovascular: negative for chest pain Respiratory: negative for SOB or persistent cough Gastrointestinal: negative for abdominal pain  Objective  Vitals: BP (!) 138/94   Pulse 79   Temp 98.3 F (36.8 C)   Ht 5\' 9"  (1.753 m)   Wt 197 lb 9.6 oz (89.6 kg)   SpO2 99%   BMI 29.18 kg/m  General: no acute distress , A&Ox3 Back: Wearing a back brace, points to lumbar spine, decreased range of motion.  Stable gait  Commons side effects, risks, benefits, and alternatives for medications and treatment plan prescribed today were discussed, and the patient expressed understanding of the given instructions. Patient is instructed to call or message via MyChart if he/she has any questions or concerns regarding our treatment plan. No barriers to understanding were identified. We  discussed Red Flag symptoms and signs in detail. Patient expressed understanding regarding what to do in case of urgent or emergency type symptoms.  Medication list was reconciled, printed and provided to the patient in AVS. Patient instructions and summary information was reviewed with the patient as documented in the AVS. This note was prepared with assistance of Dragon voice recognition software. Occasional wrong-word or sound-a-like substitutions may have occurred due to the inherent limitations of voice recognition software

## 2022-11-07 ENCOUNTER — Encounter: Payer: Self-pay | Admitting: Family Medicine

## 2022-11-07 ENCOUNTER — Ambulatory Visit (INDEPENDENT_AMBULATORY_CARE_PROVIDER_SITE_OTHER): Payer: Medicare Other | Admitting: Family Medicine

## 2022-11-07 VITALS — BP 130/80 | HR 69 | Temp 97.2°F | Ht 69.0 in | Wt 199.2 lb

## 2022-11-07 DIAGNOSIS — F411 Generalized anxiety disorder: Secondary | ICD-10-CM

## 2022-11-07 DIAGNOSIS — L989 Disorder of the skin and subcutaneous tissue, unspecified: Secondary | ICD-10-CM

## 2022-11-07 DIAGNOSIS — M5136 Other intervertebral disc degeneration, lumbar region: Secondary | ICD-10-CM

## 2022-11-07 MED ORDER — HYDROCODONE-ACETAMINOPHEN 5-325 MG PO TABS: 1.0000 | ORAL_TABLET | Freq: Three times a day (TID) | ORAL | 0 refills | Status: DC | PRN

## 2022-11-07 NOTE — Patient Instructions (Addendum)
We have placed a referral for you today to dermatology. In some cases you will see # listed below- you can call this if you have not heard within a week. If you do not see # listed- you should receive a mychart message or phone call within a week with the # to call. Reach out to [place] if you ar enot scheduled within 2 weeks  We have placed a referral for you today to physical medicine and rehabiliation. In some cases you will see # listed below- you can call this if you have not heard within a week. If you do not see # listed- you should receive a mychart message or phone call within a week with the # to call. Reach out to [place] if you ar enot scheduled within 2 weeks  I prescribed hydrocodone that you can use up to 3x a day maximum  Must stop alprazolam due to risks along with hydrocodone  Take miralax at least half capful daily or likely will get constipation  Encourage quitting smoking as promotes inflammation which can increase pain  Recommended follow up: Return in about 1 month (around 12/08/2022) for followup or sooner if needed.Schedule b4 you leave.

## 2022-11-07 NOTE — Assessment & Plan Note (Signed)
#   GAD S: compliant with lexapro 5mg . Previously had dry mouth on zoloft 150mg  and cut to 50mg  and still had issues. Using alprazolam #37 per month as well. no falls.  No depression or anhedonia noted either    10/30/2022   11:02 AM 04/29/2019    8:03 AM 08/27/2018    8:20 AM 07/31/2017    9:05 AM  GAD 7 : Generalized Anxiety Score  Nervous, Anxious, on Edge 0 0 0 1  Control/stop worrying 0 0 0 0  Worry too much - different things 0 0 0 0  Trouble relaxing 0 0 0 0  Restless 0 0 0 0  Easily annoyed or irritable 0 0 1 1  Afraid - awful might happen 0 0 0 0  Total GAD 7 Score 0 0 1 2  Anxiety Difficulty Not difficult at all   Not difficult at all  A/P: Anxiety appears well-controlled but patient's main concern is his pain as listed above-he agrees to stop taking alprazolam at this time and continue Lexapro 5 mg due to dangers of combining benzodiazepines and narcotics

## 2022-11-07 NOTE — Progress Notes (Signed)
Phone [phone] In person visit   Subjective:   Jonathon [name] is a 82 y.o. year old very pleasant male patient who presents for/with See problem oriented charting Chief Complaint  Patient presents with   Back Pain    Pt c/o having a sore back that started about 10-5 years ago.    Past Medical History-  Patient Active Problem List   Diagnosis Date Noted   GAD (generalized anxiety disorder)     Priority: High   Cigarette smoker     Priority: High   Emphysema lung (HCC) 12/03/2020    Priority: Medium    OSA on CPAP 12/03/2017    Priority: Medium    Hyperlipidemia 01/28/2017    Priority: Medium    Pulmonary nodules 12/26/2016    Priority: Medium    Aortic atherosclerosis (HCC) 10/28/2016    Priority: Medium    Neuropathy     Priority: Medium    DDD (degenerative disc disease), lumbar     Priority: Medium    Genetic testing 10/14/2016    Priority: Low   Family history of colon cancer 10/02/2016    Priority: Low   Family history of breast cancer     Priority: Low   Hilar lymphadenopathy 09/23/2016    Priority: Low   Lymph nodes enlarged 09/23/2016    Priority: Low   Family history of genetic disease 08/28/2016    Priority: Low   Macular degeneration 07/21/2016    Priority: Low   Lumbar radiculopathy, chronic 09/13/2020   Degenerative lumbar spinal stenosis 04/06/2019   Jock itch 07/31/2017    Medications- reviewed and updated Current Outpatient Medications  Medication Sig Dispense Refill   aspirin EC 81 MG tablet Take 81 mg by mouth daily.     atorvastatin (LIPITOR) 20 MG tablet TAKE 1 TABLET BY MOUTH EVERY DAY 90 tablet 3   cholecalciferol (VITAMIN D3) 25 MCG (1000 UNIT) tablet Take 1,000 Units by mouth daily.     diclofenac (VOLTAREN) 25 MG EC tablet Take 1 tablet (25 mg total) by mouth 2 (two) times daily. 60 tablet 0   escitalopram (LEXAPRO) 5 MG tablet TAKE 1 TABLET (5 MG TOTAL) BY MOUTH DAILY. 90 tablet 3   gabapentin (NEURONTIN) 400 MG capsule TAKE  1 CAPSULE MIDDAY AND 2 CAPSULES AT BEDTIME. 270 capsule 5   Multiple Vitamins-Minerals (PRESERVISION AREDS 2 PO) Take 2 tablets by mouth daily.     polyethylene glycol powder (GLYCOLAX/MIRALAX) 17 GM/SCOOP powder Take 17 g by mouth daily. START AFTER you have had a normal, soft BM with taking the Dulcolax or Magnesium citrate. 510 g 1   HYDROcodone-acetaminophen (NORCO) 5-325 MG tablet Take 1 tablet by mouth every 8 (eight) hours as needed for severe pain (for chronic osteoarthritis of lumbar spine. do not drive for 8 hours after taking). 90 tablet 0   No current facility-administered medications for this visit.     Objective:  BP 130/80   Pulse 69   Temp (!) 97.2 F (36.2 C)   Ht 5\' 9"  (1.753 m)   Wt 199 lb 3.2 oz (90.4 kg)   SpO2 96%   BMI 29.42 kg/m  Gen: NAD, resting comfortably CV: RRR no murmurs rubs or gallops Lungs: CTAB no crackles, wheeze, rhonchi Ext: no edema Skin: warm, dry MSK: Wearing back brace-appears very uncomfortable during visit    Assessment and Plan    %-Degenerative disc disease lumbar spine- follows with Dr. Corwin Levins is on Mobic and hydrocodone (in past) and Dr.  Elsner.  Also previously seen by sports medicine-diagnosed as degenerative spinal stenosis S: recently seen in Emergency Department for substantial worsening of chronic back pain on 10/24/22- they gave steroid taper and diclofenac an had him hold the meloxicam -he has not noted much difference yet-in the past had some relief with steroid courses -saw Dr. Mardelle Matte on 10/30/22- was given a short course of hydrocodone - only mild relief with this for 6-7 hours. Pain level 5-6/10 with medicine in system and up to 10 without it in the system - -Back in 2020 had relief after steroid epidural injection But recently received steroid epidural injections through Dr. Danielle Dess with neurosurgery in April without any benefit -Dr. Danielle Dess placed patient on oxycodone short-term after surgery for L4-L5 lumbar laminotomy and  foraminotomies and partial facetectomy with excision of synovial cyst and decompression of L5 nerve root-unfortunately patient has not had sustained relief after surgery and repeat surgery not thought likely beneficial A/P: Degenerative disc disease with substantial worsening of pain - Approximately 40 to 50% improvement with hydrocodone though patient is taking every 6 hours instead of twice daily as instructed - I told him I would be willing to prescribe 3 tablets a day until we can get him into physical medicine rehabilitation (I would ask for their guidance on chronic narcotics but I am also open to continuing to prescribe these) especially if they are able to help alleviate his pain further - We discussed would not further increase the dose of medicine and the risk of tolerance and dependence if he consistently takes the medicine -Discussed signing pain contract at next visit if we continue medicine-1 month follow-up -Currently taking diclofenac but honestly I would like to get him off of this due to bleeding risk and renal risk -Discussed no driving for 8 hours after taking medicine and cannot take alprazolam anymore if he is on chronic narcotics as below  # GAD S: compliant with lexapro 5mg . Previously had dry mouth on zoloft 150mg  and cut to 50mg  and still had issues. Using alprazolam #37 per month as well. no falls.  No depression or anhedonia noted either    10/30/2022   11:02 AM 04/29/2019    8:03 AM 08/27/2018    8:20 AM 07/31/2017    9:05 AM  GAD 7 : Generalized Anxiety Score  Nervous, Anxious, on Edge 0 0 0 1  Control/stop worrying 0 0 0 0  Worry too much - different things 0 0 0 0  Trouble relaxing 0 0 0 0  Restless 0 0 0 0  Easily annoyed or irritable 0 0 1 1  Afraid - awful might happen 0 0 0 0  Total GAD 7 Score 0 0 1 2  Anxiety Difficulty Not difficult at all   Not difficult at all  A/P: Anxiety appears well-controlled but patient's main concern is his pain as listed  above-he agrees to stop taking alprazolam at this time and continue Lexapro 5 mg due to dangers of combining benzodiazepines and narcotics   # Smoking S:smoking 8-10 cigs per day. Over 50 pack years.   - lung cancer screening program trhough 80 A/P: Strongly encourage cessation-patient not ready to quit  # Hyperlipidemia/aortic atherosclerosis #CAD on CT scan S:compliant with atorvastatin 20mg . With smoking history we have opted to continue aspirin as well. Alternatively Lexapro increases bleeding risk slightly- if any future bleeding issues would d/c aspirin Aortic atherosclerosis on prior imaging as well- reasonable for statin Lab Results  Component Value Date  CHOL 125 05/21/2022   HDL 37.50 (L) 05/21/2022   LDLCALC 53 05/21/2022   LDLDIRECT 49 04/05/2020   TRIG 170.0 (H) 05/21/2022   CHOLHDL 3 05/21/2022  A/P: aortic atherosclerosis (presumed stable)- LDL goal ideally <70 as well as for coronary artery calcium-at goal on last check-continue current medication for lipids -Aspirin for primary prevention and we may need to stop this if any bleeding-risk with Lexapro, aspirin, diclofenac  # Neuropathy  S:continued issues with left foot related to back. Uses gabapentin 400 at lunch and 800 before bed- finds this helpful  A/P: This is reasonably stable-most of his current pain is in his low back-continue current medication  #Skin lesion- left forehead has likely seborrheic keratosis with some irritation. CPAP may rub on this but want to rule out skin cancer- refer to [facility] dermatology  Recommended follow up: Return in about 1 month (around 12/08/2022) for followup or sooner if needed.Schedule b4 you leave. Future Appointments  Date Time Provider Department Center  12/05/2022  9:20 AM Shelva Majestic, MD [facility]  04/27/2023  7:45 AM [facility] ANNUAL WELLNESS VISIT 1 [facility]    Lab/Order associations:   ICD-10-CM   1. DDD (degenerative disc disease), lumbar  M51.36  Ambulatory referral to Physical Medicine Rehab    2. GAD (generalized anxiety disorder)  F41.1     3. Skin lesion  L98.9 Ambulatory referral to Dermatology      Meds ordered this encounter  Medications   HYDROcodone-acetaminophen (NORCO) 5-325 MG tablet    Sig: Take 1 tablet by mouth every 8 (eight) hours as needed for severe pain (for chronic osteoarthritis of lumbar spine. do not drive for 8 hours after taking).    Dispense:  90 tablet    Refill:  0   Return precautions advised.  [name], MD

## 2022-11-13 ENCOUNTER — Telehealth: Payer: Self-pay | Admitting: Family Medicine

## 2022-11-13 ENCOUNTER — Encounter: Payer: Self-pay | Admitting: Physical Medicine & Rehabilitation

## 2022-11-13 NOTE — Telephone Encounter (Signed)
Patient states recurrent arthritis in back has been bothering him recently. Pt requests advice from pcp. I offered pt an OV with either pcp or another provider, whoever is available first, but he declined for now. Please advise.

## 2022-11-13 NOTE — Telephone Encounter (Signed)
Please schedule ov for pt to be evaluated and discuss options with Dr. Durene Cal.

## 2022-11-14 NOTE — Telephone Encounter (Signed)
Called and informed pt that an OV would be needed. Pt verbalized understanding. Patient agreed to discuss this matter at OV on 6/21 since this is the first available visit with PCP.

## 2022-11-14 NOTE — Telephone Encounter (Signed)
I look forward to seeing him on the 21st-can place on cancellation list to be worked in sooner if needed

## 2022-11-24 ENCOUNTER — Encounter: Payer: Self-pay | Admitting: Family Medicine

## 2022-11-24 ENCOUNTER — Ambulatory Visit (INDEPENDENT_AMBULATORY_CARE_PROVIDER_SITE_OTHER): Payer: Medicare Other | Admitting: Family Medicine

## 2022-11-24 VITALS — BP 112/72 | HR 100 | Temp 97.8°F | Ht 69.0 in | Wt 192.0 lb

## 2022-11-24 DIAGNOSIS — M546 Pain in thoracic spine: Secondary | ICD-10-CM | POA: Diagnosis not present

## 2022-11-24 DIAGNOSIS — M5134 Other intervertebral disc degeneration, thoracic region: Secondary | ICD-10-CM

## 2022-11-24 MED ORDER — LIDOCAINE 5 % EX PTCH: 1.0000 | MEDICATED_PATCH | CUTANEOUS | 0 refills | Status: DC

## 2022-11-24 NOTE — Patient Instructions (Addendum)
Please go to [facility]  central X-ray (updated 08/11/2019) - located [address] across the [address] from [place] - in the basement - Hours: 8:30-5:00 PM M-F (with lunch from 12:30- 1 PM). You do NOT need an appointment.    Lets try to figure out what is causing this pain with x-ray  Recommended follow up: Return for next already scheduled visit or sooner if needed.

## 2022-11-24 NOTE — Progress Notes (Signed)
Phone [phone] In person visit   Subjective:   Jonathon [name] is a 82 y.o. year old very pleasant male patient who presents for/with See problem oriented charting Chief Complaint  Patient presents with   Back Pain    Pt c/o back that started in lower back that has moved to all over his back into his ribs that started a week ago.   Past Medical History-  Patient Active Problem List   Diagnosis Date Noted   GAD (generalized anxiety disorder)     Priority: High   Cigarette smoker     Priority: High   Emphysema lung (HCC) 12/03/2020    Priority: Medium    OSA on CPAP 12/03/2017    Priority: Medium    Hyperlipidemia 01/28/2017    Priority: Medium    Pulmonary nodules 12/26/2016    Priority: Medium    Aortic atherosclerosis (HCC) 10/28/2016    Priority: Medium    Neuropathy     Priority: Medium    DDD (degenerative disc disease), lumbar     Priority: Medium    Genetic testing 10/14/2016    Priority: Low   Family history of colon cancer 10/02/2016    Priority: Low   Family history of breast cancer     Priority: Low   Hilar lymphadenopathy 09/23/2016    Priority: Low   Lymph nodes enlarged 09/23/2016    Priority: Low   Family history of genetic disease 08/28/2016    Priority: Low   Macular degeneration 07/21/2016    Priority: Low   Lumbar radiculopathy, chronic 09/13/2020   Degenerative lumbar spinal stenosis 04/06/2019   Jock itch 07/31/2017    Medications- reviewed and updated Current Outpatient Medications  Medication Sig Dispense Refill   aspirin EC 81 MG tablet Take 81 mg by mouth daily.     atorvastatin (LIPITOR) 20 MG tablet TAKE 1 TABLET BY MOUTH EVERY DAY 90 tablet 3   cholecalciferol (VITAMIN D3) 25 MCG (1000 UNIT) tablet Take 1,000 Units by mouth daily.     escitalopram (LEXAPRO) 5 MG tablet TAKE 1 TABLET (5 MG TOTAL) BY MOUTH DAILY. 90 tablet 3   gabapentin (NEURONTIN) 400 MG capsule TAKE 1 CAPSULE MIDDAY AND 2 CAPSULES AT BEDTIME. 270 capsule 5    lidocaine (LIDODERM) 5 % Place 1 patch onto the skin daily. Remove & Discard patch within 12 hours or as directed by MD 30 patch 0   Multiple Vitamins-Minerals (PRESERVISION AREDS 2 PO) Take 2 tablets by mouth daily.     polyethylene glycol powder (GLYCOLAX/MIRALAX) 17 GM/SCOOP powder Take 17 g by mouth daily. START AFTER you have had a normal, soft BM with taking the Dulcolax or Magnesium citrate. 510 g 1   HYDROcodone-acetaminophen (NORCO) 5-325 MG tablet Take 1 tablet by mouth every 8 (eight) hours as needed for severe pain (for chronic osteoarthritis of lumbar spine. do not drive for 8 hours after taking). (Patient not taking: Reported on 11/24/2022) 90 tablet 0   No current facility-administered medications for this visit.     Objective:  BP 112/72   Pulse 100   Temp 97.8 F (36.6 C)   Ht 5\' 9"  (1.753 m)   Wt 192 lb (87.1 kg)   SpO2 95%   BMI 28.35 kg/m  Gen: NAD grunts in pain most of visit  Thoracic spine pain with palpation in upper thoracic spine. Good grip strength and upper extremity strength Attempted rest of exam but patient states simply has to leave due to pain  Assessment and Plan    # severe thoracic spine/mid back pain S:looking back to CT 11/05/20 they noted "Ankylosis of much of the thoracic spine is identified along with multilevel degenerative disc disease. Unchanged remote upper thoracic spine compression deformities."   Pain started in midback about 2 weeks ago- started right after our last visit essentially. Mid to upper mid back and radiates around to his ribcage. No shortness of breath with it. No central chest pain. No left arm or neck pain. Rates pain nearly 10/10 and he has dealth with long term pain so this is rather severe. No fall or injury reported  He states not eating well in last 2 days- nauseous due to pain. Has not slept well either. Unfortunately is not getting relief from the hydrocodone-acetaminophen- taking them every 8 hours- but at present  12 hours. Still doing his gabapentin 400 mg.  A/P: 82 year old male with history of ongoing lumbar spine pain on hydrocodone 5-325 q8 hours at baseline who also has degenerative disk disease in thoracic spine based on 2022 CT chest  as well as prior compression fractures presenting with severe thoracic spine pain worsened by palpation. Most likely is flare of degenerative disk disease - but evaluate further as below - patient was in such pain today he would not let me complete full exam - he wanted to leave to get home to lay down to get some rest -agrees to come back for thoracic spine film at [facility]- rule out compression fracture or mass -wants to retrial methylprednisolone that was given in Emergency Department in may but we need to rule out fracture first. Also discussed risk of malignancy -also discussed possible Cardiovascular cause such as MI or aneurysm- I think this is less likely but offered to do further workup- such as EKG, vascular imaging- he declines and wants to start with thoracic spine films -as the hydrocodone is not helpful and we do not have culprit yet known- did not think we should increase hydrocodone- instead will trial lidocaine patches for this area of his back -Emergency Department is an option if pain worsens or fails to improve  Recommended follow up: Return for next already scheduled visit or sooner if needed. Future Appointments  Date Time Provider Department Center  12/05/2022  9:20 AM Shelva Majestic, MD [facility]  12/26/2022 11:00 AM Kirsteins, Victorino Sparrow, MD [facility]  04/27/2023  7:45 AM [facility] ANNUAL WELLNESS VISIT 1 [facility]   Lab/Order associations:   ICD-10-CM   1. Thoracic spine pain  M54.6 DG Thoracic Spine 2 View    2. DDD (degenerative disc disease), thoracic  M51.34      Meds ordered this encounter  Medications   lidocaine (LIDODERM) 5 %    Sig: Place 1 patch onto the skin daily. Remove & Discard patch within 12 hours or as directed by  MD    Dispense:  30 patch    Refill:  0   Return precautions advised.  [name], MD

## 2022-11-25 ENCOUNTER — Ambulatory Visit (INDEPENDENT_AMBULATORY_CARE_PROVIDER_SITE_OTHER)
Admission: RE | Admit: 2022-11-25 | Discharge: 2022-11-25 | Disposition: A | Discharge status: Home / Self Care | Payer: Medicare Other | Source: Ambulatory Visit | Attending: Family Medicine | Admitting: Family Medicine

## 2022-11-25 DIAGNOSIS — M546 Pain in thoracic spine: Secondary | ICD-10-CM | POA: Diagnosis not present

## 2022-12-01 DIAGNOSIS — L821 Other seborrheic keratosis: Secondary | ICD-10-CM | POA: Diagnosis not present

## 2022-12-01 DIAGNOSIS — D225 Melanocytic nevi of trunk: Secondary | ICD-10-CM | POA: Diagnosis not present

## 2022-12-01 DIAGNOSIS — L570 Actinic keratosis: Secondary | ICD-10-CM | POA: Diagnosis not present

## 2022-12-01 DIAGNOSIS — D1801 Hemangioma of skin and subcutaneous tissue: Secondary | ICD-10-CM | POA: Diagnosis not present

## 2022-12-05 ENCOUNTER — Encounter: Payer: Self-pay | Admitting: Family Medicine

## 2022-12-05 ENCOUNTER — Ambulatory Visit (INDEPENDENT_AMBULATORY_CARE_PROVIDER_SITE_OTHER): Payer: Medicare Other | Admitting: Family Medicine

## 2022-12-05 VITALS — BP 112/70 | HR 62 | Ht 69.0 in | Wt 187.0 lb

## 2022-12-05 DIAGNOSIS — J432 Centrilobular emphysema: Secondary | ICD-10-CM

## 2022-12-05 DIAGNOSIS — F411 Generalized anxiety disorder: Secondary | ICD-10-CM

## 2022-12-05 DIAGNOSIS — E785 Hyperlipidemia, unspecified: Secondary | ICD-10-CM

## 2022-12-05 DIAGNOSIS — M5136 Other intervertebral disc degeneration, lumbar region: Secondary | ICD-10-CM

## 2022-12-05 DIAGNOSIS — I700 Atherosclerosis of aorta: Secondary | ICD-10-CM

## 2022-12-05 MED ORDER — HYDROCODONE-ACETAMINOPHEN 5-325 MG PO TABS: 1.0000 | ORAL_TABLET | Freq: Four times a day (QID) | ORAL | 0 refills | Status: DC | PRN

## 2022-12-05 NOTE — Patient Instructions (Addendum)
Reschedule with pain management team to look at other options to help with pain  I will continue the hydrocodone from my end- can try every 6 hours. Watch out for constipation- consider stool softener or miralax and remain well hdyrated  Recommended follow up: Return in about 1 month (around 01/04/2023) for followup or sooner if needed.Schedule b4 you leave.

## 2022-12-05 NOTE — Progress Notes (Signed)
Phone [phone] In person visit   Subjective:   Jonathon [name] is a 82 y.o. year old very pleasant male patient who presents for/with See problem oriented charting Chief Complaint  Patient presents with   Back Pain    RM 7   Past Medical History-  Patient Active Problem List   Diagnosis Date Noted   DDD (degenerative disc disease), lumbar     Priority: High   GAD (generalized anxiety disorder)     Priority: High   Cigarette smoker     Priority: High   Emphysema lung (HCC) 12/03/2020    Priority: Medium    OSA on CPAP 12/03/2017    Priority: Medium    Hyperlipidemia 01/28/2017    Priority: Medium    Pulmonary nodules 12/26/2016    Priority: Medium    Aortic atherosclerosis (HCC) 10/28/2016    Priority: Medium    Neuropathy     Priority: Medium    Genetic testing 10/14/2016    Priority: Low   Family history of colon cancer 10/02/2016    Priority: Low   Family history of breast cancer     Priority: Low   Hilar lymphadenopathy 09/23/2016    Priority: Low   Lymph nodes enlarged 09/23/2016    Priority: Low   Family history of genetic disease 08/28/2016    Priority: Low   Macular degeneration 07/21/2016    Priority: Low   Lumbar radiculopathy, chronic 09/13/2020    Priority: 1.   Degenerative lumbar spinal stenosis 04/06/2019    Priority: 1.   Jock itch 07/31/2017    Medications- reviewed and updated Current Outpatient Medications  Medication Sig Dispense Refill   aspirin EC 81 MG tablet Take 81 mg by mouth daily.     atorvastatin (LIPITOR) 20 MG tablet TAKE 1 TABLET BY MOUTH EVERY DAY 90 tablet 3   cholecalciferol (VITAMIN D3) 25 MCG (1000 UNIT) tablet Take 1,000 Units by mouth daily.     escitalopram (LEXAPRO) 5 MG tablet TAKE 1 TABLET (5 MG TOTAL) BY MOUTH DAILY. 90 tablet 3   gabapentin (NEURONTIN) 400 MG capsule TAKE 1 CAPSULE MIDDAY AND 2 CAPSULES AT BEDTIME. 270 capsule 5   Multiple Vitamins-Minerals (PRESERVISION AREDS 2 PO) Take 2 tablets by mouth  daily.     polyethylene glycol powder (GLYCOLAX/MIRALAX) 17 GM/SCOOP powder Take 17 g by mouth daily. START AFTER you have had a normal, soft BM with taking the Dulcolax or Magnesium citrate. 510 g 1   HYDROcodone-acetaminophen (NORCO) 5-325 MG tablet Take 1 tablet by mouth every 6 (six) hours as needed for severe pain (for chronic osteoarthritis of lumbar spine. do not drive for 8 hours after taking). 120 tablet 0   No current facility-administered medications for this visit.     Objective:  BP 112/70   Pulse 62   Ht 5\' 9"  (1.753 m)   Wt 187 lb (84.8 kg)   SpO2 99%   BMI 27.62 kg/m  Gen: NAD, resting comfortably CV: RRR no murmurs rubs or gallops Lungs: CTAB no crackles, wheeze, rhonchi Ext: no edema Skin: warm, dry Moves slowly due to reported low back pain    Assessment and Plan   # GAD S: compliant with lexapro 5mg . Previously had dry mouth on zoloft 150mg  and cut to 50mg  and still had issues. Using alprazolam #37 per month as well in past- now OFF due to starting hydrocodone A/P: anxiety imperfectly controlled off alprazolam but we must remain off due to risk of combining medicines-  continue to monitor    # Hyperlipidemia/aortic atherosclerosis S:compliant with atorvastatin 20mg . With smoking history we have opted to continue aspirin as well. Alternatively Lexapro increases bleeding risk- if any future bleeding issues would d/c aspirin Aortic atherosclerosis on prior imaging as well- reasonable for statin Lab Results  Component Value Date   CHOL 125 05/21/2022   HDL 37.50 (L) 05/21/2022   LDLCALC 53 05/21/2022   LDLDIRECT 49 04/05/2020   TRIG 170.0 (H) 05/21/2022   CHOLHDL 3 05/21/2022  A/P: aortic atherosclerosis (presumed stable)- LDL goal ideally <70 - at goal- continue current medications for lipids. Not due for lipid repeat yet.    #Degenerative disc disease lumbar spine S:  History:  - followed with Dr. Otelia Sergeant in past- was on Mobic and hydrocodone-narcotics  weaned off but later restarted 2024 after worsening pain and failed surgery- later weaned off mobic due to bleeding risk -Referred to sports medicine-diagnosed as degenerative spinal stenosis -prednisone has hlepd in past but not recently -MRI done April 18, 2019 and plan  for epidural- he had this and has noted improvement.  Dr. Otelia Sergeant following again 2021 -Dr. Danielle Dess placed patient on oxycodone short-term after surgery for L4-L5 lumbar laminotomy and foraminotomies and partial facetectomy with excision of synovial cyst and decompression of L5 nerve root but unfortunately no substantial improvement in pain  Last visit:  - he also had a flare of thoracic spine pain last visit- we did x-rays which showed arthritis but thankfully no fracture or mass. We tried to do lidocaine patches but they were not able to fill them. He is not sure what flared pain up or made it calm back down  Today :  -he reports he decided to get off the lawnmower and that has been helpful (he tried a short segment of mowing and when got off flared his pain). Has to be careful about positioning like vacuuming. He is getting to point where he lets his wife drive as that helps as well.  - Pain level at moment 2-3/10. Pain gets up to about a 5 before the next dose A/P: patient with ongoing low back pain related to degenerative disk disease and lumbar stenosis/neuropathy -gabapentin 400 at lunch and 800 before bed at least helps him sleep- continue current medications  - pain controlled on hydrocodone for most part at 2-3/10 but can get up to 5 or more before next 8 hour dose- I agreed to reduce to every 6 hours but that this would be max dose from primary care perpsective -appears he cancelled PM&R appointment - he states this was not intentional- plans to call back to schedule   #Known emphysema- encouraged quitting smoking- he is not ready to quit. Primarily incidental finding on imaging but still want to be proactive. Has seen  pulmonary in past and declined inhalers  Recommended follow up: Return in about 1 month (around 01/04/2023) for followup or sooner if needed.Schedule b4 you leave. Future Appointments  Date Time Provider Department Center  01/07/2023 11:20 AM Shelva Majestic, MD [facility]  04/27/2023  7:45 AM [facility] ANNUAL WELLNESS VISIT 1 [facility]    Lab/Order associations:   ICD-10-CM   1. DDD (degenerative disc disease), lumbar  M51.36     2. GAD (generalized anxiety disorder)  F41.1     3. Aortic atherosclerosis (HCC)  I70.0     4. Hyperlipidemia, unspecified hyperlipidemia type  E78.5     5. Centrilobular emphysema (HCC)  J43.2  Meds ordered this encounter  Medications   HYDROcodone-acetaminophen (NORCO) 5-325 MG tablet    Sig: Take 1 tablet by mouth every 6 (six) hours as needed for severe pain (for chronic osteoarthritis of lumbar spine. do not drive for 8 hours after taking).    Dispense:  120 tablet    Refill:  0    Return precautions advised.  [name], MD

## 2022-12-26 ENCOUNTER — Encounter: Payer: Medicare Other | Admitting: Physical Medicine & Rehabilitation

## 2023-01-07 ENCOUNTER — Ambulatory Visit (INDEPENDENT_AMBULATORY_CARE_PROVIDER_SITE_OTHER): Payer: Medicare Other | Admitting: Family Medicine

## 2023-01-07 ENCOUNTER — Encounter: Payer: Self-pay | Admitting: Family Medicine

## 2023-01-07 VITALS — BP 118/68 | HR 75 | Temp 98.0°F | Ht 69.0 in | Wt 188.0 lb

## 2023-01-07 DIAGNOSIS — G629 Polyneuropathy, unspecified: Secondary | ICD-10-CM | POA: Diagnosis not present

## 2023-01-07 DIAGNOSIS — F411 Generalized anxiety disorder: Secondary | ICD-10-CM

## 2023-01-07 DIAGNOSIS — M5136 Other intervertebral disc degeneration, lumbar region: Secondary | ICD-10-CM | POA: Diagnosis not present

## 2023-01-07 DIAGNOSIS — E785 Hyperlipidemia, unspecified: Secondary | ICD-10-CM

## 2023-01-07 MED ORDER — HYDROCODONE-ACETAMINOPHEN 5-325 MG PO TABS: 1.0000 | ORAL_TABLET | Freq: Four times a day (QID) | ORAL | 0 refills | Status: DC | PRN

## 2023-01-07 NOTE — Progress Notes (Signed)
Phone [phone] In person visit   Subjective:   Jonathon [name] is a 82 y.o. year old very pleasant male patient who presents for/with See problem oriented charting Chief Complaint  Patient presents with   Follow-up    Feeling better, still using hydrocodone when needed. He always wears his brace.    Past Medical History-  Patient Active Problem List   Diagnosis Date Noted   DDD (degenerative disc disease), lumbar     Priority: High   GAD (generalized anxiety disorder)     Priority: High   Cigarette smoker     Priority: High   Emphysema lung (HCC) 12/03/2020    Priority: Medium    OSA on CPAP 12/03/2017    Priority: Medium    Hyperlipidemia 01/28/2017    Priority: Medium    Pulmonary nodules 12/26/2016    Priority: Medium    Aortic atherosclerosis (HCC) 10/28/2016    Priority: Medium    Neuropathy     Priority: Medium    Genetic testing 10/14/2016    Priority: Low   Family history of colon cancer 10/02/2016    Priority: Low   Family history of breast cancer     Priority: Low   Hilar lymphadenopathy 09/23/2016    Priority: Low   Lymph nodes enlarged 09/23/2016    Priority: Low   Family history of genetic disease 08/28/2016    Priority: Low   Macular degeneration 07/21/2016    Priority: Low   Lumbar radiculopathy, chronic 09/13/2020    Priority: 1.   Degenerative lumbar spinal stenosis 04/06/2019    Priority: 1.   Jock itch 07/31/2017    Medications- reviewed and updated Current Outpatient Medications  Medication Sig Dispense Refill   aspirin EC 81 MG tablet Take 81 mg by mouth daily.     atorvastatin (LIPITOR) 20 MG tablet TAKE 1 TABLET BY MOUTH EVERY DAY 90 tablet 3   cholecalciferol (VITAMIN D3) 25 MCG (1000 UNIT) tablet Take 1,000 Units by mouth daily.     escitalopram (LEXAPRO) 5 MG tablet TAKE 1 TABLET (5 MG TOTAL) BY MOUTH DAILY. 90 tablet 3   gabapentin (NEURONTIN) 400 MG capsule TAKE 1 CAPSULE MIDDAY AND 2 CAPSULES AT BEDTIME. 270 capsule 5    Multiple Vitamins-Minerals (PRESERVISION AREDS 2 PO) Take 2 tablets by mouth daily.     polyethylene glycol powder (GLYCOLAX/MIRALAX) 17 GM/SCOOP powder Take 17 g by mouth daily. START AFTER you have had a normal, soft BM with taking the Dulcolax or Magnesium citrate. 510 g 1   HYDROcodone-acetaminophen (NORCO) 5-325 MG tablet Take 1 tablet by mouth every 6 (six) hours as needed for severe pain (for chronic osteoarthritis of lumbar spine. do not drive for 8 hours after taking). 120 tablet 0   HYDROcodone-acetaminophen (NORCO) 5-325 MG tablet Take 1 tablet by mouth every 6 (six) hours as needed for severe pain (for chronic osteoarthritis of lumbar spine. do not drive for 8 hours after taking. CAN refill in 1 month). 120 tablet 0   No current facility-administered medications for this visit.     Objective:  BP 118/68   Pulse 75   Temp 98 F (36.7 C)   Ht 5\' 9"  (1.753 m)   Wt 188 lb (85.3 kg)   SpO2 99%   BMI 27.76 kg/m  Gen: NAD, resting comfortably CV: RRR no murmurs rubs or gallops Lungs: CTAB no crackles, wheeze, rhonchi     Assessment and Plan   # GAD S: compliant with lexapro 5mg .  Previously had dry mouth on zoloft 150mg  and cut to 50mg  and still had issues.  -prior  alprazolam #37 per month as well but stopped with hydrocodone being started A/P: He reports reasonable control despite not being on alprazolam anymore-very encouraging-I wonder if pain was contributing to his anxiety as it seems to be less of an issue now   # Smoking S:smoking 8-10 cigs per day. Over 50 pack years.   A/P: Continues to smoke-he is not ready to quit but I encouraged smoking cessation  # Hyperlipidemia/aortic atherosclerosis #CAD on CT scan S:compliant with atorvastatin 20mg . With smoking history we have opted to continue aspirin as well. Alternatively Lexapro increases bleeding risk- if any future bleeding issues would d/c aspirin Aortic atherosclerosis on prior imaging as well- reasonable for  statin Lab Results  Component Value Date   CHOL 125 05/21/2022   HDL 37.50 (L) 05/21/2022   LDLCALC 53 05/21/2022   LDLDIRECT 49 04/05/2020   TRIG 170.0 (H) 05/21/2022   CHOLHDL 3 05/21/2022  A/P: Cholesterol has been well-controlled in light of aortic atherosclerosis and coronary atherosclerosis/calcifications on scans.  LDL under 70-continue current medication  # Neuropathy  S:continued issues with left foot related to back. Uses gabapentin 400 at lunch and 800 before bed- finds this helpful  A/P: Reports continued reasonable control-continue current medication  #-Degenerative disc disease lumbar spine S:Medication: Hydrocodone/acetaminophen 5-3 25 up to 4 times a day with flares -he has cut back on more rigorous activities and feels pain overall is better. Hydrocodone sometimes down to 1 every other day which is encouraging as long as doesn't tweak his back.   - followed with Dr. Otelia Sergeant in past- was on Mobic and hydrocodone-narcotics weaned off but later restarted 2024 after worsening pain and failed surgery. We came off meloxicam with bleeding risk and cardiovascular risk.  -Referred to sports medicine-diagnosed as degenerative spinal stenosis -Had a flareup of pain in early October 2020-we tried a course of prednisone -MRI done April 18, 2019 and plan  for epidural- he had this and has noted improvement - Dr. Otelia Sergeant following again 2021 -Dr. Danielle Dess placed patient on oxycodone short-term after surgery for L4-L5 lumbar laminotomy and foraminotomies and partial facetectomy with excision of synovial cyst and decompression of L5 nerve rootwhich unfortunately was not helpful for pain-was told that further surgery would unlikely be helpful Prior history:  A/P: Improved control after flare at June 10 visit-doing much better overall with more infrequent use-encouraged by improvement-we opted to continue current medicine and discussed risk of tolerance with medicine and that less frequent dosing  may be helpful for this -Controlled substance contract signed 01/07/2023  -PDMP reviewed-low risk trend -UDS planned for next visit  Recommended follow up: Return in about 3 months (around 04/09/2023) for followup or sooner if needed.Schedule b4 you leave. Future Appointments  Date Time Provider Department Center  01/30/2023  9:45 AM Kirsteins, Victorino Sparrow, MD [facility]  04/27/2023  7:45 AM [facility] ANNUAL WELLNESS VISIT 1 [facility]    Lab/Order associations:   ICD-10-CM   1. DDD (degenerative disc disease), lumbar  M51.36     2. GAD (generalized anxiety disorder)  F41.1     3. Hyperlipidemia, unspecified hyperlipidemia type  E78.5     4. Neuropathy  G62.9       Meds ordered this encounter  Medications   HYDROcodone-acetaminophen (NORCO) 5-325 MG tablet    Sig: Take 1 tablet by mouth every 6 (six) hours as needed for severe pain (for chronic  osteoarthritis of lumbar spine. do not drive for 8 hours after taking).    Dispense:  120 tablet    Refill:  0   HYDROcodone-acetaminophen (NORCO) 5-325 MG tablet    Sig: Take 1 tablet by mouth every 6 (six) hours as needed for severe pain (for chronic osteoarthritis of lumbar spine. do not drive for 8 hours after taking. CAN refill in 1 month).    Dispense:  120 tablet    Refill:  0    Return precautions advised.  [name], MD

## 2023-01-07 NOTE — Assessment & Plan Note (Signed)
#-  Degenerative disc disease lumbar spine S:Medication: Hydrocodone/acetaminophen 5-3 25 up to 4 times a day with flares -he has cut back on more rigorous activities and feels pain overall is better. Hydrocodone sometimes down to 1 every other day which is encouraging as long as doesn't tweak his back.   - followed with Dr. Otelia Sergeant in past- was on Mobic and hydrocodone-narcotics weaned off but later restarted 2024 after worsening pain and failed surgery. We came off meloxicam with bleeding risk and cardiovascular risk.  -Referred to sports medicine-diagnosed as degenerative spinal stenosis -Had a flareup of pain in early October 2020-we tried a course of prednisone -MRI done April 18, 2019 and plan  for epidural- he had this and has noted improvement - Dr. Otelia Sergeant following again 2021 -Dr. Danielle Dess placed patient on oxycodone short-term after surgery for L4-L5 lumbar laminotomy and foraminotomies and partial facetectomy with excision of synovial cyst and decompression of L5 nerve rootwhich unfortunately was not helpful for pain-was told that further surgery would unlikely be helpful Prior history:  A/P: Improved control after flare at June 10 visit-doing much better overall with more infrequent use-encouraged by improvement-we opted to continue current medicine and discussed risk of tolerance with medicine and that less frequent dosing may be helpful for this -Controlled substance contract signed 01/07/2023  -PDMP reviewed-low risk trend -UDS planned for next visit

## 2023-01-07 NOTE — Patient Instructions (Addendum)
Thrilled you are doing better- we refilled 2 refills. Hoping this lasts 3 months but I can do 1 more refill if needed prior to 3 month appointment  If Ardine Bjork has not gone to lunch- lets get controlled substance contract signed today  Recommended follow up: Return in about 3 months (around 04/09/2023) for followup or sooner if needed.Schedule b4 you leave. May also want to schedule a physical for 6 months

## 2023-01-12 ENCOUNTER — Other Ambulatory Visit: Payer: Self-pay | Admitting: Family Medicine

## 2023-01-16 ENCOUNTER — Ambulatory Visit (INDEPENDENT_AMBULATORY_CARE_PROVIDER_SITE_OTHER): Payer: Medicare Other | Admitting: Nurse Practitioner

## 2023-01-16 ENCOUNTER — Encounter: Payer: Self-pay | Admitting: Nurse Practitioner

## 2023-01-16 VITALS — BP 112/60 | HR 74 | Temp 98.1°F | Resp 16 | Ht 69.0 in | Wt 188.0 lb

## 2023-01-16 DIAGNOSIS — S161XXA Strain of muscle, fascia and tendon at neck level, initial encounter: Secondary | ICD-10-CM | POA: Diagnosis not present

## 2023-01-16 MED ORDER — KETOROLAC TROMETHAMINE 30 MG/ML IJ SOLN
30.0000 mg | Freq: Once | INTRAMUSCULAR | Status: AC
Start: 2023-01-16 — End: 2023-01-16
  Administered 2023-01-16: 30 mg via INTRAMUSCULAR

## 2023-01-16 MED ORDER — PREDNISONE 10 MG (21) PO TBPK: ORAL_TABLET | ORAL | 0 refills | Status: DC

## 2023-01-16 NOTE — Progress Notes (Signed)
Acute Office Visit  Subjective:    Patient ID: DEMARRI ELIE, male    DOB: 30-Apr-1941, 82 y.o.   MRN: [id number]  Chief Complaint  Patient presents with   Neck Pain    For 3 nights -  has a sore spot at the back bottom of his head.    Neck Pain  This is a new problem. The current episode started in the past 7 days. The problem has been unchanged. The pain is associated with a sleep position. The pain is present in the right side and occipital region. The quality of the pain is described as aching and cramping. The pain is severe. The symptoms are aggravated by twisting. The pain is Same all the time. Stiffness is present All day. Pertinent negatives include no chest pain, fever, headaches, leg pain, numbness, pain with swallowing, paresis, photophobia, syncope, tingling, trouble swallowing, visual change, weakness or weight loss. He has tried acetaminophen, NSAIDs and oral narcotics (gabapentin) for the symptoms. The treatment provided no relief.   Outpatient Medications Prior to Visit  Medication Sig   aspirin EC 81 MG tablet Take 81 mg by mouth daily.   atorvastatin (LIPITOR) 20 MG tablet TAKE 1 TABLET BY MOUTH EVERY DAY   cholecalciferol (VITAMIN D3) 25 MCG (1000 UNIT) tablet Take 1,000 Units by mouth daily.   escitalopram (LEXAPRO) 5 MG tablet TAKE 1 TABLET (5 MG TOTAL) BY MOUTH DAILY.   gabapentin (NEURONTIN) 400 MG capsule TAKE 1 CAPSULE MIDDAY AND 2 CAPSULES AT BEDTIME.   HYDROcodone-acetaminophen (NORCO) 5-325 MG tablet Take 1 tablet by mouth every 6 (six) hours as needed for severe pain (for chronic osteoarthritis of lumbar spine. do not drive for 8 hours after taking).   HYDROcodone-acetaminophen (NORCO) 5-325 MG tablet Take 1 tablet by mouth every 6 (six) hours as needed for severe pain (for chronic osteoarthritis of lumbar spine. do not drive for 8 hours after taking. CAN refill in 1 month).   Multiple Vitamins-Minerals (PRESERVISION AREDS 2 PO) Take 2 tablets by mouth daily.    polyethylene glycol powder (GLYCOLAX/MIRALAX) 17 GM/SCOOP powder Take 17 g by mouth daily. START AFTER you have had a normal, soft BM with taking the Dulcolax or Magnesium citrate.   No facility-administered medications prior to visit.   Reviewed past medical and social history.  Review of Systems  Constitutional:  Negative for fever and weight loss.  HENT:  Negative for trouble swallowing.   Eyes:  Negative for photophobia.  Cardiovascular:  Negative for chest pain and syncope.  Musculoskeletal:  Positive for neck pain.  Neurological:  Negative for tingling, weakness, numbness and headaches.   Per HPI     Objective:    Physical Exam Vitals and nursing note reviewed.  Neck:     Trachea: Phonation normal.  Cardiovascular:     Rate and Rhythm: Normal rate.     Pulses: Normal pulses.  Pulmonary:     Effort: Pulmonary effort is normal.  Musculoskeletal:     Cervical back: No erythema or torticollis. Pain with movement and muscular tenderness present. No spinous process tenderness. Decreased range of motion.  Lymphadenopathy:     Cervical: No cervical adenopathy.  Skin:    Findings: No rash.  Neurological:     Mental Status: He is alert and oriented to person, place, and time.    BP 112/60 (BP Location: Left Arm, Patient Position: Sitting, Cuff Size: Large)   Pulse 74   Temp 98.1 F (36.7 C) (Temporal)  Resp 16   Ht 5\' 9"  (1.753 m)   Wt 188 lb (85.3 kg)   SpO2 96%   BMI 27.76 kg/m    No results found for any visits on 01/16/23.     Assessment & Plan:   Problem List Items Addressed This Visit   None Visit Diagnoses     Acute strain of neck muscle, initial encounter    -  Primary   Relevant Medications   ketorolac (TORADOL) 30 MG/ML injection 30 mg (Completed)   predniSONE (STERAPRED UNI-PAK 21 TAB) 10 MG (21) TBPK tablet      Meds ordered this encounter  Medications   ketorolac (TORADOL) 30 MG/ML injection 30 mg   predniSONE (STERAPRED UNI-PAK 21 TAB)  10 MG (21) TBPK tablet    Sig: As directed on package    Dispense:  21 tablet    Refill:  0    Order Specific Question:   Supervising Provider    Answer:   Mliss Sax [[id number]]  Advised to Use soft neck collar, Continue hydrocodone as needed Start oral prednisone as prescribed, take with food Do not take ibuprofen or advil or aleve or motrin while taking oral prednisone. Use warm compress every 2hrs while awake.  Return if symptoms worsen or fail to improve.  Alysia Penna, NP

## 2023-01-16 NOTE — Progress Notes (Signed)
After obtaining consent, and per orders of Alysia Penna NP  injection of Ketorolac 30 mg  given by Olga Coaster. Patient instructed to remain in clinic for 20 minutes afterwards, and to report any adverse reaction to me immediately.

## 2023-01-16 NOTE — Patient Instructions (Signed)
Use soft neck collar Continue hydrocodone as needed Start oral prednisone as prescribed, take with food Do not take ibuprofen or advil or aleve or motrin while taking oral prednisone. Use warm compress every 2hrs while awake.  Cervical Sprain A cervical sprain is also called a neck sprain. It is a stretch or tear in one or more ligaments in the neck. Ligaments are tissues that connect bones to each other. Neck sprains can be mild, bad, or very bad. A very bad sprain can cause problems with bones and other structures.  Most neck sprains heal in 4-6 weeks, but this depends on how bad the injury is. What are the causes? Neck sprains may be caused by trauma, such as: An injury from a motor vehicle accident. A fall. The head and neck being moved front to back or side to side all of a sudden (whiplash injury). Mild neck sprains may be caused by wear and tear over time. What increases the risk? Some activities put you at risk of hurting your neck. These include: Contact sports. Gymnastics. Diving. Arthritis caused by wear and tear of the joints in the spine. The neck not being very strong or flexible. Having had a neck injury in the past. Poor posture. Spending a lot of time in positions that put stress on the neck. This may be from sitting at a computer for a long time. What are the signs or symptoms? Signs or symptoms include any of these problems in your neck, shoulders, or upper back: Pain or tenderness. Stiffness. Swelling. A burning feeling. Sudden tightening of neck muscles (spasms). Not being able to move the neck very much. Headache. Feeling dizzy. Feeling like you may vomit, or vomiting. Having a hand or arm that: Feels weak. Loses feeling (feels numb). Tingles. How is this treated? This condition is treated by: Resting and putting ice or heat on your neck. Doing exercises to improve movement and strength in the injured area (physical therapy). In some cases, treatment  may also include: Keeping your neck in place for a length of time. This may be done using: A neck collar. This supports your chin and the back of your head. A cervical traction device. This is a sling that holds up your head. It removes weight and pressure from your neck. Medicines for pain or other symptoms. Surgery. This is rare. Follow these instructions at home: Medicines Take over-the-counter and prescription medicines only as told by your doctor. Ask your doctor if you should avoid driving or using machines while you are taking your medicine. If told, take steps to prevent problems with pooping (constipation). You may need to: Drink enough fluid to keep your pee (urine) pale yellow. Take medicines. You will be told what medicines to take. Eat foods that are high in fiber. These include beans, whole grains, and fresh fruits and vegetables. Limit foods that are high in fat and sugar. These include fried or sweet foods. If you have a neck collar: Wear it as told by your doctor. Do not take it off unless told. Ask your doctor before adjusting your collar. If you have long hair, keep it outside of the collar. If you are allowed to take off the collar for cleaning and bathing: Follow instructions about how to take it off safely. Clean it by hand with mild soap and water. Let it air-dry fully. If your collar has pads that you can take out: Take the pads out every 1-2 days. Wash them by hand with soap and  water. Let the pads air-dry fully before you put them back in the collar. Tell your doctor if your skin under the collar has irritation or sores. Managing pain, stiffness, and swelling     Use a cervical traction device, if told by your doctor. If told, put ice on the affected area. Put ice in a plastic bag. Place a towel between your skin and the bag. Leave the ice on for 20 minutes, 2-3 times a day. If told, put heat on the affected area. Do this before exercise or as often as  told by your doctor. Use the heat source that your doctor recommends, such as a moist heat pack or a heating pad. Place a towel between your skin and the heat source. Leave the heat on for 20-30 minutes. If your skin turns bright red, take off the ice or heat right away to prevent skin damage. The risk of damage is higher if you cannot feel pain, heat, or cold. Activity Do not drive while wearing a neck collar. If you do not have a neck collar, ask if it is safe to drive while your neck heals. Do not lift anything that is heavier than 10 lb (4.5 kg). Rest as told by your doctor. Avoid positions and activities that make you feel worse. Do exercises as told by your doctor or physical therapist. Return to your normal activities when your doctor says that it is safe. General instructions Do not smoke or use any products that contain nicotine or tobacco. These can delay healing. If you need help quitting, ask your doctor. Keep all follow-up visits. Your doctor will check your injury and activity level. How is this prevented? Use good posture. Adjust your workstation to help with this. Exercise often as told by your doctor or physical therapist. Avoid activities that are risky or may cause a neck sprain. Contact a doctor if: Your symptoms get worse or do not get better after 2 weeks. Your pain gets worse. Medicine does not help your pain. You have new symptoms. Your neck collar gives you sores on your skin or bothers your skin. Get help right away if: You have very bad pain. You get any of the following in any part of your body: Loss of feeling. Tingling. Weakness. You cannot move a part of your body. You have neck pain and either of these: Very bad dizziness. A very bad headache. This information is not intended to replace advice given to you by your health care provider. Make sure you discuss any questions you have with your health care provider. Document Revised: 01/03/2022 Document  Reviewed: 01/03/2022 Elsevier Patient Education  2024 [employer].

## 2023-01-30 ENCOUNTER — Encounter: Payer: Medicare Other | Admitting: Physical Medicine & Rehabilitation

## 2023-04-27 ENCOUNTER — Ambulatory Visit (INDEPENDENT_AMBULATORY_CARE_PROVIDER_SITE_OTHER): Payer: Medicare Other

## 2023-04-27 VITALS — BP 110/70 | HR 83 | Temp 98.7°F | Wt 198.2 lb

## 2023-04-27 DIAGNOSIS — Z0000 Encounter for general adult medical examination without abnormal findings: Secondary | ICD-10-CM

## 2023-04-27 DIAGNOSIS — Z23 Encounter for immunization: Secondary | ICD-10-CM

## 2023-04-27 NOTE — Patient Instructions (Signed)
[name] , Thank you for taking time to come for your Medicare Wellness Visit. I appreciate your ongoing commitment to your health goals. Please review the following plan we discussed and let me know if I can assist you in the future.   Referrals/Orders/Follow-Ups/Clinician Recommendations: maintain health and activity   This is a list of the screening recommended for you and due dates:  Health Maintenance  Topic Date Due   Flu Shot  01/15/2023   COVID-19 Vaccine (5 - 2023-24 season) 02/15/2023   Medicare Annual Wellness Visit  04/22/2023   DTaP/Tdap/Td vaccine (2 - Td or Tdap) 03/20/2029   Pneumonia Vaccine  Completed   Zoster (Shingles) Vaccine  Completed   HPV Vaccine  Aged Out   Hepatitis C Screening  Discontinued    Advanced directives: (Copy Requested) Please bring a copy of your health care power of attorney and living will to the office to be added to your chart at your convenience.  Next Medicare Annual Wellness Visit scheduled for next year: Yes

## 2023-04-27 NOTE — Progress Notes (Signed)
Subjective:   Jonathon [name] is a 82 y.o. male who presents for Medicare Annual/Subsequent preventive examination.  Visit Complete: In person  Cardiac Risk Factors include: advanced age (>17men, >52 women);male gender;dyslipidemia     Objective:    Today's Vitals   04/27/23 0745  BP: 110/70  Pulse: 83  Temp: 98.7 F (37.1 C)  SpO2: 96%  Weight: 198 lb 3.2 oz (89.9 kg)   Body mass index is 29.27 kg/m.     04/27/2023    7:56 AM 10/24/2022    8:42 AM 06/04/2022    9:52 AM 04/21/2022    8:09 AM 04/05/2021    3:19 PM 09/13/2020    8:59 AM 03/30/2020    3:28 PM  Advanced Directives  Does Patient Have a Medical Advance Directive? Yes No Yes Yes Yes No No  Type of Estate agent of [place];Living will  Healthcare Power of [place];Living will Healthcare Power of [place];Living will Healthcare Power of Attorney    Does patient want to make changes to medical advance directive?   No - Patient declined      Copy of Healthcare Power of Attorney in Chart? No - copy requested  Yes - validated most recent copy scanned in chart (See row information) No - copy requested No - copy requested    Would patient like information on creating a medical advance directive?      No - Patient declined --    Current Medications (verified) Outpatient Encounter Medications as of 04/27/2023  Medication Sig   aspirin EC 81 MG tablet Take 81 mg by mouth daily.   atorvastatin (LIPITOR) 20 MG tablet TAKE 1 TABLET BY MOUTH EVERY DAY   cholecalciferol (VITAMIN D3) 25 MCG (1000 UNIT) tablet Take 1,000 Units by mouth daily.   escitalopram (LEXAPRO) 5 MG tablet TAKE 1 TABLET (5 MG TOTAL) BY MOUTH DAILY.   gabapentin (NEURONTIN) 400 MG capsule TAKE 1 CAPSULE MIDDAY AND 2 CAPSULES AT BEDTIME.   HYDROcodone-acetaminophen (NORCO) 5-325 MG tablet Take 1 tablet by mouth every 6 (six) hours as needed for severe pain (for chronic osteoarthritis of lumbar spine. do not drive for 8 hours after  taking).   Multiple Vitamins-Minerals (PRESERVISION AREDS 2 PO) Take 2 tablets by mouth daily.   polyethylene glycol powder (GLYCOLAX/MIRALAX) 17 GM/SCOOP powder Take 17 g by mouth daily. START AFTER you have had a normal, soft BM with taking the Dulcolax or Magnesium citrate.   predniSONE (STERAPRED UNI-PAK 21 TAB) 10 MG (21) TBPK tablet As directed on package   [DISCONTINUED] HYDROcodone-acetaminophen (NORCO) 5-325 MG tablet Take 1 tablet by mouth every 6 (six) hours as needed for severe pain (for chronic osteoarthritis of lumbar spine. do not drive for 8 hours after taking. CAN refill in 1 month).   No facility-administered encounter medications on file as of 04/27/2023.    Allergies (verified) Penicillins   History: Past Medical History:  Diagnosis Date   Anxiety    never tried on SSRI. xanax 0.5mg  BID through Dr. Doristine Counter.    DDD (degenerative disc disease), lumbar    lumbar DDD- follows with orthopedist Dr. Leron Croak ortho. mobic 15mg  daily and hydrocodone 5/325 prn   Family history of breast cancer    HLD (hyperlipidemia)    Macular degeneration 07/21/2016   Follows with optho- areds2. He states he is unsure of diagnosis.    Neuropathy    unknown cause ? back related(sees ortho for back). burning sensation in feet. gabapentin 100mg  after dinner,  2 before bed   Smoker    8-10 cigarettes a day. plus nicorette. 60 years. over 50 pack years   Venous stasis     in the past- Wound center on legs. Elevate. Compression stockings.    Past Surgical History:  Procedure Laterality Date   ESOPHAGOGASTRODUODENOSCOPY Left 05/28/2016    Surgeon: Charna Elizabeth, MD; food stuck in esophagus   LUMBAR LAMINECTOMY/DECOMPRESSION MICRODISCECTOMY Left 09/13/2020   Procedure: Left Lumbar four-five Laminectomy for facet/synovial cyst;  Surgeon: Barnett Abu, MD;  [facility];  Service: Neurosurgery;  Laterality: Left;   Family History  Problem Relation Age of Onset   Other Mother        "old  age" per patient    Dementia Mother    Alcohol abuse Father        contributed   Cancer Sister        unknown type   Alcohol abuse Brother    Breast cancer Sister        dx in her early 31s   Other Sister        PALB2+   Breast cancer Maternal Aunt    Colon cancer Maternal Uncle    Breast cancer Other 66   Social History   Socioeconomic History   Marital status: Married    Spouse name: Not on file   Number of children: Not on file   Years of education: Not on file   Highest education level: Not on file  Occupational History   Occupation: semi retired  Tobacco Use   Smoking status: Former    Current packs/day: 0.25    Average packs/day: 0.3 packs/day for 66.9 years (16.7 ttl pk-yrs)    Types: Cigarettes    Start date: 1958   Smokeless tobacco: Never   Tobacco comments:    02/10/19- .5 pack   Vaping Use   Vaping status: Never Used  Substance and Sexual Activity   Alcohol use: Not Currently    Comment: rare drinking occasionally   Drug use: No   Sexual activity: Not on file  Other Topics Concern   Not on file  Social History Narrative   Married around 1964. 3 children. 6 grandkids.       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, molecular products on the side   Social Determinants of Health   Financial Resource Strain: Low Risk  (04/27/2023)   Overall Financial Resource Strain (CARDIA)    Difficulty of Paying Living Expenses: Not hard at all  Food Insecurity: No Food Insecurity (04/27/2023)   Hunger Vital Sign    Worried About Running Out of Food in the Last Year: Never true    Ran Out of Food in the Last Year: Never true  Transportation Needs: No Transportation Needs (04/27/2023)   PRAPARE - Administrator, Civil Service (Medical): No    Lack of Transportation (Non-Medical): No  Physical Activity: Inactive (04/27/2023)   Exercise Vital Sign    Days of Exercise per Week: 0 days    Minutes of Exercise per Session: 0  min  Stress: No Stress Concern Present (04/27/2023)   [employer] of Occupational Health - Occupational Stress Questionnaire    Feeling of Stress : Not at all  Social Connections: Moderately Isolated (04/27/2023)   Social Connection and Isolation Panel [NHANES]    Frequency of Communication with Friends and Family: Once a week    Frequency of Social Gatherings with Friends and Family: More than three times  a week    Attends Religious Services: Never    Active Member of Clubs or Organizations: No    Attends Banker Meetings: Never    Marital Status: Married    Tobacco Counseling Counseling given: Not Answered Tobacco comments: 02/10/19- .5 pack    Clinical Intake:  Pre-visit preparation completed: Yes  Pain : No/denies pain     BMI - recorded: 29.27 Nutritional Status: BMI 25 -29 Overweight Nutritional Risks: None Diabetes: No  How often do you need to have someone help you when you read instructions, pamphlets, or other written materials from your doctor or pharmacy?: 1 - Never  Interpreter Needed?: No  Information entered by :: Lanier Ensign, LPN   Activities of Daily Living    04/27/2023    7:52 AM  In your present state of health, do you have any difficulty performing the following activities:  Hearing? 1  Comment pt stated [facility]  Vision? 0  Difficulty concentrating or making decisions? 0  Walking or climbing stairs? 0  Dressing or bathing? 0  Doing errands, shopping? 0  Preparing Food and eating ? N  Using the Toilet? N  In the past six months, have you accidently leaked urine? N  Do you have problems with loss of bowel control? N  Managing your Medications? N  Managing your Finances? N  Housekeeping or managing your Housekeeping? N    Patient Care Team: Shelva Majestic, MD as PCP - General (Family Medicine) Oretha Milch, MD as Consulting Physician (Pulmonary Disease) Mateo Flow, MD as Consulting Physician  (Ophthalmology) Charna Elizabeth, MD as Consulting Physician (Gastroenterology) Clinic, Lenn Sink as Consulting Physician  Indicate any recent Medical Services you may have received from other than [facility] providers in the past year (date may be approximate).     Assessment:   This is a routine wellness examination for Rajender.  Hearing/Vision screen Hearing Screening - Comments:: Pt stated [facility]  Vision Screening - Comments:: Pt follows up with [facility] for annual eye exams    Goals Addressed             This Visit's Progress    Patient Stated       Maintain  health and activity        Depression Screen    04/27/2023    7:56 AM 01/07/2023   11:19 AM 10/30/2022   11:02 AM 04/21/2022    8:08 AM 04/05/2021    3:18 PM 10/04/2020    8:50 AM 03/30/2020    3:26 PM  PHQ 2/9 Scores  PHQ - 2 Score 0 0 0 0 0 0 0  PHQ- 9 Score      0     Fall Risk    04/27/2023    7:59 AM 01/07/2023   11:19 AM 10/30/2022   11:02 AM 05/21/2022   11:10 AM 04/21/2022    8:10 AM  Fall Risk   Falls in the past year? 0 0  0 0  Number falls in past yr: 0 0 1 0 0  Injury with Fall? 0 0 0 0 0  Risk for fall due to : Impaired vision  History of fall(s) No Fall Risks Impaired vision  Follow up Falls prevention discussed  Falls evaluation completed  Falls prevention discussed    MEDICARE RISK AT HOME: Medicare Risk at Home Any stairs in or around the home?: Yes If so, are there any without handrails?: No Home free of loose throw rugs in walkways,  pet beds, electrical cords, etc?: Yes Adequate lighting in your home to reduce risk of falls?: Yes Life alert?: No Use of a cane, walker or w/c?: No Grab bars in the bathroom?: Yes Shower chair or bench in shower?: No Elevated toilet seat or a handicapped toilet?: No  TIMED UP AND GO:  Was the test performed?  Yes  Length of time to ambulate 10 feet: 10 sec Gait steady and fast without use of assistive device    Cognitive Function:    12/03/2017    10:36 AM  MMSE - Mini Mental State Exam  Not completed: --        04/27/2023    7:59 AM 04/21/2022    8:11 AM 04/05/2021    3:20 PM 03/30/2020    3:34 PM 03/16/2019    3:01 PM  6CIT Screen  What Year? 0 points 0 points 0 points 0 points 0 points  What month? 3 points 0 points 0 points 0 points 0 points  What time? 0 points 0 points 0 points  0 points  Count back from 20 0 points 0 points 0 points 0 points 0 points  Months in reverse 4 points 4 points 4 points 4 points 0 points  Repeat phrase 0 points 0 points 0 points 0 points 2 points  Total Score 7 points 4 points 4 points  2 points    Immunizations Immunization History  Administered Date(s) Administered   Fluad Quad(high Dose 65+) 02/10/2019, 03/01/2020, 03/20/2021, 04/21/2022   Fluad Trivalent(High Dose 65+) 04/27/2023   Influenza Split 03/16/2014, 03/17/2015, 02/15/2016   Influenza, High Dose Seasonal PF 05/18/2015, 03/18/2016, 02/14/2017, 03/26/2017, 04/16/2018, 06/04/2018   Influenza-Unspecified 04/16/2012, 05/18/2015, 03/18/2016, 04/30/2016, 03/17/2019, 03/16/2020, 03/16/2021, 06/18/2022   Moderna Sars-Covid-2 Vaccination 07/07/2019, 08/04/2019, 07/04/2020, 12/14/2020   Pneumococcal Conjugate-13 01/31/2014   Pneumococcal Polysaccharide-23 12/03/2017   Pneumococcal-Unspecified 03/17/2011   Tdap 03/21/2019   Zoster Recombinant(Shingrix) 08/10/2020, 11/19/2020   Zoster, Live 07/06/2013    TDAP status: Up to date  Flu Vaccine status: Completed at today's visit  Pneumococcal vaccine status: Up to date  Covid-19 vaccine status: Information provided on how to obtain vaccines.   Qualifies for Shingles Vaccine? Yes   Zostavax completed Yes   Shingrix Completed?: Yes  Screening Tests Health Maintenance  Topic Date Due   COVID-19 Vaccine (5 - 2023-24 season) 02/15/2023   Medicare Annual Wellness (AWV)  04/26/2024   DTaP/Tdap/Td (2 - Td or Tdap) 03/20/2029   Pneumonia Vaccine 29+ Years old  Completed   INFLUENZA  VACCINE  Completed   Zoster Vaccines- Shingrix  Completed   HPV VACCINES  Aged Out   Hepatitis C Screening  Discontinued    Health Maintenance  Health Maintenance Due  Topic Date Due   COVID-19 Vaccine (5 - 2023-24 season) 02/15/2023    Colorectal cancer screening: No longer required.    Additional Screening:  Vision Screening: Recommended annual ophthalmology exams for early detection of glaucoma and other disorders of the eye. Is the patient up to date with their annual eye exam?  Yes  Who is the provider or what is the name of the office in which the patient attends annual eye exams? [facility]  If pt is not established with a provider, would they like to be referred to a provider to establish care? No .   Dental Screening: Recommended annual dental exams for proper oral hygiene   Community Resource Referral / Chronic Care Management: CRR required this visit?  No   CCM required  this visit?  No     Plan:     I have personally reviewed and noted the following in the patient's chart:   Medical and social history Use of alcohol, tobacco or illicit drugs  Current medications and supplements including opioid prescriptions. Patient is not currently taking opioid prescriptions. Functional ability and status Nutritional status Physical activity Advanced directives List of other physicians Hospitalizations, surgeries, and ER visits in previous 12 months Vitals Screenings to include cognitive, depression, and falls Referrals and appointments  In addition, I have reviewed and discussed with patient certain preventive protocols, quality metrics, and best practice recommendations. A written personalized care plan for preventive services as well as general preventive health recommendations were provided to patient.     Marzella Schlein, LPN   16/03/9603   After Visit Summary: (In Person-Declined) Patient declined AVS at this time.  Nurse Notes: none

## 2023-05-11 ENCOUNTER — Encounter: Payer: Self-pay | Admitting: Family Medicine

## 2023-05-11 ENCOUNTER — Ambulatory Visit (INDEPENDENT_AMBULATORY_CARE_PROVIDER_SITE_OTHER): Payer: Medicare Other | Admitting: Family Medicine

## 2023-05-11 VITALS — BP 124/70 | HR 70 | Temp 97.9°F | Ht 69.0 in | Wt 197.4 lb

## 2023-05-11 DIAGNOSIS — F17210 Nicotine dependence, cigarettes, uncomplicated: Secondary | ICD-10-CM

## 2023-05-11 DIAGNOSIS — F411 Generalized anxiety disorder: Secondary | ICD-10-CM | POA: Diagnosis not present

## 2023-05-11 DIAGNOSIS — R809 Proteinuria, unspecified: Secondary | ICD-10-CM

## 2023-05-11 DIAGNOSIS — Z79899 Other long term (current) drug therapy: Secondary | ICD-10-CM | POA: Diagnosis not present

## 2023-05-11 DIAGNOSIS — E785 Hyperlipidemia, unspecified: Secondary | ICD-10-CM

## 2023-05-11 LAB — MICROALBUMIN / CREATININE URINE RATIO
Creatinine,U: 139.4 mg/dL
Microalb Creat Ratio: 0.6 mg/g (ref 0.0–30.0)
Microalb, Ur: 0.8 mg/dL (ref 0.0–1.9)

## 2023-05-11 LAB — URINALYSIS, ROUTINE W REFLEX MICROSCOPIC
Bilirubin Urine: NEGATIVE
Hgb urine dipstick: NEGATIVE
Ketones, ur: NEGATIVE
Leukocytes,Ua: NEGATIVE
Nitrite: NEGATIVE
RBC / HPF: NONE SEEN (ref 0–?)
Specific Gravity, Urine: 1.025 (ref 1.000–1.030)
Urine Glucose: NEGATIVE
Urobilinogen, UA: 0.2 (ref 0.0–1.0)
pH: 6 (ref 5.0–8.0)

## 2023-05-11 LAB — CBC WITH DIFFERENTIAL/PLATELET
Basophils Absolute: 0 10*3/uL (ref 0.0–0.1)
Basophils Relative: 0.7 % (ref 0.0–3.0)
Eosinophils Absolute: 0.3 10*3/uL (ref 0.0–0.7)
Eosinophils Relative: 5.7 % — ABNORMAL HIGH (ref 0.0–5.0)
HCT: 39.6 % (ref 39.0–52.0)
Hemoglobin: 12.9 g/dL — ABNORMAL LOW (ref 13.0–17.0)
Lymphocytes Relative: 24.6 % (ref 12.0–46.0)
Lymphs Abs: 1.2 10*3/uL (ref 0.7–4.0)
MCHC: 32.6 g/dL (ref 30.0–36.0)
MCV: 96 fL (ref 78.0–100.0)
Monocytes Absolute: 0.6 10*3/uL (ref 0.1–1.0)
Monocytes Relative: 12.1 % — ABNORMAL HIGH (ref 3.0–12.0)
Neutro Abs: 2.7 10*3/uL (ref 1.4–7.7)
Neutrophils Relative %: 56.9 % (ref 43.0–77.0)
Platelets: 258 10*3/uL (ref 150.0–400.0)
RBC: 4.12 Mil/uL — ABNORMAL LOW (ref 4.22–5.81)
RDW: 14 % (ref 11.5–15.5)
WBC: 4.8 10*3/uL (ref 4.0–10.5)

## 2023-05-11 LAB — COMPREHENSIVE METABOLIC PANEL
ALT: 14 U/L (ref 0–53)
AST: 19 U/L (ref 0–37)
Albumin: 3.6 g/dL (ref 3.5–5.2)
Alkaline Phosphatase: 104 U/L (ref 39–117)
BUN: 14 mg/dL (ref 6–23)
CO2: 30 meq/L (ref 19–32)
Calcium: 9 mg/dL (ref 8.4–10.5)
Chloride: 104 meq/L (ref 96–112)
Creatinine, Ser: 1.01 mg/dL (ref 0.40–1.50)
GFR: 69.25 mL/min (ref >60.00)
Glucose, Bld: 64 mg/dL — ABNORMAL LOW (ref 70–99)
Potassium: 3.7 meq/L (ref 3.5–5.1)
Sodium: 140 meq/L (ref 135–145)
Total Bilirubin: 0.5 mg/dL (ref 0.2–1.2)
Total Protein: 6.5 g/dL (ref 6.0–8.3)

## 2023-05-11 LAB — LIPID PANEL
Cholesterol: 99 mg/dL (ref 0–200)
HDL: 31.2 mg/dL — ABNORMAL LOW (ref >39.00)
LDL Cholesterol: 50 mg/dL (ref 0–99)
NonHDL: 68.15
Total CHOL/HDL Ratio: 3
Triglycerides: 93 mg/dL (ref 0.0–149.0)
VLDL: 18.6 mg/dL (ref 0.0–40.0)

## 2023-05-11 MED ORDER — KETOCONAZOLE 2 % EX CREA: 1.0000 | TOPICAL_CREAM | Freq: Every day | CUTANEOUS | 2 refills | Status: AC

## 2023-05-11 NOTE — Patient Instructions (Addendum)
Please stop by lab before you go If you have mychart- we will send your results within 3 business days of [place] receiving them.  If you do not have mychart- we will call you about results within 5 business days of [place] receiving them.  *please also note that you will see labs on mychart as soon as they post. I will later go in and write notes on them- will say "notes from Dr. Durene Cal"   Update COVID shot at drug store  Recommended follow up: Return in about 6 months (around 11/08/2023) for followup or sooner if needed.Schedule b4 you leave.

## 2023-05-11 NOTE — Progress Notes (Signed)
Phone [phone] In person visit   Subjective:   Jonathon [name] is a 82 y.o. year old very pleasant male patient who presents for/with See problem oriented charting Chief Complaint  Patient presents with   Medical Management of Chronic Issues   Hyperlipidemia   Past Medical History-  Patient Active Problem List   Diagnosis Date Noted   DDD (degenerative disc disease), lumbar     Priority: High   GAD (generalized anxiety disorder)     Priority: High   Cigarette smoker     Priority: High   Emphysema lung (HCC) 12/03/2020    Priority: Medium    OSA on CPAP 12/03/2017    Priority: Medium    Hyperlipidemia 01/28/2017    Priority: Medium    Pulmonary nodules 12/26/2016    Priority: Medium    Aortic atherosclerosis (HCC) 10/28/2016    Priority: Medium    Neuropathy     Priority: Medium    Genetic testing 10/14/2016    Priority: Low   Family history of colon cancer 10/02/2016    Priority: Low   Family history of breast cancer     Priority: Low   Hilar lymphadenopathy 09/23/2016    Priority: Low   Lymph nodes enlarged 09/23/2016    Priority: Low   Family history of genetic disease 08/28/2016    Priority: Low   Macular degeneration 07/21/2016    Priority: Low   Lumbar radiculopathy, chronic 09/13/2020    Priority: 1.   Degenerative lumbar spinal stenosis 04/06/2019    Priority: 1.   Jock itch 07/31/2017    Medications- reviewed and updated Current Outpatient Medications  Medication Sig Dispense Refill   aspirin EC 81 MG tablet Take 81 mg by mouth daily.     atorvastatin (LIPITOR) 20 MG tablet TAKE 1 TABLET BY MOUTH EVERY DAY 90 tablet 3   cholecalciferol (VITAMIN D3) 25 MCG (1000 UNIT) tablet Take 1,000 Units by mouth daily.     escitalopram (LEXAPRO) 5 MG tablet TAKE 1 TABLET (5 MG TOTAL) BY MOUTH DAILY. 90 tablet 3   gabapentin (NEURONTIN) 400 MG capsule TAKE 1 CAPSULE MIDDAY AND 2 CAPSULES AT BEDTIME. 270 capsule 5   HYDROcodone-acetaminophen (NORCO) 5-325  MG tablet Take 1 tablet by mouth every 6 (six) hours as needed for severe pain (for chronic osteoarthritis of lumbar spine. do not drive for 8 hours after taking). 120 tablet 0   ketoconazole (NIZORAL) 2 % cream Apply 1 Application topically daily. For jock itch 60 g 2   Multiple Vitamins-Minerals (PRESERVISION AREDS 2 PO) Take 2 tablets by mouth daily.     No current facility-administered medications for this visit.     Objective:  BP 124/70   Pulse 70   Temp 97.9 F (36.6 C)   Ht 5\' 9"  (1.753 m)   Wt 197 lb 6.4 oz (89.5 kg)   SpO2 96%   BMI 29.15 kg/m  Gen: NAD, resting comfortably CV: RRR no murmurs rubs or gallops Lungs: CTAB no crackles, wheeze, rhonchi Ext: trace edema Skin: warm, dry     Assessment and Plan   # GAD S: compliant with lexapro 5 mg, gabapentin for neuropathy also likely beneficial. Previously had dry mouth on zoloft 150mg  and cut to 50mg  and still had issues.  -prior  alprazolam #37 per month as well but stopped with hydrocodone -wife has reported some more irritability caring for his wife- she has concerns about her medical health and allergies- hard to navigate A/P: generalized anxiety  disorder generally well controlled except for acute stressors- he asked about restart alprazolam but with hydrocodone advised against. Offered buspirone but he wants to hold off   #[facility]- [facility] - encouraged to follow through  # Smoking S:smoking 8-10 cigs per day in past now down under 5. Over 50 pack years.   A/P:  check urine today if he is able   # Hyperlipidemia/aortic atherosclerosis #CAD on CT scan S:compliant with atorvastatin 20mg . With smoking history we have opted to continue aspirin as well. Alternatively Lexapro increases bleeding risk- if any future bleeding issues would d/c aspirin Aortic atherosclerosis on prior imaging as well- reasonable for statin Lab Results  Component Value Date   CHOL 125 05/21/2022   HDL 37.50 (L)  05/21/2022   LDLCALC 53 05/21/2022   LDLDIRECT 49 04/05/2020   TRIG 170.0 (H) 05/21/2022   CHOLHDL 3 05/21/2022  A/P: lipids have looked good- continue current medications - this is at goal for someone with even CAD on CT scan- continue aspirin as well Aortic atherosclerosis (presumed stable)- LDL goal ideally <70    # Neuropathy  S:continued issues with left foot related to back. Uses gabapentin 400 at lunch and 800 before bed- finds this helpful  A/P:  not as bad lately- continue to monitor . Reports after cyst removal with neurosurgery has been better   #-Degenerative disc disease lumbar spine S:Medication: Hydrocodone/acetaminophen 5-3 25 up to 4 times a day with flares -he has cut back on more rigorous activities and feels pain overall is better. Hydrocodone sometimes down to 1 every other day which is encouraging as long as doesn't tweak his back. Most recent refill 4 months ago and reports still has several left A/P: reports overall better lately- much more sparing hydrocodone as above -Controlled substance contract signed 01/07/2023  -PDMP reviewed- low risk  -UDS ordered today again- hoping he can urinate    #Neck pain - some soreness with popping at times (for years but more noticeable as sits around more).  Wears a neck brace and that helps. Wants to monitor for now and hold off on imaging unless worsens  #OSA- uses cpap regularly    #Proteinuria- previously with protein in UA-consider microalbumin to creatinine ratio- will do today   #jock itch- wants to have ketoconazole on hand if recurrence- refilled  Recommended follow up: Return in about 6 months (around 11/08/2023) for followup or sooner if needed.Schedule b4 you leave. Future Appointments  Date Time Provider Department Center  05/03/2024  8:00 AM [facility] ANNUAL WELLNESS VISIT 1 [facility]   Lab/Order associations:   ICD-10-CM   1. GAD (generalized anxiety disorder)  F41.1     2. Hyperlipidemia, unspecified  hyperlipidemia type  E78.5 Comprehensive metabolic panel    Lipid panel    CBC with Differential/Platelet    3. Cigarette smoker  F17.210 Urinalysis, Routine w reflex microscopic    4. Proteinuria, unspecified type  R80.9 Microalbumin / creatinine urine ratio    5. High risk medication use  Z79.899 DRUG MONITORING, PANEL 8 WITH CONFIRMATION, URINE      Meds ordered this encounter  Medications   ketoconazole (NIZORAL) 2 % cream    Sig: Apply 1 Application topically daily. For jock itch    Dispense:  60 g    Refill:  2    Return precautions advised.  [name], MD

## 2023-05-13 LAB — DRUG MONITORING, PANEL 8 WITH CONFIRMATION, URINE
6 Acetylmorphine: NEGATIVE ng/mL (ref <10)
Alcohol Metabolites: NEGATIVE ng/mL (ref <500)
Amphetamines: NEGATIVE ng/mL (ref <500)
Benzodiazepines: NEGATIVE ng/mL (ref <100)
Buprenorphine, Urine: NEGATIVE ng/mL (ref <5)
Cocaine Metabolite: NEGATIVE ng/mL (ref <150)
Codeine: NEGATIVE ng/mL (ref <50)
Creatinine: 139 mg/dL (ref >=20.0)
Hydrocodone: 247 ng/mL — ABNORMAL HIGH (ref <50)
Hydromorphone: 159 ng/mL — ABNORMAL HIGH (ref <50)
MDMA: NEGATIVE ng/mL (ref <500)
Marijuana Metabolite: NEGATIVE ng/mL (ref <20)
Morphine: NEGATIVE ng/mL (ref <50)
Norhydrocodone: 399 ng/mL — ABNORMAL HIGH (ref <50)
Opiates: POSITIVE ng/mL — AB (ref <100)
Oxidant: NEGATIVE ug/mL (ref <200)
Oxycodone: NEGATIVE ng/mL (ref <100)
pH: 5.4 (ref 4.5–9.0)

## 2023-05-13 LAB — DM TEMPLATE

## 2023-05-21 ENCOUNTER — Ambulatory Visit (INDEPENDENT_AMBULATORY_CARE_PROVIDER_SITE_OTHER): Payer: Medicare Other | Admitting: Internal Medicine

## 2023-05-21 ENCOUNTER — Encounter: Payer: Self-pay | Admitting: Internal Medicine

## 2023-05-21 VITALS — BP 120/80 | HR 98 | Temp 98.0°F | Ht 69.0 in | Wt 199.0 lb

## 2023-05-21 DIAGNOSIS — J432 Centrilobular emphysema: Secondary | ICD-10-CM | POA: Diagnosis not present

## 2023-05-21 MED ORDER — PREDNISONE 20 MG PO TABS: 40.0000 mg | ORAL_TABLET | Freq: Every day | ORAL | 0 refills | Status: AC

## 2023-05-21 NOTE — Progress Notes (Signed)
   Subjective:   Patient ID: Jonathon [name], male    DOB: Jun 26, 1940, 82 y.o.   MRN: [id number]  HPI The patient is an 82 YO man coming in for cough and congestion ongoing 2-3 weeks. Usually takes prednisone. Some SOB.   Review of Systems  Constitutional:  Positive for activity change.  HENT: Negative.    Eyes: Negative.   Respiratory:  Positive for cough and shortness of breath. Negative for chest tightness.   Cardiovascular:  Negative for chest pain, palpitations and leg swelling.  Gastrointestinal:  Negative for abdominal distention, abdominal pain, constipation, diarrhea, nausea and vomiting.  Musculoskeletal: Negative.   Skin: Negative.   Neurological: Negative.   Psychiatric/Behavioral: Negative.      Objective:  Physical Exam Constitutional:      Appearance: He is well-developed.  HENT:     Head: Normocephalic and atraumatic.  Cardiovascular:     Rate and Rhythm: Normal rate and regular rhythm.  Pulmonary:     Effort: Pulmonary effort is normal. No respiratory distress.     Breath sounds: Wheezing and rhonchi present. No rales.     Comments: More right lung than left Abdominal:     General: Bowel sounds are normal. There is no distension.     Palpations: Abdomen is soft.     Tenderness: There is no abdominal tenderness. There is no rebound.  Musculoskeletal:     Cervical back: Normal range of motion.  Skin:    General: Skin is warm and dry.  Neurological:     Mental Status: He is alert and oriented to person, place, and time.     Coordination: Coordination normal.     Vitals:   05/21/23 1036  BP: 120/80  Pulse: 98  Temp: 98 F (36.7 C)  TempSrc: Oral  SpO2: 97%  Weight: 199 lb (90.3 kg)  Height: 5\' 9"  (1.753 m)    Assessment & Plan:

## 2023-05-21 NOTE — Patient Instructions (Signed)
We have sent in prednisone to take 2 pills daily for 6 days. If not improving call us and we will add an antibiotics.

## 2023-05-22 ENCOUNTER — Encounter: Payer: Self-pay | Admitting: Internal Medicine

## 2023-05-22 NOTE — Assessment & Plan Note (Signed)
With exacerbation today and rx prednisone. He does not really want antibiotics. Asked him to call back in 2-3 days if no improvement and we will add doxycycline 1 week course.

## 2023-05-29 ENCOUNTER — Encounter: Payer: Self-pay | Admitting: Genetic Counselor

## 2023-05-29 DIAGNOSIS — Z1589 Genetic susceptibility to other disease: Secondary | ICD-10-CM | POA: Insufficient documentation

## 2023-07-07 DIAGNOSIS — H35373 Puckering of macula, bilateral: Secondary | ICD-10-CM | POA: Diagnosis not present

## 2023-07-07 DIAGNOSIS — H25813 Combined forms of age-related cataract, bilateral: Secondary | ICD-10-CM | POA: Diagnosis not present

## 2023-07-07 DIAGNOSIS — H353132 Nonexudative age-related macular degeneration, bilateral, intermediate dry stage: Secondary | ICD-10-CM | POA: Diagnosis not present

## 2023-07-07 DIAGNOSIS — H40033 Anatomical narrow angle, bilateral: Secondary | ICD-10-CM | POA: Diagnosis not present

## 2023-07-09 ENCOUNTER — Other Ambulatory Visit: Payer: Self-pay | Admitting: Family Medicine

## 2023-07-14 ENCOUNTER — Telehealth: Payer: Self-pay | Admitting: Family Medicine

## 2023-07-14 NOTE — Telephone Encounter (Signed)
Copied from CRM [id number]. Topic: Clinical - Medication Question >> Jul 14, 2023  2:45 PM Alona Bene A wrote: Reason for CRM: Patient wants to know if there is a generic version of the gabapentin (NEURONTIN) 400 MG capsule. Please contact patient to advise if there is another brand available to reduce copay costs. Patient can be reached at [phone].

## 2023-07-14 NOTE — Telephone Encounter (Signed)
See message.

## 2023-07-14 NOTE — Telephone Encounter (Signed)
I did not send in namebrand-this may just not be covered well by his insurance-he could try GoodRx if desired

## 2023-07-20 ENCOUNTER — Other Ambulatory Visit (HOSPITAL_COMMUNITY): Payer: Self-pay

## 2023-07-21 ENCOUNTER — Telehealth (HOSPITAL_COMMUNITY): Payer: Self-pay

## 2023-07-21 NOTE — Telephone Encounter (Signed)
 Pharmacy Patient Advocate Encounter   Received notification from CoverMyMeds that prior authorization for lidocaine  % patches is required/requested.   Insurance verification completed.   The patient is insured through [employer]  .   Per test claim: PA required; PA started via CoverMyMeds. KEY [id number] . Waiting for clinical questions to populate.

## 2023-08-11 ENCOUNTER — Other Ambulatory Visit (HOSPITAL_COMMUNITY): Payer: Self-pay

## 2023-08-14 ENCOUNTER — Other Ambulatory Visit (HOSPITAL_COMMUNITY): Payer: Self-pay

## 2023-08-15 ENCOUNTER — Other Ambulatory Visit: Payer: Self-pay | Admitting: Family Medicine

## 2023-08-18 ENCOUNTER — Other Ambulatory Visit: Payer: Self-pay | Admitting: Family Medicine

## 2023-08-18 MED ORDER — HYDROCODONE-ACETAMINOPHEN 5-325 MG PO TABS: 1.0000 | ORAL_TABLET | Freq: Four times a day (QID) | ORAL | 0 refills | Status: DC | PRN

## 2023-08-18 NOTE — Telephone Encounter (Signed)
 Last Fill: 01/07/23  Last OV: 05/21/23 ACUTE Next OV: 11/10/23  Routing to provider for review/authorization.

## 2023-08-18 NOTE — Telephone Encounter (Signed)
 Copied from CRM [id number]. Topic: Clinical - Medication Refill >> Aug 18, 2023  9:23 AM Elizebeth Brooking wrote: Most Recent Primary Care Visit:  Provider: Hillard Danker A  Department: [facility]  Visit Type: SAME DAY  Date: 05/21/2023  Medication: HYDROcodone-acetaminophen (NORCO) 5-325 MG tablet   Has the patient contacted their pharmacy? Yes (Agent: If no, request that the patient contact the pharmacy for the refill. If patient does not wish to contact the pharmacy document the reason why and proceed with request.) (Agent: If yes, when and what did the pharmacy advise?)  Is this the correct pharmacy for this prescription? Yes If no, delete pharmacy and type the correct one.  This is the patient's preferred pharmacy:  [id number] - [place], [place] - [address] [place] [address] Phone: [phone] Fax: [phone]   Has the prescription been filled recently? No  Is the patient out of the medication? Yes  Has the patient been seen for an appointment in the last year OR does the patient have an upcoming appointment? Yes  Can we respond through MyChart? No  Agent: Please be advised that Rx refills may take up to 3 business days. We ask that you follow-up with your pharmacy.

## 2023-08-18 NOTE — Telephone Encounter (Signed)
 Requested Prescriptions   Pending Prescriptions Disp Refills   HYDROcodone-acetaminophen (NORCO) 5-325 MG tablet 120 tablet 0    Sig: Take 1 tablet by mouth every 6 (six) hours as needed for severe pain (pain score 7-10) (for chronic osteoarthritis of lumbar spine. do not drive for 8 hours after taking).     Date of patient request: 08/18/2023 Last office visit: 05/11/2023 Upcoming visit: 11/10/2023 Date of last refill: 01/07/2023 Last refill amount: 120

## 2023-09-18 ENCOUNTER — Other Ambulatory Visit (HOSPITAL_COMMUNITY): Payer: Self-pay

## 2023-09-29 ENCOUNTER — Other Ambulatory Visit: Payer: Self-pay

## 2023-09-29 ENCOUNTER — Other Ambulatory Visit: Payer: Self-pay | Admitting: Family Medicine

## 2023-09-29 MED ORDER — HYDROCODONE-ACETAMINOPHEN 5-325 MG PO TABS: 1.0000 | ORAL_TABLET | Freq: Four times a day (QID) | ORAL | 0 refills | Status: DC | PRN

## 2023-09-29 NOTE — Telephone Encounter (Signed)
 Copied from CRM [id number]. Topic: Clinical - Medication Refill >> Sep 29, 2023 11:03 AM Clydene Darner H wrote: Most Recent Primary Care Visit:  Provider: Bambi Lever A  Department: [facility]  Visit Type: SAME DAY  Date: 05/21/2023  Medication: HYDROcodone  Has the patient contacted their pharmacy? No Patient stated he usually contacts the clinic directly, and they process the refill.   (Agent: If no, request that the patient contact the pharmacy for the refill. If patient does not wish to contact the pharmacy document the reason why and proceed with request.) (Agent: If yes, when and what did the pharmacy advise?)  Is this the correct pharmacy for this prescription? Yes If no, delete pharmacy and type the correct one.  This is the patient's preferred pharmacy:  [id number] - [place], [place] - [address] [place] [address] Phone: [phone] Fax: [phone]   Has the prescription been filled recently? No Last filled on 08/19/23  Is the patient out of the medication? No  1 tablet left  Has the patient been seen for an appointment in the last year OR does the patient have an upcoming appointment? Yes  Can we respond through MyChart? No Patient stated that he does not use the MyChart patient portal.  Agent: Please be advised that Rx refills may take up to 3 business days. We ask that you follow-up with your pharmacy.

## 2023-11-10 ENCOUNTER — Ambulatory Visit: Payer: Self-pay | Admitting: Family Medicine

## 2023-11-10 ENCOUNTER — Encounter: Payer: Self-pay | Admitting: Family Medicine

## 2023-11-10 ENCOUNTER — Ambulatory Visit (INDEPENDENT_AMBULATORY_CARE_PROVIDER_SITE_OTHER): Payer: Medicare Other | Admitting: Family Medicine

## 2023-11-10 VITALS — BP 130/70 | HR 78 | Temp 97.8°F | Ht 69.0 in | Wt 196.2 lb

## 2023-11-10 DIAGNOSIS — E785 Hyperlipidemia, unspecified: Secondary | ICD-10-CM | POA: Diagnosis not present

## 2023-11-10 DIAGNOSIS — J432 Centrilobular emphysema: Secondary | ICD-10-CM

## 2023-11-10 DIAGNOSIS — I700 Atherosclerosis of aorta: Secondary | ICD-10-CM | POA: Diagnosis not present

## 2023-11-10 DIAGNOSIS — F17210 Nicotine dependence, cigarettes, uncomplicated: Secondary | ICD-10-CM

## 2023-11-10 DIAGNOSIS — F411 Generalized anxiety disorder: Secondary | ICD-10-CM | POA: Diagnosis not present

## 2023-11-10 DIAGNOSIS — Z131 Encounter for screening for diabetes mellitus: Secondary | ICD-10-CM | POA: Diagnosis not present

## 2023-11-10 DIAGNOSIS — E663 Overweight: Secondary | ICD-10-CM | POA: Diagnosis not present

## 2023-11-10 DIAGNOSIS — M51362 Other intervertebral disc degeneration, lumbar region with discogenic back pain and lower extremity pain: Secondary | ICD-10-CM

## 2023-11-10 LAB — CBC WITH DIFFERENTIAL/PLATELET
Basophils Absolute: 0 10*3/uL (ref 0.0–0.1)
Basophils Relative: 1.1 % (ref 0.0–3.0)
Eosinophils Absolute: 0.4 10*3/uL (ref 0.0–0.7)
Eosinophils Relative: 12.1 % — ABNORMAL HIGH (ref 0.0–5.0)
HCT: 34.1 % — ABNORMAL LOW (ref 39.0–52.0)
Hemoglobin: 11.5 g/dL — ABNORMAL LOW (ref 13.0–17.0)
Lymphocytes Relative: 34.1 % (ref 12.0–46.0)
Lymphs Abs: 1.2 10*3/uL (ref 0.7–4.0)
MCHC: 33.7 g/dL (ref 30.0–36.0)
MCV: 94.4 fl (ref 78.0–100.0)
Monocytes Absolute: 0.4 10*3/uL (ref 0.1–1.0)
Monocytes Relative: 10.9 % (ref 3.0–12.0)
Neutro Abs: 1.5 10*3/uL (ref 1.4–7.7)
Neutrophils Relative %: 41.8 % — ABNORMAL LOW (ref 43.0–77.0)
Platelets: 208 10*3/uL (ref 150.0–400.0)
RBC: 3.61 Mil/uL — ABNORMAL LOW (ref 4.22–5.81)
RDW: 14.5 % (ref 11.5–15.5)
WBC: 3.5 10*3/uL — ABNORMAL LOW (ref 4.0–10.5)

## 2023-11-10 LAB — COMPREHENSIVE METABOLIC PANEL WITH GFR
ALT: 17 U/L (ref 0–53)
AST: 22 U/L (ref 0–37)
Albumin: 3.5 g/dL (ref 3.5–5.2)
Alkaline Phosphatase: 93 U/L (ref 39–117)
BUN: 13 mg/dL (ref 6–23)
CO2: 27 meq/L (ref 19–32)
Calcium: 8.9 mg/dL (ref 8.4–10.5)
Chloride: 106 meq/L (ref 96–112)
Creatinine, Ser: 0.87 mg/dL (ref 0.40–1.50)
GFR: 80.06 mL/min (ref >60.00)
Glucose, Bld: 88 mg/dL (ref 70–99)
Potassium: 3.7 meq/L (ref 3.5–5.1)
Sodium: 141 meq/L (ref 135–145)
Total Bilirubin: 0.4 mg/dL (ref 0.2–1.2)
Total Protein: 6.4 g/dL (ref 6.0–8.3)

## 2023-11-10 LAB — TSH: TSH: 0.94 u[IU]/mL (ref 0.35–5.50)

## 2023-11-10 LAB — HEMOGLOBIN A1C: Hgb A1c MFr Bld: 6.1 % (ref 4.6–6.5)

## 2023-11-10 MED ORDER — HYDROCODONE-ACETAMINOPHEN 5-325 MG PO TABS: 1.0000 | ORAL_TABLET | Freq: Four times a day (QID) | ORAL | 0 refills | Status: DC | PRN

## 2023-11-10 NOTE — Addendum Note (Signed)
 Addended by: Almira Jaeger on: 11/10/2023 08:52 AM   Modules accepted: Level of Service

## 2023-11-10 NOTE — Patient Instructions (Addendum)
 Please stop by lab before you go If you have mychart- we will send your results within 3 business days of us  receiving them.  If you do not have mychart- we will call you about results within 5 business days of us  receiving them.  *please also note that you will see labs on mychart as soon as they post. I will later go in and write notes on them- will say "notes from Dr. Arlene Ben"   Recommended follow up: Return in about 6 months (around 05/12/2024) for physical or sooner if needed.Schedule b4 you leave.

## 2023-11-10 NOTE — Progress Notes (Signed)
 Phone [phone] In person visit   Subjective:   Jonathon [name] is a 83 y.o. year old very pleasant male patient who presents for/with See problem oriented charting Chief Complaint  Patient presents with   Medical Management of Chronic Issues   Hyperlipidemia   Past Medical History-  Patient Active Problem List   Diagnosis Date Noted   DDD (degenerative disc disease), lumbar     Priority: High   GAD (generalized anxiety disorder)     Priority: High   Cigarette smoker     Priority: High   Emphysema lung (HCC) 12/03/2020    Priority: Medium    OSA on CPAP 12/03/2017    Priority: Medium    Hyperlipidemia 01/28/2017    Priority: Medium    Pulmonary nodules 12/26/2016    Priority: Medium    Aortic atherosclerosis (HCC) 10/28/2016    Priority: Medium    Neuropathy     Priority: Medium    Genetic testing 10/14/2016    Priority: Low   Family history of colon cancer 10/02/2016    Priority: Low   Family history of breast cancer     Priority: Low   Hilar lymphadenopathy 09/23/2016    Priority: Low   Lymph nodes enlarged 09/23/2016    Priority: Low   Family history of genetic disease 08/28/2016    Priority: Low   Macular degeneration 07/21/2016    Priority: Low   Lumbar radiculopathy, chronic 09/13/2020    Priority: 1.   Degenerative lumbar spinal stenosis 04/06/2019    Priority: 1.   PALB2 positive 05/29/2023   Jock itch 07/31/2017    Medications- reviewed and updated Current Outpatient Medications  Medication Sig Dispense Refill   aspirin EC 81 MG tablet Take 81 mg by mouth daily.     atorvastatin  (LIPITOR) 20 MG tablet TAKE 1 TABLET BY MOUTH EVERY DAY 90 tablet 3   cholecalciferol (VITAMIN D3) 25 MCG (1000 UNIT) tablet Take 1,000 Units by mouth daily.     escitalopram  (LEXAPRO ) 5 MG tablet TAKE 1 TABLET (5 MG TOTAL) BY MOUTH DAILY. 90 tablet 3   gabapentin  (NEURONTIN ) 400 MG capsule TAKE 1 CAPSULE MIDDAY AND 2 CAPSULES AT BEDTIME. 270 capsule 5   ketoconazole   (NIZORAL ) 2 % cream Apply 1 Application topically daily. For jock itch 60 g 2   Multiple Vitamins-Minerals (PRESERVISION AREDS 2 PO) Take 2 tablets by mouth daily.     HYDROcodone -acetaminophen  (NORCO) 5-325 MG tablet Take 1 tablet by mouth every 6 (six) hours as needed for severe pain (pain score 7-10) (for chronic osteoarthritis of lumbar spine. do not drive for 8 hours after taking). 60 tablet 0   No current facility-administered medications for this visit.     Objective:  BP 130/70   Pulse 78   Temp 97.8 F (36.6 C)   Ht 5\' 9"  (1.753 m)   Wt 196 lb 3.2 oz (89 kg)   SpO2 97%   BMI 28.97 kg/m  Gen: NAD, resting comfortably CV: RRR no murmurs rubs or gallops Lungs: CTAB no crackles, wheeze, rhonchi Ext: trace edema, 2+ DP pulses Skin: warm, dry     Assessment and Plan   # GAD S: compliant with lexapro  5 mg, gabapentin  for neuropathy also likely beneficial.  A/P: reasonable but imperfect control- continue current medications    # Emphysema # Smoking S:smoking 8-10 cigs per day in past getting down to 5 a day. Over 50 pack years.   -Denies shortness of breath  -Did have  emphysema flare 05/21/2023- no further issue  A/P:  emphysema noted- encouraged cessation- he declines   # Hyperlipidemia/aortic atherosclerosis #CAD on CT scan S:compliant with atorvastatin  20mg . With smoking history we have opted to continue aspirin as well. Alternatively Lexapro  increases bleeding risk- if any future bleeding issues would d/c aspirin Aortic atherosclerosis on prior imaging as well- reasonable for statin -no chest pain  Lab Results  Component Value Date   CHOL 99 05/11/2023   HDL 31.20 (L) 05/11/2023   LDLCALC 50 05/11/2023   LDLDIRECT 49 04/05/2020   TRIG 93.0 05/11/2023   CHOLHDL 3 05/11/2023  A/P: coronary artery disease asymptomatic  - continue current medications  Aortic atherosclerosis (presumed stable)- LDL goal ideally <70 - at goal- update lipids next visit  #  Neuropathy  S:continued issues with left foot related to back. Uses gabapentin  400 at lunch and 800 before bed- finds this helpful  - does have some cold sensation both feet but neuropathy on the left. Pulses good today A/P:  overall stable pain on gabapentin - continue to monitor    #Degenerative disc disease lumbar spine S:Medication: Hydrocodone /acetaminophen  5-3 25 up to 4 times a day with flares- reports out for several weeks and tolerating other than with flares is using once a day but can be higher if really flares as has in past - still dealing with pain on daily basis but tolerates to best of his his ability  A/P: reasonable control lately with about one a day- refill provided today  -Controlled substance contract signed 01/07/2023  -PDMP reviewed  -UDS ordered     #OSA- uses cpap regularly    #Prediabetes- hard to exercise with his back.   Recommended follow up: Return in about 6 months (around 05/12/2024) for physical or sooner if needed.Schedule b4 you leave. Future Appointments  Date Time Provider Department Center  05/03/2024  8:00 AM [facility] ANNUAL WELLNESS VISIT 1 [facility]    Lab/Order associations:   ICD-10-CM   1. GAD (generalized anxiety disorder)  F41.1     2. Hyperlipidemia, unspecified hyperlipidemia type  E78.5 Comprehensive metabolic panel with GFR    CBC with Differential/Platelet    TSH    3. Cigarette smoker  F17.210     4. Aortic atherosclerosis (HCC)  I70.0     5. Degeneration of intervertebral disc of lumbar region with discogenic back pain and lower extremity pain  M51.362     6. Screening for diabetes mellitus  Z13.1 Hemoglobin A1c    7. Overweight  E66.3 Hemoglobin A1c    8. Centrilobular emphysema (HCC) Chronic J43.2       Meds ordered this encounter  Medications   HYDROcodone -acetaminophen  (NORCO) 5-325 MG tablet    Sig: Take 1 tablet by mouth every 6 (six) hours as needed for severe pain (pain score 7-10) (for chronic  osteoarthritis of lumbar spine. do not drive for 8 hours after taking).    Dispense:  60 tablet    Refill:  0    Return precautions advised.  Clarisa Crooked, MD

## 2023-11-11 ENCOUNTER — Other Ambulatory Visit: Payer: Self-pay

## 2023-11-11 DIAGNOSIS — D649 Anemia, unspecified: Secondary | ICD-10-CM

## 2023-11-23 ENCOUNTER — Ambulatory Visit: Payer: Self-pay | Admitting: Family Medicine

## 2023-11-23 ENCOUNTER — Other Ambulatory Visit (INDEPENDENT_AMBULATORY_CARE_PROVIDER_SITE_OTHER)

## 2023-11-23 DIAGNOSIS — D649 Anemia, unspecified: Secondary | ICD-10-CM | POA: Diagnosis not present

## 2023-11-23 LAB — CBC WITH DIFFERENTIAL/PLATELET
Basophils Absolute: 0 10*3/uL (ref 0.0–0.1)
Basophils Relative: 0.6 % (ref 0.0–3.0)
Eosinophils Absolute: 0.5 10*3/uL (ref 0.0–0.7)
Eosinophils Relative: 13.9 % — ABNORMAL HIGH (ref 0.0–5.0)
HCT: 34.9 % — ABNORMAL LOW (ref 39.0–52.0)
Hemoglobin: 11.8 g/dL — ABNORMAL LOW (ref 13.0–17.0)
Lymphocytes Relative: 44.9 % (ref 12.0–46.0)
Lymphs Abs: 1.7 10*3/uL (ref 0.7–4.0)
MCHC: 33.7 g/dL (ref 30.0–36.0)
MCV: 95.6 fl (ref 78.0–100.0)
Monocytes Absolute: 0.3 10*3/uL (ref 0.1–1.0)
Monocytes Relative: 8.7 % (ref 3.0–12.0)
Neutro Abs: 1.2 10*3/uL — ABNORMAL LOW (ref 1.4–7.7)
Neutrophils Relative %: 31.9 % — ABNORMAL LOW (ref 43.0–77.0)
Platelets: 207 10*3/uL (ref 150.0–400.0)
RBC: 3.66 Mil/uL — ABNORMAL LOW (ref 4.22–5.81)
RDW: 14.7 % (ref 11.5–15.5)
WBC: 3.8 10*3/uL — ABNORMAL LOW (ref 4.0–10.5)

## 2023-11-24 LAB — IRON,TIBC AND FERRITIN PANEL
%SAT: 22 % (ref 20–48)
Ferritin: 99 ng/mL (ref 24–380)
Iron: 70 ug/dL (ref 50–180)
TIBC: 315 ug/dL (ref 250–425)

## 2023-11-25 ENCOUNTER — Other Ambulatory Visit: Payer: Self-pay

## 2023-11-25 DIAGNOSIS — D72819 Decreased white blood cell count, unspecified: Secondary | ICD-10-CM

## 2023-11-25 DIAGNOSIS — D649 Anemia, unspecified: Secondary | ICD-10-CM

## 2023-11-30 ENCOUNTER — Other Ambulatory Visit

## 2023-12-01 ENCOUNTER — Ambulatory Visit (INDEPENDENT_AMBULATORY_CARE_PROVIDER_SITE_OTHER)

## 2023-12-01 DIAGNOSIS — D649 Anemia, unspecified: Secondary | ICD-10-CM | POA: Diagnosis not present

## 2023-12-01 LAB — FECAL OCCULT BLOOD, IMMUNOCHEMICAL: Fecal Occult Bld: POSITIVE — AB

## 2023-12-07 ENCOUNTER — Other Ambulatory Visit: Payer: Self-pay

## 2023-12-07 DIAGNOSIS — R195 Other fecal abnormalities: Secondary | ICD-10-CM

## 2023-12-22 ENCOUNTER — Inpatient Hospital Stay

## 2023-12-22 ENCOUNTER — Inpatient Hospital Stay: Attending: Oncology | Admitting: Oncology

## 2023-12-22 VITALS — BP 124/71 | HR 84 | Temp 98.1°F | Resp 18 | Ht 69.0 in | Wt 193.9 lb

## 2023-12-22 DIAGNOSIS — Z79899 Other long term (current) drug therapy: Secondary | ICD-10-CM | POA: Insufficient documentation

## 2023-12-22 DIAGNOSIS — R195 Other fecal abnormalities: Secondary | ICD-10-CM

## 2023-12-22 DIAGNOSIS — E538 Deficiency of other specified B group vitamins: Secondary | ICD-10-CM | POA: Insufficient documentation

## 2023-12-22 DIAGNOSIS — D649 Anemia, unspecified: Secondary | ICD-10-CM | POA: Diagnosis not present

## 2023-12-22 DIAGNOSIS — D72819 Decreased white blood cell count, unspecified: Secondary | ICD-10-CM

## 2023-12-22 LAB — CBC WITH DIFFERENTIAL (CANCER CENTER ONLY)
Abs Immature Granulocytes: 0.01 K/uL (ref 0.00–0.07)
Basophils Absolute: 0 K/uL (ref 0.0–0.1)
Basophils Relative: 1 %
Eosinophils Absolute: 0.3 K/uL (ref 0.0–0.5)
Eosinophils Relative: 8 %
HCT: 39.3 % (ref 39.0–52.0)
Hemoglobin: 12.8 g/dL — ABNORMAL LOW (ref 13.0–17.0)
Immature Granulocytes: 0 %
Lymphocytes Relative: 28 %
Lymphs Abs: 1.2 K/uL (ref 0.7–4.0)
MCH: 31.8 pg (ref 26.0–34.0)
MCHC: 32.6 g/dL (ref 30.0–36.0)
MCV: 97.8 fL (ref 80.0–100.0)
Monocytes Absolute: 0.4 K/uL (ref 0.1–1.0)
Monocytes Relative: 9 %
Neutro Abs: 2.4 K/uL (ref 1.7–7.7)
Neutrophils Relative %: 54 %
Platelet Count: 198 K/uL (ref 150–400)
RBC: 4.02 MIL/uL — ABNORMAL LOW (ref 4.22–5.81)
RDW: 14.2 % (ref 11.5–15.5)
WBC Count: 4.4 K/uL (ref 4.0–10.5)
nRBC: 0 % (ref 0.0–0.2)

## 2023-12-22 LAB — DIRECT ANTIGLOBULIN TEST (NOT AT ARMC)
DAT, IgG: NEGATIVE
DAT, complement: NEGATIVE

## 2023-12-22 LAB — IRON AND TIBC
Iron: 74 ug/dL (ref 45–182)
Saturation Ratios: 19 % (ref 17.9–39.5)
TIBC: 391 ug/dL (ref 250–450)
UIBC: 317 ug/dL

## 2023-12-22 LAB — CMP (CANCER CENTER ONLY)
ALT: 15 U/L (ref 0–44)
AST: 22 U/L (ref 15–41)
Albumin: 4 g/dL (ref 3.5–5.0)
Alkaline Phosphatase: 129 U/L — ABNORMAL HIGH (ref 38–126)
Anion gap: 10 (ref 5–15)
BUN: 13 mg/dL (ref 8–23)
CO2: 26 mmol/L (ref 22–32)
Calcium: 9.5 mg/dL (ref 8.9–10.3)
Chloride: 103 mmol/L (ref 98–111)
Creatinine: 1.05 mg/dL (ref 0.61–1.24)
GFR, Estimated: >60 mL/min (ref >60)
Glucose, Bld: 77 mg/dL (ref 70–99)
Potassium: 4 mmol/L (ref 3.5–5.1)
Sodium: 139 mmol/L (ref 135–145)
Total Bilirubin: 0.4 mg/dL (ref 0.0–1.2)
Total Protein: 7.2 g/dL (ref 6.5–8.1)

## 2023-12-22 LAB — LACTATE DEHYDROGENASE: LDH: 153 U/L (ref 98–192)

## 2023-12-22 LAB — FERRITIN: Ferritin: 137 ng/mL (ref 24–336)

## 2023-12-22 LAB — VITAMIN B12: Vitamin B-12: 140 pg/mL — ABNORMAL LOW (ref 180–914)

## 2023-12-22 LAB — FOLATE: Folate: 6.4 ng/mL (ref >5.9)

## 2023-12-22 LAB — TSH: TSH: 1.79 u[IU]/mL (ref 0.350–4.500)

## 2023-12-22 NOTE — Progress Notes (Signed)
 [facility]  HEMATOLOGY CLINIC CONSULTATION NOTE   PATIENT NAME: Jonathon [name]   MR#: [id number] DOB: [birth year]  DATE OF SERVICE: 12/22/2023   REFERRING PROVIDER  Katrinka Garnette KIDD, MD   Patient Care Team: Katrinka Garnette KIDD, MD as PCP - General (Family Medicine) Jude Harden GAILS, MD as Consulting Physician (Pulmonary Disease) Cleatus Collar, MD as Consulting Physician (Ophthalmology) Kristie Lamprey, MD as Consulting Physician (Gastroenterology) Clinic, Bonni Lien as Consulting Physician   REASON FOR CONSULTATION/ CHIEF COMPLAINT:  Leukopenia and anemia  ASSESSMENT & PLAN:  Jonathon [name] is a 83 y.o. gentleman with a past medical history of dyslipidemia, macular degeneration, degenerative joint disease, anxiety, was referred to our service for evaluation of chronic leukopenia and mild anemia.    Leukopenia Chronic leukopenia with white blood cell count consistently slightly below normal range (3,800 cells/mcL) since at least April 2021. No signs of infection such as fevers, chills, or night sweats. Differential diagnosis includes medication effects, viral infection, vitamin deficiencies (B12, folic acid , iron), or bone marrow insufficiency. Bone marrow function may be slowing due to age.  Labs today actually showed normal white count of 4400 with normal differential.  Hemoglobin also better at 12.8, MCV 98.  Platelet count 198,000.  Iron studies normal.  B12, folate, methylmalonic acid pending.  - Order blood tests to evaluate for vitamin deficiencies and autoimmune conditions  - Consider bone marrow biopsy if leukopenia worsens or persists.  Currently no indication for the same.  Normocytic anemia Mild anemia with hemoglobin levels slightly below normal (11.8 g/dL) for men. No evidence of acute blood loss or gastrointestinal bleeding. Mild anemia with previous hemoglobin levels of 11.5 g/dL in May and 87.0 g/dL in October and November of the previous  year. Positive fecal occult blood test indicating possible gastrointestinal bleeding. No recent colonoscopy, but history of multiple colonoscopies in the past. No symptoms of gastrointestinal distress such as heartburn or acid reflux. No recent unintentional weight loss or changes in appetite. - Order blood tests to evaluate for potential causes of anemia, including vitamin deficiencies  Occult blood positive stool Positive fecal occult blood test indicating possible gastrointestinal bleeding. No recent colonoscopy, but history of multiple colonoscopies in the past. No symptoms of gastrointestinal distress such as heartburn or acid reflux. No recent unintentional weight loss or changes in appetite.   I reviewed lab results and outside records for this visit and discussed relevant results with the patient. Diagnosis, plan of care and treatment options were also discussed in detail with the patient. Opportunity provided to ask questions and answers provided to his apparent satisfaction. Provided instructions to call our clinic with any problems, questions or concerns prior to return visit. I recommended to continue follow-up with PCP and sub-specialists. He verbalized understanding and agreed with the plan. No barriers to learning was detected.  Jaquana Geiger, MD    [facility] - A DEPT OF [facility] [address] [place] [address] Dept: [phone] Dept Fax: [phone]   HISTORY OF PRESENT ILLNESS:   Discussed the use of AI scribe software for clinical note transcription with the patient, who gave verbal consent to proceed.  History of Present Illness Jonathon [name] is an 84 year old male who presents for hematology evaluation of low white blood cell count. He was referred by his primary doctor for evaluation of low white blood cell count.  He has a history of low white  blood cell count, fluctuating since at  least April 2021, with recent measurements around 3,800, below the normal range of 4,000 to 11,000. No symptoms of infection such as fevers, chills, or night sweats are present.  He also has mild anemia, with a hemoglobin level of 11.8, below the normal range for men, which is 13. This has been a recurring issue, with previous levels recorded at 11.5 in May and 12.9 in October and November of the previous year. No known blood loss, including no black or bloody stools, although he does not regularly check his stools. A stool test revealed occult blood. He recalls a similar finding years ago and has undergone multiple colonoscopies in the past, though not recently. No symptoms of heartburn or acid reflux are reported.  He reports a lack of appetite, particularly in the mornings, and does not eat breakfast, a pattern he notes is common among his peers. Despite this, he denies any unintentional weight loss. He consumes soft drinks regularly, which his caregiver suggests might affect his appetite.  His current medications include Adriene's and vitamin D3, which he takes regularly. No recurrent infections, heartburn, or acid reflux are reported. He experiences cold feet at night but attributes it to inadequate covering.   He denies fever, cough, diarrhea, or other infectious symptoms.  He denies epistaxis, bloody stool, melena, hematuria, bruising or other bleeding symptoms. He also denies unintentional weight loss, night sweats or other constitutional symptoms.  MEDICAL HISTORY Past Medical History:  Diagnosis Date   Anxiety    never tried on SSRI. xanax  0.5mg  BID through Dr. Linnie.    DDD (degenerative disc disease), lumbar    lumbar DDD- follows with orthopedist Dr. Lucilla schultz ortho. mobic  15mg  daily and hydrocodone  5/325 prn   Family history of breast cancer    HLD (hyperlipidemia)    Macular degeneration 07/21/2016   Follows with optho- areds2. He states he is unsure of diagnosis.     Neuropathy    unknown cause ? back related(sees ortho for back). burning sensation in feet. gabapentin  100mg  after dinner, 2 before bed   Smoker    8-10 cigarettes a day. plus nicorette. 60 years. over 50 pack years   Venous stasis     in the past- Wound center on legs. Elevate. Compression stockings.      SURGICAL HISTORY Past Surgical History:  Procedure Laterality Date   ESOPHAGOGASTRODUODENOSCOPY Left 05/28/2016    Surgeon: Renaye Sous, MD; food stuck in esophagus   LUMBAR LAMINECTOMY/DECOMPRESSION MICRODISCECTOMY Left 09/13/2020   Procedure: Left Lumbar four-five Laminectomy for facet/synovial cyst;  Surgeon: Colon Shove, MD;  Location: [facility] OR;  Service: Neurosurgery;  Laterality: Left;     SOCIAL HISTORY: He reports that he has quit smoking. His smoking use included cigarettes. He started smoking about 67 years ago. He has a 16.9 pack-year smoking history. He has never used smokeless tobacco. He reports that he does not currently use alcohol. He reports that he does not use drugs. Social History   Socioeconomic History   Marital status: Married    Spouse name: Not on file   Number of children: Not on file   Years of education: Not on file   Highest education level: Not on file  Occupational History   Occupation: semi retired  Tobacco Use   Smoking status: Former    Current packs/day: 0.25    Average packs/day: 0.3 packs/day for 67.5 years (16.9 ttl pk-yrs)    Types: Cigarettes    Start date: 56  Smokeless tobacco: Never   Tobacco comments:    02/10/19- .5 pack   Vaping Use   Vaping status: Never Used  Substance and Sexual Activity   Alcohol use: Not Currently    Comment: rare drinking occasionally   Drug use: No   Sexual activity: Not on file  Other Topics Concern   Not on file  Social History Narrative   Married around 1964. 3 children. 6 grandkids.       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, molecular  products on the side   Social Drivers of Corporate investment banker Strain: Low Risk  (04/27/2023)   Overall Financial Resource Strain (CARDIA)    Difficulty of Paying Living Expenses: Not hard at all  Food Insecurity: No Food Insecurity (12/22/2023)   Hunger Vital Sign    Worried About Running Out of Food in the Last Year: Never true    Ran Out of Food in the Last Year: Never true  Transportation Needs: No Transportation Needs (12/22/2023)   PRAPARE - Administrator, Civil Service (Medical): No    Lack of Transportation (Non-Medical): No  Physical Activity: Inactive (04/27/2023)   Exercise Vital Sign    Days of Exercise per Week: 0 days    Minutes of Exercise per Session: 0 min  Stress: No Stress Concern Present (04/27/2023)   [employer] of Occupational Health - Occupational Stress Questionnaire    Feeling of Stress : Not at all  Social Connections: Moderately Isolated (04/27/2023)   Social Connection and Isolation Panel    Frequency of Communication with Friends and Family: Once a week    Frequency of Social Gatherings with Friends and Family: More than three times a week    Attends Religious Services: Never    Database administrator or Organizations: No    Attends Banker Meetings: Never    Marital Status: Married  Catering manager Violence: Not At Risk (12/22/2023)   Humiliation, Afraid, Rape, and Kick questionnaire    Fear of Current or Ex-Partner: No    Emotionally Abused: No    Physically Abused: No    Sexually Abused: No    FAMILY HISTORY: His family history includes Alcohol abuse in his brother and father; Breast cancer in his maternal aunt and sister; Breast cancer (age of onset: 66) in an other family member; Cancer in his sister; Colon cancer in his maternal uncle; Dementia in his mother; Other in his mother and sister.  CURRENT MEDICATIONS   Current Outpatient Medications  Medication Instructions   aspirin EC 81 mg, Daily    Aspirin-Caffeine 845-65 MG PACK 1 packet, Daily PRN   atorvastatin  (LIPITOR) 20 mg, Oral, Daily   cholecalciferol (VITAMIN D3) 1,000 Units, Daily   docusate sodium  (COLACE) 50 mg, Daily PRN   escitalopram  (LEXAPRO ) 5 mg, Oral, Daily   gabapentin  (NEURONTIN ) 400 MG capsule TAKE 1 CAPSULE MIDDAY AND 2 CAPSULES AT BEDTIME.   HYDROcodone -acetaminophen  (NORCO) 5-325 MG tablet 1 tablet, Oral, Every 6 hours PRN   ketoconazole  (NIZORAL ) 2 % cream 1 Application, Topical, Daily, For jock itch   Multiple Vitamins-Minerals (PRESERVISION AREDS 2 PO) 2 tablets, Daily   polyethylene glycol powder (GLYCOLAX /MIRALAX ) 17 g, Daily PRN     ALLERGIES  He is allergic to penicillins.  REVIEW OF SYSTEMS:  Review of Systems - Oncology   Rest of the pertinent review of systems is unremarkable except as mentioned above in HPI.  PHYSICAL EXAMINATION:  Vitals:   12/22/23 1004  BP: 124/71  Pulse: 84  Resp: 18  Temp: 98.1 F (36.7 C)  SpO2: 100%   Filed Weights   12/22/23 1004  Weight: 193 lb 14.4 oz (88 kg)    Physical Exam Constitutional:      General: He is not in acute distress.    Appearance: Normal appearance.  HENT:     Head: Normocephalic and atraumatic.  Eyes:     Conjunctiva/sclera: Conjunctivae normal.  Cardiovascular:     Rate and Rhythm: Normal rate and regular rhythm.  Pulmonary:     Effort: Pulmonary effort is normal. No respiratory distress.  Abdominal:     General: There is no distension.  Neurological:     General: No focal deficit present.     Mental Status: He is alert and oriented to person, place, and time.  Psychiatric:        Mood and Affect: Mood normal.        Behavior: Behavior normal.      LABORATORY DATA:   I have reviewed the data as listed.  Results for orders placed or performed in visit on 12/22/23  Haptoglobin  Result Value Ref Range   Haptoglobin 241 38 - 329 mg/dL  Lactate dehydrogenase  Result Value Ref Range   LDH 153 98 - 192 U/L   TSH  Result Value Ref Range   TSH 1.790 0.350 - 4.500 uIU/mL  Rheumatoid factor  Result Value Ref Range   Rheumatoid fact SerPl-aCnc <10.0 <14.0 IU/mL  Flow Cytometry, Peripheral Blood (Oncology)  Result Value Ref Range   Flow Cytometry TEST CANCELLED PER MD   ANA w/Reflex if Positive  Result Value Ref Range   Anti Nuclear Antibody (ANA) Negative Negative  Methylmalonic acid, serum  Result Value Ref Range   Methylmalonic Acid, Quantitative 636 (H) 0 - 378 nmol/L  Folate  Result Value Ref Range   Folate 6.4 >5.9 ng/mL  Vitamin B12  Result Value Ref Range   Vitamin B-12 140 (L) 180 - 914 pg/mL  Ferritin  Result Value Ref Range   Ferritin 137 24 - 336 ng/mL  Iron and TIBC  Result Value Ref Range   Iron 74 45 - 182 ug/dL   TIBC 608 749 - 549 ug/dL   Saturation Ratios 19 17.9 - 39.5 %   UIBC 317 ug/dL  CMP (Cancer Center only)  Result Value Ref Range   Sodium 139 135 - 145 mmol/L   Potassium 4.0 3.5 - 5.1 mmol/L   Chloride 103 98 - 111 mmol/L   CO2 26 22 - 32 mmol/L   Glucose, Bld 77 70 - 99 mg/dL   BUN 13 8 - 23 mg/dL   Creatinine 8.94 9.38 - 1.24 mg/dL   Calcium  9.5 8.9 - 10.3 mg/dL   Total Protein 7.2 6.5 - 8.1 g/dL   Albumin 4.0 3.5 - 5.0 g/dL   AST 22 15 - 41 U/L   ALT 15 0 - 44 U/L   Alkaline Phosphatase 129 (H) 38 - 126 U/L   Total Bilirubin 0.4 0.0 - 1.2 mg/dL   GFR, Estimated >39 >39 mL/min   Anion gap 10 5 - 15  CBC with Differential (Cancer Center Only)  Result Value Ref Range   WBC Count 4.4 4.0 - 10.5 K/uL   RBC 4.02 (L) 4.22 - 5.81 MIL/uL   Hemoglobin 12.8 (L) 13.0 - 17.0 g/dL   HCT 60.6 60.9 - 47.9 %   MCV 97.8 80.0 -  100.0 fL   MCH 31.8 26.0 - 34.0 pg   MCHC 32.6 30.0 - 36.0 g/dL   RDW 85.7 88.4 - 84.4 %   Platelet Count 198 150 - 400 K/uL   nRBC 0.0 0.0 - 0.2 %   Neutrophils Relative % 54 %   Neutro Abs 2.4 1.7 - 7.7 K/uL   Lymphocytes Relative 28 %   Lymphs Abs 1.2 0.7 - 4.0 K/uL   Monocytes Relative 9 %   Monocytes Absolute 0.4 0.1 -  1.0 K/uL   Eosinophils Relative 8 %   Eosinophils Absolute 0.3 0.0 - 0.5 K/uL   Basophils Relative 1 %   Basophils Absolute 0.0 0.0 - 0.1 K/uL   Immature Granulocytes 0 %   Abs Immature Granulocytes 0.01 0.00 - 0.07 K/uL  Direct antiglobulin test (not at [facility])  Result Value Ref Range   DAT, complement NEG    DAT, IgG      NEG Performed at [facility], [address], [place], [place] [address]      RADIOGRAPHIC STUDIES:  No pertinent imaging studies available to review.  Orders Placed This Encounter  Procedures   CBC with Differential (Cancer Center Only)    Standing Status:   Future    Number of Occurrences:   1    Expiration Date:   12/21/2024   CMP (Cancer Center only)    Standing Status:   Future    Number of Occurrences:   1    Expiration Date:   12/21/2024   Iron and TIBC    Standing Status:   Future    Number of Occurrences:   1    Expiration Date:   12/21/2024   Ferritin    Standing Status:   Future    Number of Occurrences:   1    Expiration Date:   12/21/2024   Vitamin B12    Standing Status:   Future    Number of Occurrences:   1    Expiration Date:   12/21/2024   Folate    Standing Status:   Future    Number of Occurrences:   1    Expiration Date:   12/21/2024   Methylmalonic acid, serum    Standing Status:   Future    Number of Occurrences:   1    Expiration Date:   12/21/2024   ANA w/Reflex if Positive    Standing Status:   Future    Number of Occurrences:   1    Expiration Date:   12/21/2024   Flow Cytometry, Peripheral Blood (Oncology)    Standing Status:   Future    Number of Occurrences:   1    Expiration Date:   12/21/2024   Rheumatoid factor    Standing Status:   Future    Number of Occurrences:   1    Expiration Date:   12/21/2024   TSH    Standing Status:   Future    Number of Occurrences:   1    Expiration Date:   12/21/2024   Lactate dehydrogenase    Standing Status:   Future    Number of Occurrences:   1    Expiration Date:    12/21/2024   Haptoglobin    Standing Status:   Future    Number of Occurrences:   1    Expiration Date:   12/21/2024   Direct antiglobulin test (not at [facility])    Standing Status:  Future    Number of Occurrences:   1    Expiration Date:   12/21/2024    Future Appointments  Date Time Provider Department Center  12/29/2023  3:20 PM Alanmichael Barmore, Chinita, MD [facility] None  03/22/2024  8:00 AM [facility] PHLEBOTOMIST [facility] None  03/22/2024  8:30 AM Keily Lepp, Chinita, MD [facility] None  05/03/2024  8:00 AM [facility] ANNUAL WELLNESS VISIT 1 [facility]  06/02/2024  9:00 AM Katrinka Garnette KIDD, MD [facility]    I spent a total of 40 minutes during this encounter with the patient including review of chart and various tests results, discussions about plan of care and coordination of care plan.  This document was completed utilizing speech recognition software. Grammatical errors, random word insertions, pronoun errors, and incomplete sentences are an occasional consequence of this system due to software limitations, ambient noise, and hardware issues. Any formal questions or concerns about the content, text or information contained within the body of this dictation should be directly addressed to the provider for clarification.

## 2023-12-23 LAB — ANA W/REFLEX IF POSITIVE: Anti Nuclear Antibody (ANA): NEGATIVE

## 2023-12-23 LAB — FLOW CYTOMETRY

## 2023-12-24 LAB — RHEUMATOID FACTOR: Rheumatoid fact SerPl-aCnc: <10 [IU]/mL (ref <14.0)

## 2023-12-24 LAB — HAPTOGLOBIN: Haptoglobin: 241 mg/dL (ref 38–329)

## 2023-12-25 LAB — METHYLMALONIC ACID, SERUM: Methylmalonic Acid, Quantitative: 636 nmol/L — ABNORMAL HIGH (ref 0–378)

## 2023-12-28 ENCOUNTER — Encounter: Payer: Self-pay | Admitting: Oncology

## 2023-12-28 DIAGNOSIS — D72819 Decreased white blood cell count, unspecified: Secondary | ICD-10-CM | POA: Insufficient documentation

## 2023-12-28 DIAGNOSIS — D649 Anemia, unspecified: Secondary | ICD-10-CM | POA: Insufficient documentation

## 2023-12-28 DIAGNOSIS — R195 Other fecal abnormalities: Secondary | ICD-10-CM | POA: Insufficient documentation

## 2023-12-28 NOTE — Assessment & Plan Note (Signed)
 Positive fecal occult blood test indicating possible gastrointestinal bleeding. No recent colonoscopy, but history of multiple colonoscopies in the past. No symptoms of gastrointestinal distress such as heartburn or acid reflux. No recent unintentional weight loss or changes in appetite.

## 2023-12-28 NOTE — Assessment & Plan Note (Signed)
 Mild anemia with hemoglobin levels slightly below normal (11.8 g/dL) for men. No evidence of acute blood loss or gastrointestinal bleeding. Mild anemia with previous hemoglobin levels of 11.5 g/dL in May and 87.0 g/dL in October and November of the previous year. Positive fecal occult blood test indicating possible gastrointestinal bleeding. No recent colonoscopy, but history of multiple colonoscopies in the past. No symptoms of gastrointestinal distress such as heartburn or acid reflux. No recent unintentional weight loss or changes in appetite. - Order blood tests to evaluate for potential causes of anemia, including vitamin deficiencies

## 2023-12-28 NOTE — Assessment & Plan Note (Addendum)
 Chronic leukopenia with white blood cell count consistently slightly below normal range (3,800 cells/mcL) since at least April 2021. No signs of infection such as fevers, chills, or night sweats. Differential diagnosis includes medication effects, viral infection, vitamin deficiencies (B12, folic acid , iron), or bone marrow insufficiency. Bone marrow function may be slowing due to age.  Labs today actually showed normal white count of 4400 with normal differential.  Hemoglobin also better at 12.8, MCV 98.  Platelet count 198,000.  Iron studies normal.  B12, folate, methylmalonic acid pending.  - Order blood tests to evaluate for vitamin deficiencies and autoimmune conditions  - Consider bone marrow biopsy if leukopenia worsens or persists.  Currently no indication for the same.

## 2023-12-29 ENCOUNTER — Encounter: Payer: Self-pay | Admitting: Oncology

## 2023-12-29 ENCOUNTER — Inpatient Hospital Stay: Admitting: Oncology

## 2023-12-29 DIAGNOSIS — D72819 Decreased white blood cell count, unspecified: Secondary | ICD-10-CM | POA: Diagnosis not present

## 2023-12-29 DIAGNOSIS — D649 Anemia, unspecified: Secondary | ICD-10-CM

## 2023-12-29 DIAGNOSIS — E538 Deficiency of other specified B group vitamins: Secondary | ICD-10-CM

## 2023-12-29 MED ORDER — VITAMIN B-12 1000 MCG PO TABS: 1000.0000 ug | ORAL_TABLET | Freq: Every day | ORAL | 3 refills | Status: AC

## 2023-12-29 NOTE — Progress Notes (Signed)
 [facility]  HEMATOLOGY-ONCOLOGY ELECTRONIC VISIT PROGRESS NOTE  PATIENT NAME: Jonathon [name]   MR#: [id number] DOB: [birth year]  DATE OF SERVICE: 12/29/2023  Patient Care Team: Katrinka Garnette KIDD, MD as PCP - General (Family Medicine) Jude Harden GAILS, MD as Consulting Physician (Pulmonary Disease) Cleatus Collar, MD as Consulting Physician (Ophthalmology) Kristie Lamprey, MD as Consulting Physician (Gastroenterology) Clinic, Bonni Lien as Consulting Physician  I connected with the patient via telephone conference and verified that I am speaking with the correct person using two identifiers. The patient's location is at home and I am providing care from the [facility].  I discussed the limitations, risks, security and privacy concerns of performing an evaluation and management service by e-visits and the availability of in person appointments. I also discussed with the patient that there may be a patient responsible charge related to this service. The patient expressed understanding and agreed to proceed.   ASSESSMENT & PLAN:   Jonathon [name] is a 83 y.o. gentleman with a past medical history of dyslipidemia, macular degeneration, degenerative joint disease, anxiety, was referred to our service for evaluation of chronic leukopenia and mild anemia.    Leukopenia Chronic leukopenia with white blood cell count consistently slightly below normal range (3,800 cells/mcL) since at least April 2021. No signs of infection such as fevers, chills, or night sweats. Differential diagnosis includes medication effects, viral infection, vitamin deficiencies (B12, folic acid , iron), or bone marrow insufficiency. Bone marrow function may be slowing due to age.   On his consultation with us  on 12/22/2023, labs actually showed normal white count of 4400 with normal differential.  Hemoglobin also better at 12.8, MCV 98.  Platelet count 198,000.  Iron studies, ferritin were normal.  Coombs  test negative.  LDH, haptoglobin were within normal limits.  Vitamin B12 level was decreased at 140 pg/mL.  Methylmalonic acid was elevated at 636 nmol/mL.  Folate within normal limits.  ANA, rheumatoid factor negative.    Started him on oral B12 supplements at 1000 mcg daily dose.    Consider bone marrow biopsy if leukopenia worsens or persists.  Currently no indication for the same.  RTC in 3 months as scheduled.  Normocytic anemia Mild anemia with hemoglobin levels slightly below normal (11.8 g/dL) for men. No evidence of acute blood loss or gastrointestinal bleeding. Mild anemia with previous hemoglobin levels of 11.5 g/dL in May and 87.0 g/dL in October and November of the previous year. Positive fecal occult blood test indicating possible gastrointestinal bleeding. No recent colonoscopy, but history of multiple colonoscopies in the past. No symptoms of gastrointestinal distress such as heartburn or acid reflux. No recent unintentional weight loss or changes in appetite.  On 12/22/2023, hemoglobin was 12.8.  B12 deficiency noted.  Started on B12 supplementsvitamin deficiencies      I discussed the assessment and treatment plan with the patient. The patient was provided an opportunity to ask questions and all were answered. The patient agreed with the plan and demonstrated an understanding of the instructions. The patient was advised to call back or seek an in-person evaluation if the symptoms worsen or if the condition fails to improve as anticipated.    I spent 12 minutes over the phone with the patient reviewing test results, discuss management and coordination/planning of care.  Chinita Patten, MD 12/29/2023 5:10 PM [facility] OF [facility] [address] [place] [address] Dept:  [phone] Dept Fax: [phone]   INTERVAL HISTORY:  Please see above for problem oriented charting.  The purpose  of today's discussion is to explain recent lab results and to formulate plan of care.  Discussed the use of AI scribe software for clinical note transcription with the patient, who gave verbal consent to proceed.  History of Present Illness Jonathon [name] is an 83 year old male who presents with low white blood cell count. He was referred for evaluation of low white blood cell count.  He has a history of low white blood cell count, which led to the current referral for further evaluation. Recent blood work showed improvement with a white blood cell count of 4,400, which is within normal limits. His hemoglobin level was slightly below normal at 12.8, with normal being 13. Platelet count and other chemistry panels, including kidney and liver function tests, were normal. Iron levels were also normal.  A vitamin B12 deficiency was identified.   SUMMARY OF HEMATOLOGY HISTORY:  He was referred by his primary doctor for evaluation of low white blood cell count.   He has a history of low white blood cell count, fluctuating since at least April 2021, with recent measurements around 3,800, below the normal range of 4,000 to 11,000. No symptoms of infection such as fevers, chills, or night sweats are present.   He also has mild anemia, with a hemoglobin level of 11.8, below the normal range for men, which is 13. This has been a recurring issue, with previous levels recorded at 11.5 in May and 12.9 in October and November of the previous year. No known blood loss, including no black or bloody stools, although he does not regularly check his stools. A stool test revealed occult blood. He recalls a similar finding years ago and has undergone multiple colonoscopies in the past, though not recently. No symptoms of heartburn or acid reflux are reported.   He reports a lack of appetite, particularly in the mornings, and does not eat breakfast, a pattern he notes is common among his peers. Despite this, he denies  any unintentional weight loss. He consumes soft drinks regularly, which his caregiver suggests might affect his appetite.   His current medications include Adriene's and vitamin D3, which he takes regularly. No recurrent infections, heartburn, or acid reflux are reported. He experiences cold feet at night but attributes it to inadequate covering.   Chronic leukopenia with white blood cell count consistently slightly below normal range (3,800 cells/mcL) since at least April 2021. No signs of infection such as fevers, chills, or night sweats. Differential diagnosis includes medication effects, viral infection, vitamin deficiencies (B12, folic acid , iron), or bone marrow insufficiency. Bone marrow function may be slowing due to age.   On his consultation with us  on 12/22/2023, labs actually showed normal white count of 4400 with normal differential.  Hemoglobin also better at 12.8, MCV 98.  Platelet count 198,000.  Iron studies, ferritin were normal.  Coombs test negative.  LDH, haptoglobin were within normal limits.  Vitamin B12 level was decreased at 140 pg/mL.  Methylmalonic acid was elevated at 636 nmol/mL.  Folate within normal limits.  ANA, rheumatoid factor negative.    Started him on oral B12 supplements at 1000 mcg daily dose.    Consider bone marrow biopsy if leukopenia worsens or persists.  Currently no indication for the same.   Normocytic anemia Mild anemia with hemoglobin levels slightly below normal (11.8 g/dL) for men. No evidence of acute blood  loss or gastrointestinal bleeding. Mild anemia with previous hemoglobin levels of 11.5 g/dL in May and 87.0 g/dL in October and November of the previous year. Positive fecal occult blood test indicating possible gastrointestinal bleeding. No recent colonoscopy, but history of multiple colonoscopies in the past. No symptoms of gastrointestinal distress such as heartburn or acid reflux. No recent unintentional weight loss or changes in appetite.  On  12/22/2023, hemoglobin was 12.8.  B12 deficiency noted.  Started on B12 supplements  REVIEW OF SYSTEMS:    Review of Systems - Oncology  All other pertinent systems were reviewed with the patient and are negative.  I have reviewed the past medical history, past surgical history, social history and family history with the patient and they are unchanged from previous note.  ALLERGIES:  He is allergic to penicillins.  MEDICATIONS:  Current Outpatient Medications  Medication Sig Dispense Refill   cyanocobalamin  (VITAMIN B12) 1000 MCG tablet Take 1 tablet (1,000 mcg total) by mouth daily. 90 tablet 3   aspirin EC 81 MG tablet Take 81 mg by mouth daily.     Aspirin-Caffeine 845-65 MG PACK Take 1 packet by mouth daily as needed (Pain).     atorvastatin  (LIPITOR) 20 MG tablet TAKE 1 TABLET BY MOUTH EVERY DAY 90 tablet 3   cholecalciferol (VITAMIN D3) 25 MCG (1000 UNIT) tablet Take 1,000 Units by mouth daily.     docusate sodium  (COLACE) 50 MG capsule Take 50 mg by mouth daily as needed for mild constipation.     escitalopram  (LEXAPRO ) 5 MG tablet TAKE 1 TABLET (5 MG TOTAL) BY MOUTH DAILY. 90 tablet 3   gabapentin  (NEURONTIN ) 400 MG capsule TAKE 1 CAPSULE MIDDAY AND 2 CAPSULES AT BEDTIME. 270 capsule 5   HYDROcodone -acetaminophen  (NORCO) 5-325 MG tablet Take 1 tablet by mouth every 6 (six) hours as needed for severe pain (pain score 7-10) (for chronic osteoarthritis of lumbar spine. do not drive for 8 hours after taking). 60 tablet 0   ketoconazole  (NIZORAL ) 2 % cream Apply 1 Application topically daily. For jock itch 60 g 2   Multiple Vitamins-Minerals (PRESERVISION AREDS 2 PO) Take 2 tablets by mouth daily.     polyethylene glycol powder (GLYCOLAX /MIRALAX ) 17 GM/SCOOP powder Take 17 g by mouth daily as needed (Constipation).     No current facility-administered medications for this visit.    PHYSICAL EXAMINATION:    Onc Performance Status - 12/29/23 1200       ECOG Perf Status   ECOG  Perf Status Restricted in physically strenuous activity but ambulatory and able to carry out work of a light or sedentary nature, e.g., light house work, office work      KPS SCALE   KPS % SCORE Able to carry on normal activity, minor s/s of disease          LABORATORY DATA:   I have reviewed the data as listed.  Recent Results (from the past 2160 hours)  Comprehensive metabolic panel with GFR     Status: None   Collection Time: 11/10/23  9:25 AM  Result Value Ref Range   Sodium 141 135 - 145 mEq/L   Potassium 3.7 3.5 - 5.1 mEq/L   Chloride 106 96 - 112 mEq/L   CO2 27 19 - 32 mEq/L   Glucose, Bld 88 70 - 99 mg/dL   BUN 13 6 - 23 mg/dL   Creatinine, Ser 9.12 0.40 - 1.50 mg/dL   Total Bilirubin 0.4 0.2 - 1.2 mg/dL  Alkaline Phosphatase 93 39 - 117 U/L   AST 22 0 - 37 U/L   ALT 17 0 - 53 U/L   Total Protein 6.4 6.0 - 8.3 g/dL   Albumin 3.5 3.5 - 5.2 g/dL   GFR 19.93 >39.99 mL/min    Comment: Calculated using the CKD-EPI Creatinine Equation (2021)   Calcium  8.9 8.4 - 10.5 mg/dL  CBC with Differential/Platelet     Status: Abnormal   Collection Time: 11/10/23  9:25 AM  Result Value Ref Range   WBC 3.5 (L) 4.0 - 10.5 K/uL   RBC 3.61 (L) 4.22 - 5.81 Mil/uL   Hemoglobin 11.5 (L) 13.0 - 17.0 g/dL   HCT 65.8 (L) 60.9 - 47.9 %   MCV 94.4 78.0 - 100.0 fl   MCHC 33.7 30.0 - 36.0 g/dL   RDW 85.4 88.4 - 84.4 %   Platelets 208.0 150.0 - 400.0 K/uL   Neutrophils Relative % 41.8 (L) 43.0 - 77.0 %   Lymphocytes Relative 34.1 12.0 - 46.0 %   Monocytes Relative 10.9 3.0 - 12.0 %   Eosinophils Relative 12.1 (H) 0.0 - 5.0 %   Basophils Relative 1.1 0.0 - 3.0 %   Neutro Abs 1.5 1.4 - 7.7 K/uL   Lymphs Abs 1.2 0.7 - 4.0 K/uL   Monocytes Absolute 0.4 0.1 - 1.0 K/uL   Eosinophils Absolute 0.4 0.0 - 0.7 K/uL   Basophils Absolute 0.0 0.0 - 0.1 K/uL  Hemoglobin A1c     Status: None   Collection Time: 11/10/23  9:25 AM  Result Value Ref Range   Hgb A1c MFr Bld 6.1 4.6 - 6.5 %    Comment:  Glycemic Control Guidelines for People with Diabetes:Non Diabetic:  <6%Goal of Therapy: <7%Additional Action Suggested:  >8%   TSH     Status: None   Collection Time: 11/10/23  9:25 AM  Result Value Ref Range   TSH 0.94 0.35 - 5.50 uIU/mL  Iron, TIBC and Ferritin Panel     Status: None   Collection Time: 11/23/23  8:40 AM  Result Value Ref Range   Iron 70 50 - 180 mcg/dL   TIBC 684 749 - 574 mcg/dL (calc)   %SAT 22 20 - 48 % (calc)   Ferritin 99 24 - 380 ng/mL  CBC with Differential/Platelet     Status: Abnormal   Collection Time: 11/23/23  8:40 AM  Result Value Ref Range   WBC 3.8 (L) 4.0 - 10.5 K/uL   RBC 3.66 (L) 4.22 - 5.81 Mil/uL   Hemoglobin 11.8 (L) 13.0 - 17.0 g/dL   HCT 65.0 (L) 60.9 - 47.9 %   MCV 95.6 78.0 - 100.0 fl   MCHC 33.7 30.0 - 36.0 g/dL   RDW 85.2 88.4 - 84.4 %   Platelets 207.0 150.0 - 400.0 K/uL   Neutrophils Relative % 31.9 (L) 43.0 - 77.0 %   Lymphocytes Relative 44.9 12.0 - 46.0 %   Monocytes Relative 8.7 3.0 - 12.0 %   Eosinophils Relative 13.9 (H) 0.0 - 5.0 %   Basophils Relative 0.6 0.0 - 3.0 %   Neutro Abs 1.2 (L) 1.4 - 7.7 K/uL   Lymphs Abs 1.7 0.7 - 4.0 K/uL   Monocytes Absolute 0.3 0.1 - 1.0 K/uL   Eosinophils Absolute 0.5 0.0 - 0.7 K/uL   Basophils Absolute 0.0 0.0 - 0.1 K/uL  Fecal occult blood, imunochemical([facility])     Status: Abnormal   Collection Time: 12/01/23  9:38 AM   Specimen:  Stool  Result Value Ref Range   Fecal Occult Bld Positive (A) Negative  Direct antiglobulin test (not at [facility])     Status: None   Collection Time: 12/22/23 10:41 AM  Result Value Ref Range   DAT, complement NEG    DAT, IgG      NEG Performed at [facility], [address] [place], [place] [address]   Haptoglobin     Status: None   Collection Time: 12/22/23 10:55 AM  Result Value Ref Range   Haptoglobin 241 38 - 329 mg/dL    Comment: (NOTE) Performed At: [facility] [address] [place], [place]  [id number] Jennette Shorter MD Ey:[id number]   Lactate dehydrogenase     Status: None   Collection Time: 12/22/23 10:55 AM  Result Value Ref Range   LDH 153 98 - 192 U/L    Comment: Performed at [facility], [address] [place], [place] [address]  TSH     Status: None   Collection Time: 12/22/23 10:55 AM  Result Value Ref Range   TSH 1.790 0.350 - 4.500 uIU/mL    Comment: Performed at [facility], [address], [place], [place] [address]  Rheumatoid factor     Status: None   Collection Time: 12/22/23 10:55 AM  Result Value Ref Range   Rheumatoid fact SerPl-aCnc <10.0 <14.0 IU/mL    Comment: (NOTE) Performed At: [facility] [address] [place], [place] [id number] Jennette Shorter MD Ey:[id number]   Flow Cytometry, Peripheral Blood (Oncology)     Status: None   Collection Time: 12/22/23 10:55 AM  Result Value Ref Range   Flow Cytometry TEST CANCELLED PER MD     Comment: Performed at [facility], [address], [place], [place] [address]  ANA w/Reflex if Positive     Status: None   Collection Time: 12/22/23 10:55 AM  Result Value Ref Range   Anti Nuclear Antibody (ANA) Negative Negative    Comment: (NOTE) Performed At: [facility] [address] [place], [place] [id number] Jennette Shorter MD Ey:[id number]   Methylmalonic acid, serum     Status: Abnormal   Collection Time: 12/22/23 10:55 AM  Result Value Ref Range   Methylmalonic Acid, Quantitative 636 (H) 0 - 378 nmol/L    Comment: (NOTE) This test was developed and its performance characteristics determined by [facility]. It has not been cleared or approved by the Food and Drug Administration. Performed At: [facility] [address] [place], [place] [id number] Jennette Shorter MD Ey:[id number]   Folate     Status: None   Collection Time: 12/22/23 10:55 AM  Result Value Ref Range   Folate 6.4 >5.9 ng/mL    Comment: Performed at  [facility], [address], [place], [place] [address]  Vitamin B12     Status: Abnormal   Collection Time: 12/22/23 10:55 AM  Result Value Ref Range   Vitamin B-12 140 (L) 180 - 914 pg/mL    Comment: (NOTE) This assay is not validated for testing neonatal or myeloproliferative syndrome specimens for Vitamin B12 levels. Performed at [facility], [address], [place], [place] [address]   Ferritin     Status: None   Collection Time: 12/22/23 10:55 AM  Result Value Ref Range   Ferritin 137 24 - 336 ng/mL    Comment: Performed at [facility], [address] [place], [place] [address]  Iron and TIBC  Status: None   Collection Time: 12/22/23 10:55 AM  Result Value Ref Range   Iron 74 45 - 182 ug/dL   TIBC 608 749 - 549 ug/dL   Saturation Ratios 19 17.9 - 39.5 %   UIBC 317 ug/dL    Comment: Performed at [facility], [address], [place], [place] [address]  CMP (Cancer Center only)     Status: Abnormal   Collection Time: 12/22/23 10:55 AM  Result Value Ref Range   Sodium 139 135 - 145 mmol/L   Potassium 4.0 3.5 - 5.1 mmol/L   Chloride 103 98 - 111 mmol/L   CO2 26 22 - 32 mmol/L   Glucose, Bld 77 70 - 99 mg/dL    Comment: Glucose reference range applies only to samples taken after fasting for at least 8 hours.   BUN 13 8 - 23 mg/dL   Creatinine 8.94 9.38 - 1.24 mg/dL   Calcium  9.5 8.9 - 10.3 mg/dL   Total Protein 7.2 6.5 - 8.1 g/dL   Albumin 4.0 3.5 - 5.0 g/dL   AST 22 15 - 41 U/L   ALT 15 0 - 44 U/L   Alkaline Phosphatase 129 (H) 38 - 126 U/L   Total Bilirubin 0.4 0.0 - 1.2 mg/dL   GFR, Estimated >39 >39 mL/min    Comment: (NOTE) Calculated using the CKD-EPI Creatinine Equation (2021)    Anion gap 10 5 - 15    Comment: Performed at [facility], [address] [place], [place] [address]  CBC with Differential (Cancer Center Only)     Status: Abnormal   Collection Time: 12/22/23 10:55 AM  Result Value  Ref Range   WBC Count 4.4 4.0 - 10.5 K/uL   RBC 4.02 (L) 4.22 - 5.81 MIL/uL   Hemoglobin 12.8 (L) 13.0 - 17.0 g/dL   HCT 60.6 60.9 - 47.9 %   MCV 97.8 80.0 - 100.0 fL   MCH 31.8 26.0 - 34.0 pg   MCHC 32.6 30.0 - 36.0 g/dL   RDW 85.7 88.4 - 84.4 %   Platelet Count 198 150 - 400 K/uL   nRBC 0.0 0.0 - 0.2 %   Neutrophils Relative % 54 %   Neutro Abs 2.4 1.7 - 7.7 K/uL   Lymphocytes Relative 28 %   Lymphs Abs 1.2 0.7 - 4.0 K/uL   Monocytes Relative 9 %   Monocytes Absolute 0.4 0.1 - 1.0 K/uL   Eosinophils Relative 8 %   Eosinophils Absolute 0.3 0.0 - 0.5 K/uL   Basophils Relative 1 %   Basophils Absolute 0.0 0.0 - 0.1 K/uL   Immature Granulocytes 0 %   Abs Immature Granulocytes 0.01 0.00 - 0.07 K/uL    Comment: Performed at [facility], [address] [place], [place] [address]     RADIOGRAPHIC STUDIES:  No recent pertinent imaging studies available to review.  Orders Placed This Encounter  Procedures   CBC with Differential (Cancer Center Only)    Standing Status:   Future    Expected Date:   03/22/2024    Expiration Date:   06/20/2024   Vitamin B12    Standing Status:   Future    Expected Date:   03/22/2024    Expiration Date:   06/20/2024   Methylmalonic acid, serum    Standing Status:   Future    Expected Date:   03/22/2024    Expiration Date:   06/20/2024     Future Appointments  Date Time Provider Department Center  03/22/2024  8:00 AM [facility] PHLEBOTOMIST [facility] None  03/22/2024  8:30 AM Mccall Lomax, Chinita, MD [facility] None  05/03/2024  8:00 AM [facility] ANNUAL WELLNESS VISIT 1 [facility]  06/02/2024  9:00 AM Hunter, Garnette KIDD, MD [facility] P[facility]    This document was completed utilizing speech recognition software. Grammatical errors, random word insertions, pronoun errors, and incomplete sentences are an occasional consequence of this system due to software limitations, ambient noise, and hardware issues. Any formal questions or concerns about the  content, text or information contained within the body of this dictation should be directly addressed to the provider for clarification.

## 2023-12-29 NOTE — Assessment & Plan Note (Signed)
 Chronic leukopenia with white blood cell count consistently slightly below normal range (3,800 cells/mcL) since at least April 2021. No signs of infection such as fevers, chills, or night sweats. Differential diagnosis includes medication effects, viral infection, vitamin deficiencies (B12, folic acid , iron), or bone marrow insufficiency. Bone marrow function may be slowing due to age.   On his consultation with us  on 12/22/2023, labs actually showed normal white count of 4400 with normal differential.  Hemoglobin also better at 12.8, MCV 98.  Platelet count 198,000.  Iron studies, ferritin were normal.  Coombs test negative.  LDH, haptoglobin were within normal limits.  Vitamin B12 level was decreased at 140 pg/mL.  Methylmalonic acid was elevated at 636 nmol/mL.  Folate within normal limits.  ANA, rheumatoid factor negative.    Started him on oral B12 supplements at 1000 mcg daily dose.    Consider bone marrow biopsy if leukopenia worsens or persists.  Currently no indication for the same.  RTC in 3 months as scheduled.

## 2023-12-29 NOTE — Assessment & Plan Note (Signed)
 Mild anemia with hemoglobin levels slightly below normal (11.8 g/dL) for men. No evidence of acute blood loss or gastrointestinal bleeding. Mild anemia with previous hemoglobin levels of 11.5 g/dL in May and 87.0 g/dL in October and November of the previous year. Positive fecal occult blood test indicating possible gastrointestinal bleeding. No recent colonoscopy, but history of multiple colonoscopies in the past. No symptoms of gastrointestinal distress such as heartburn or acid reflux. No recent unintentional weight loss or changes in appetite.  On 12/22/2023, hemoglobin was 12.8.  B12 deficiency noted.  Started on B12 supplementsvitamin deficiencies

## 2024-01-04 ENCOUNTER — Telehealth: Payer: Self-pay

## 2024-01-04 NOTE — Telephone Encounter (Signed)
 Patient LVM with questions regarding a prescription that was to be called into his [place] [facility]. Clarified with patient that Dr. Autumn had sent in a rx for B12 1000 mcg daily. Patient stated that when they had called the pharmacy, they did not have anything available for him. [facility] and pharmacist confirmed that they did not fill the rx, since it is an OTC vitamin. Patient aware that he can pick up the appropriate dose at his pharmacy OTC. Confirmed name, dosage, and frequency of B12. Patient verbalized an understanding of the information.

## 2024-01-13 ENCOUNTER — Encounter: Payer: Self-pay | Admitting: Gastroenterology

## 2024-01-29 ENCOUNTER — Other Ambulatory Visit: Payer: Self-pay | Admitting: Family Medicine

## 2024-01-29 MED ORDER — HYDROCODONE-ACETAMINOPHEN 5-325 MG PO TABS: 1.0000 | ORAL_TABLET | Freq: Four times a day (QID) | ORAL | 0 refills | Status: DC | PRN

## 2024-01-29 NOTE — Telephone Encounter (Signed)
 Copied from CRM #[id number]. Topic: Clinical - Medication Refill >> Jan 29, 2024 10:06 AM Jayma L wrote: Medication: HYDROcodone -acetaminophen  (NORCO) 5-325 MG tablet  Has the patient contacted their pharmacy? Yes (Agent: If no, request that the patient contact the pharmacy for the refill. If patient does not wish to contact the pharmacy document the reason why and proceed with request.) (Agent: If yes, when and what did the pharmacy advise?)  This is the patient's preferred pharmacy:  [id number] [place], [place] - [address] [place] [address] Phone: [phone] Fax: [phone]  Is this the correct pharmacy for this prescription? Yes If no, delete pharmacy and type the correct one.   Has the prescription been filled recently? No  Is the patient out of the medication? Yes  Has the patient been seen for an appointment in the last year OR does the patient have an upcoming appointment? Yes  Can we respond through MyChart? No  Agent: Please be advised that Rx refills may take up to 3 business days. We ask that you follow-up with your pharmacy.

## 2024-03-02 ENCOUNTER — Ambulatory Visit (INDEPENDENT_AMBULATORY_CARE_PROVIDER_SITE_OTHER): Admitting: Gastroenterology

## 2024-03-02 ENCOUNTER — Encounter: Payer: Self-pay | Admitting: Gastroenterology

## 2024-03-02 VITALS — BP 116/60 | HR 64 | Ht 69.0 in | Wt 195.4 lb

## 2024-03-02 DIAGNOSIS — R195 Other fecal abnormalities: Secondary | ICD-10-CM | POA: Diagnosis not present

## 2024-03-02 DIAGNOSIS — E538 Deficiency of other specified B group vitamins: Secondary | ICD-10-CM | POA: Insufficient documentation

## 2024-03-02 MED ORDER — NA SULFATE-K SULFATE-MG SULF 17.5-3.13-1.6 GM/177ML PO SOLN: 1.0000 | Freq: Once | ORAL | 0 refills | Status: AC

## 2024-03-02 NOTE — Patient Instructions (Signed)
 You have been scheduled for an endoscopy and colonoscopy. Please follow the written instructions given to you at your visit today.  If you use inhalers (even only as needed), please bring them with you on the day of your procedure.  DO NOT TAKE 7 DAYS PRIOR TO TEST- Trulicity (dulaglutide) Ozempic, Wegovy (semaglutide) Mounjaro (tirzepatide) Bydureon Bcise (exanatide extended release)  DO NOT TAKE 1 DAY PRIOR TO YOUR TEST Rybelsus (semaglutide) Adlyxin (lixisenatide) Victoza (liraglutide) Byetta (exanatide) ________________________________________________________________________

## 2024-03-02 NOTE — Progress Notes (Signed)
 03/02/2024 Jonathon [name] [id number] December 28, 1940   HISTORY OF PRESENT ILLNESS: This is an 83 year old male who is new to our office.  He has previous GI history with Dr. Kristie.  He tells me he has had colonoscopy with her, the last well over 5 years ago.  Looks like she saw him in 2017 for a food impaction at the hospital.  He says that that was the last that he had ever seen her.  He is here today because of positive fecal immunochemistry stool test.  This was performed by his PCP.  Hemoglobin slightly low at 11.5 g.  Fecal immunochemistry test was performed and was positive.  Iron studies are normal.  Folate is low normal at 6.4.  Vitamin B12 level found to be really low at 140.  He has been started on oral vitamin B12 supplements via his PCP.  He denies any black or bloody stool.  He says that he has not had any recurrent issues with food getting stuck.  No heartburn or reflux.  Reports having occasional constipation, but says that he just takes stool softeners and MiraLAX  as needed.  Past Medical History:  Diagnosis Date   Anxiety    never tried on SSRI. xanax  0.5mg  BID through Dr. Linnie.    DDD (degenerative disc disease), lumbar    lumbar DDD- follows with orthopedist Dr. Lucilla schultz ortho. mobic  15mg  daily and hydrocodone  5/325 prn   Family history of breast cancer    HLD (hyperlipidemia)    Macular degeneration 07/21/2016   Follows with optho- areds2. He states he is unsure of diagnosis.    Neuropathy    unknown cause ? back related(sees ortho for back). burning sensation in feet. gabapentin  100mg  after dinner, 2 before bed   Sleep apnea treated with continuous positive airway pressure (CPAP)    Smoker    8-10 cigarettes a day. plus nicorette. 60 years. over 50 pack years   Venous stasis     in the past- Wound center on legs. Elevate. Compression stockings.    Past Surgical History:  Procedure Laterality Date   ESOPHAGOGASTRODUODENOSCOPY Left 05/28/2016    Surgeon:  Renaye Kristie, MD; food stuck in esophagus   LUMBAR LAMINECTOMY/DECOMPRESSION MICRODISCECTOMY Left 09/13/2020   Procedure: Left Lumbar four-five Laminectomy for facet/synovial cyst;  Surgeon: Colon Shove, MD;  Location: [facility] OR;  Service: Neurosurgery;  Laterality: Left;    reports that he has quit smoking. His smoking use included cigarettes. He started smoking about 67 years ago. He has a 16.9 pack-year smoking history. He has never used smokeless tobacco. He reports that he does not currently use alcohol. He reports that he does not use drugs. family history includes Alcohol abuse in his brother and father; Breast cancer in his maternal aunt and sister; Breast cancer (age of onset: 38) in an other family member; Cancer in his sister; Colon cancer in his maternal uncle; Dementia in his mother; Other in his mother and sister. Allergies  Allergen Reactions   Penicillins Rash    Has patient had a PCN reaction causing immediate rash, facial/tongue/throat swelling, SOB or lightheadedness with hypotension: no Has patient had a PCN reaction causing severe rash involving mucus membranes or skin necrosis:yes Has patient had a PCN reaction that required hospitalization: no Has patient had a PCN reaction occurring within the last 10 years: no If all of the above answers are NO, then may proceed with Cephalosporin use.       Outpatient Encounter Medications  as of 03/02/2024  Medication Sig   aspirin EC 81 MG tablet Take 81 mg by mouth daily.   Aspirin-Caffeine 845-65 MG PACK Take 1 packet by mouth daily as needed (Pain).   atorvastatin  (LIPITOR) 20 MG tablet TAKE 1 TABLET BY MOUTH EVERY DAY   cholecalciferol (VITAMIN D3) 25 MCG (1000 UNIT) tablet Take 1,000 Units by mouth daily.   cyanocobalamin  (VITAMIN B12) 1000 MCG tablet Take 1 tablet (1,000 mcg total) by mouth daily.   docusate sodium  (COLACE) 50 MG capsule Take 50 mg by mouth daily as needed for mild constipation.   escitalopram  (LEXAPRO ) 5 MG  tablet TAKE 1 TABLET (5 MG TOTAL) BY MOUTH DAILY.   gabapentin  (NEURONTIN ) 400 MG capsule TAKE 1 CAPSULE MIDDAY AND 2 CAPSULES AT BEDTIME.   HYDROcodone -acetaminophen  (NORCO) 5-325 MG tablet Take 1 tablet by mouth every 6 (six) hours as needed for severe pain (pain score 7-10) (for chronic osteoarthritis of lumbar spine. do not drive for 8 hours after taking).   ketoconazole  (NIZORAL ) 2 % cream Apply 1 Application topically daily. For jock itch   Multiple Vitamins-Minerals (PRESERVISION AREDS 2 PO) Take 2 tablets by mouth daily.   polyethylene glycol powder (GLYCOLAX /MIRALAX ) 17 GM/SCOOP powder Take 17 g by mouth daily as needed (Constipation).   No facility-administered encounter medications on file as of 03/02/2024.     REVIEW OF SYSTEMS  : All other systems reviewed and negative except where noted in the History of Present Illness.   PHYSICAL EXAM: BP 116/60 (BP Location: Left Arm, Patient Position: Sitting, Cuff Size: Normal)   Pulse 64   Ht 5' 9 (1.753 m) Comment: height measured without shoes  Wt 195 lb 6 oz (88.6 kg)   BMI 28.85 kg/m  General: Well developed white male in no acute distress Head: Normocephalic and atraumatic Eyes:  Sclerae anicteric, conjunctiva pink. Ears: Normal auditory acuity Lungs: Clear throughout to auscultation; no W/R/R. Heart: Regular rate and rhythm; no M/R/G. Abdomen: Soft, non-distended.  BS present.  Non-tender. Musculoskeletal: Symmetrical with no gross deformities  Skin: No lesions on visible extremities Extremities: No edema  Neurological: Alert oriented x 4, grossly non-focal Psychological:  Alert and cooperative. Normal mood and affect  ASSESSMENT AND PLAN: *Positive fecal immunochemistry: Says that his last colonoscopy was over 5 years ago with Dr. Kristie.  Recently had a positive fecal immunochemistry at his PCPs office.  No black or bloody stool. *Vitamin B12 deficiency: Hemoglobin 11.5 g.  Iron studies normal.  Folate level low normal,  vitamin B12 very low at 140.  Has started on once daily p.o. vitamin B12 supplementation per his PCP.  **Will plan for EGD and colonoscopy with Dr. Nandigam.  The risks, benefits, and alternatives to EGD and colonoscopy were discussed with the patient and he consents to proceed.    CC:  Katrinka Garnette KIDD, MD

## 2024-03-22 ENCOUNTER — Inpatient Hospital Stay: Attending: Oncology

## 2024-03-22 ENCOUNTER — Encounter: Payer: Self-pay | Admitting: Oncology

## 2024-03-22 ENCOUNTER — Inpatient Hospital Stay: Admitting: Oncology

## 2024-03-22 VITALS — BP 115/68 | HR 66 | Temp 97.9°F | Resp 18 | Ht 69.0 in | Wt 195.7 lb

## 2024-03-22 DIAGNOSIS — D72819 Decreased white blood cell count, unspecified: Secondary | ICD-10-CM | POA: Insufficient documentation

## 2024-03-22 DIAGNOSIS — E538 Deficiency of other specified B group vitamins: Secondary | ICD-10-CM | POA: Insufficient documentation

## 2024-03-22 DIAGNOSIS — Z7982 Long term (current) use of aspirin: Secondary | ICD-10-CM | POA: Insufficient documentation

## 2024-03-22 DIAGNOSIS — D649 Anemia, unspecified: Secondary | ICD-10-CM

## 2024-03-22 DIAGNOSIS — Z79899 Other long term (current) drug therapy: Secondary | ICD-10-CM | POA: Diagnosis not present

## 2024-03-22 DIAGNOSIS — E785 Hyperlipidemia, unspecified: Secondary | ICD-10-CM | POA: Insufficient documentation

## 2024-03-22 LAB — CBC WITH DIFFERENTIAL (CANCER CENTER ONLY)
Abs Immature Granulocytes: 0.01 K/uL (ref 0.00–0.07)
Basophils Absolute: 0 K/uL (ref 0.0–0.1)
Basophils Relative: 1 %
Eosinophils Absolute: 0.4 K/uL (ref 0.0–0.5)
Eosinophils Relative: 10 %
HCT: 39.4 % (ref 39.0–52.0)
Hemoglobin: 12.7 g/dL — ABNORMAL LOW (ref 13.0–17.0)
Immature Granulocytes: 0 %
Lymphocytes Relative: 42 %
Lymphs Abs: 1.6 K/uL (ref 0.7–4.0)
MCH: 31.6 pg (ref 26.0–34.0)
MCHC: 32.2 g/dL (ref 30.0–36.0)
MCV: 98 fL (ref 80.0–100.0)
Monocytes Absolute: 0.4 K/uL (ref 0.1–1.0)
Monocytes Relative: 11 %
Neutro Abs: 1.4 K/uL — ABNORMAL LOW (ref 1.7–7.7)
Neutrophils Relative %: 36 %
Platelet Count: 188 K/uL (ref 150–400)
RBC: 4.02 MIL/uL — ABNORMAL LOW (ref 4.22–5.81)
RDW: 14.1 % (ref 11.5–15.5)
WBC Count: 4 K/uL (ref 4.0–10.5)
nRBC: 0 % (ref 0.0–0.2)

## 2024-03-22 LAB — VITAMIN B12: Vitamin B-12: 858 pg/mL (ref 180–914)

## 2024-03-22 NOTE — Assessment & Plan Note (Signed)
 Currently on vitamin B12 supplements.  If persistent B12 deficiency is noted, we will pursue anti-intrinsic factor antibodies and antiparietal cell antibodies testing on return visit to rule out pernicious anemia.

## 2024-03-22 NOTE — Assessment & Plan Note (Addendum)
 Mild anemia with hemoglobin levels slightly below normal (11.8 g/dL) for men. No evidence of acute blood loss or gastrointestinal bleeding. Mild anemia with previous hemoglobin levels of 11.5 g/dL in May and 87.0 g/dL in October and November 2024.  Positive fecal occult blood test indicating possible gastrointestinal bleeding. No recent colonoscopy, but history of multiple colonoscopies in the past. No symptoms of gastrointestinal distress such as heartburn or acid reflux. No recent unintentional weight loss or changes in appetite.  On 12/22/2023, hemoglobin was 12.8.  B12 deficiency noted.  Started on B12 supplements.  Hemoglobin is stable at 12.7 currently.  MCV normal at 98.  Continue B12 supplements.

## 2024-03-22 NOTE — Progress Notes (Signed)
 [facility]  HEMATOLOGY CLINIC PROGRESS NOTE  PATIENT NAME: Jonathon [name]   MR#: [id number] DOB: 09/19/1940  Patient Care Team: Katrinka Garnette KIDD, MD as PCP - General (Family Medicine) Jude Harden GAILS, MD as Consulting Physician (Pulmonary Disease) Cleatus Collar, MD as Consulting Physician (Ophthalmology) Kristie Lamprey, MD as Consulting Physician (Gastroenterology) Clinic, Bonni Lien as Consulting Physician  Date of visit: 03/22/2024   ASSESSMENT & PLAN:   Jonathon [name] is a 83 y.o. gentleman with a past medical history of dyslipidemia, macular degeneration, degenerative joint disease, anxiety, was referred to our service in July 2025 for evaluation of chronic intermittent leukopenia and mild anemia. Workup revealed evidence of B12 deficiency.  Leukopenia Chronic leukopenia with white blood cell count consistently slightly below normal range (3,800 cells/mcL) since at least April 2021. No signs of infection such as fevers, chills, or night sweats. Differential diagnosis includes medication effects, viral infection, vitamin deficiencies (B12, folic acid , iron), or bone marrow insufficiency. Bone marrow function may be slowing due to age.   On his consultation with us  on 12/22/2023, labs actually showed normal white count of 4400 with normal differential.  Hemoglobin also better at 12.8, MCV 98.  Platelet count 198,000.  Iron studies, ferritin were normal.  Coombs test negative.  LDH, haptoglobin were within normal limits.  Vitamin B12 level was decreased at 140 pg/mL.  Methylmalonic acid was elevated at 636 nmol/mL.  Folate within normal limits.  ANA, rheumatoid factor negative.    Started him on oral B12 supplements at 1000 mcg daily dose from July 2025.  Labs today showed stable white count of 4000 with normal differential, ANC of 1400.    Consider bone marrow biopsy if leukopenia worsens or persists.  Currently no indication for the same.  RTC in 4 months for  follow-up with repeat labs.  Normocytic anemia Mild anemia with hemoglobin levels slightly below normal (11.8 g/dL) for men. No evidence of acute blood loss or gastrointestinal bleeding. Mild anemia with previous hemoglobin levels of 11.5 g/dL in May and 87.0 g/dL in October and November 2024.  Positive fecal occult blood test indicating possible gastrointestinal bleeding. No recent colonoscopy, but history of multiple colonoscopies in the past. No symptoms of gastrointestinal distress such as heartburn or acid reflux. No recent unintentional weight loss or changes in appetite.  On 12/22/2023, hemoglobin was 12.8.  B12 deficiency noted.  Started on B12 supplements.  Hemoglobin is stable at 12.7 currently.  MCV normal at 98.  Continue B12 supplements.  Vitamin B 12 deficiency Currently on vitamin B12 supplements.  If persistent B12 deficiency is noted, we will pursue anti-intrinsic factor antibodies and antiparietal cell antibodies testing on return visit to rule out pernicious anemia.   I spent a total of 20 minutes during this encounter with the patient including review of chart and various tests results, discussions about plan of care and coordination of care plan.  I reviewed lab results and outside records for this visit and discussed relevant results with the patient. Diagnosis, plan of care and treatment options were also discussed in detail with the patient. Opportunity provided to ask questions and answers provided to his apparent satisfaction. Provided instructions to call our clinic with any problems, questions or concerns prior to return visit. I recommended to continue follow-up with PCP and sub-specialists. He verbalized understanding and agreed with the plan. No barriers to learning was detected.  Chinita Patten, MD  03/22/2024 12:35 PM  [facility]  [facility] [place] - A DEPT OF [facility] [address] [place]  [address] Dept: [phone] Dept Fax: [phone]   CHIEF COMPLAINT/ REASON FOR VISIT:  Follow-up for chronic intermittent leukopenia and mild normocytic anemia.  B12 deficiency noted.  Otherwise grossly negative hematological workup.  INTERVAL HISTORY:  Discussed the use of AI scribe software for clinical note transcription with the patient, who gave verbal consent to proceed.  History of Present Illness Jonathon [name] is an 83 year old male who presents for follow-up of his blood counts and vitamin B12 levels.  Over the past three months, he has not experienced any changes in his condition. He reports no fevers, chills, night sweats, or signs of infection. He takes vitamin B12 supplements once daily as prescribed.  He has not noticed any blood loss.  He mentions a decreased appetite, stating 'I don't eat much anymore,' and prefers drinking Pepsi Colas due to the carbonation and sugary taste, which he believes might be maintaining his weight.  Previous workup, including checks for autoimmune issues and iron levels, showed normal results. Iron levels were normal at the last check, and vitamin B12 levels are being monitored to assess response to oral supplementation.  No tingling, numbness, or balance issues, which can be associated with low B12 levels.   SUMMARY OF HEMATOLOGIC HISTORY:  He was referred by his primary doctor for evaluation of low white blood cell count.   He has a history of low white blood cell count, fluctuating since at least April 2021, with recent measurements around 3,800, below the normal range of 4,000 to 11,000. No symptoms of infection such as fevers, chills, or night sweats are present.   He also has mild anemia, with a hemoglobin level of 11.8, below the normal range for men, which is 13. This has been a recurring issue, with previous levels recorded at 11.5 in May and 12.9 in October and November of the previous year. No known blood loss, including no  black or bloody stools, although he does not regularly check his stools. A stool test revealed occult blood. He recalls a similar finding years ago and has undergone multiple colonoscopies in the past, though not recently. No symptoms of heartburn or acid reflux are reported.   He reports a lack of appetite, particularly in the mornings, and does not eat breakfast, a pattern he notes is common among his peers. Despite this, he denies any unintentional weight loss. He consumes soft drinks regularly, which his caregiver suggests might affect his appetite.   His current medications include Adriene's and vitamin D3, which he takes regularly. No recurrent infections, heartburn, or acid reflux are reported. He experiences cold feet at night but attributes it to inadequate covering.   Chronic leukopenia with white blood cell count consistently slightly below normal range (3,800 cells/mcL) since at least April 2021. No signs of infection such as fevers, chills, or night sweats. Differential diagnosis includes medication effects, viral infection, vitamin deficiencies (B12, folic acid , iron), or bone marrow insufficiency. Bone marrow function may be slowing due to age.   On his consultation with us  on 12/22/2023, labs actually showed normal white count of 4400 with normal differential.  Hemoglobin also better at 12.8, MCV 98.  Platelet count 198,000.  Iron studies, ferritin were normal.  Coombs test negative.  LDH, haptoglobin were within normal limits.  Vitamin B12 level was decreased at 140 pg/mL.  Methylmalonic acid was elevated at 636 nmol/mL.  Folate within normal  limits.  ANA, rheumatoid factor negative.     Started him on oral B12 supplements at 1000 mcg daily dose.    Consider bone marrow biopsy if leukopenia worsens or persists.  Currently no indication for the same.   Normocytic anemia Mild anemia with hemoglobin levels slightly below normal (11.8 g/dL) for men. No evidence of acute blood loss or  gastrointestinal bleeding. Mild anemia with previous hemoglobin levels of 11.5 g/dL in May and 87.0 g/dL in October and November of the previous year. Positive fecal occult blood test indicating possible gastrointestinal bleeding. No recent colonoscopy, but history of multiple colonoscopies in the past. No symptoms of gastrointestinal distress such as heartburn or acid reflux. No recent unintentional weight loss or changes in appetite.   On 12/22/2023, hemoglobin was 12.8.  B12 deficiency noted.  Started on B12 supplements.  I have reviewed the past medical history, past surgical history, social history and family history with the patient and they are unchanged from previous note.  ALLERGIES: He is allergic to penicillins.  MEDICATIONS:  Current Outpatient Medications  Medication Sig Dispense Refill   aspirin EC 81 MG tablet Take 81 mg by mouth daily.     Aspirin-Caffeine 845-65 MG PACK Take 1 packet by mouth daily as needed (Pain).     atorvastatin  (LIPITOR) 20 MG tablet TAKE 1 TABLET BY MOUTH EVERY DAY 90 tablet 3   cholecalciferol (VITAMIN D3) 25 MCG (1000 UNIT) tablet Take 1,000 Units by mouth daily.     cyanocobalamin  (VITAMIN B12) 1000 MCG tablet Take 1 tablet (1,000 mcg total) by mouth daily. 90 tablet 3   docusate sodium  (COLACE) 50 MG capsule Take 50 mg by mouth daily as needed for mild constipation.     escitalopram  (LEXAPRO ) 5 MG tablet TAKE 1 TABLET (5 MG TOTAL) BY MOUTH DAILY. 90 tablet 3   gabapentin  (NEURONTIN ) 400 MG capsule TAKE 1 CAPSULE MIDDAY AND 2 CAPSULES AT BEDTIME. 270 capsule 5   HYDROcodone -acetaminophen  (NORCO) 5-325 MG tablet Take 1 tablet by mouth every 6 (six) hours as needed for severe pain (pain score 7-10) (for chronic osteoarthritis of lumbar spine. do not drive for 8 hours after taking). 60 tablet 0   ketoconazole  (NIZORAL ) 2 % cream Apply 1 Application topically daily. For jock itch 60 g 2   Multiple Vitamins-Minerals (PRESERVISION AREDS 2 PO) Take 2 tablets by  mouth daily.     polyethylene glycol powder (GLYCOLAX /MIRALAX ) 17 GM/SCOOP powder Take 17 g by mouth daily as needed (Constipation).     No current facility-administered medications for this visit.     REVIEW OF SYSTEMS:    Review of Systems - Oncology  All other pertinent systems were reviewed with the patient and are negative.  PHYSICAL EXAMINATION:   Onc Performance Status - 03/22/24 0821       ECOG Perf Status   ECOG Perf Status Restricted in physically strenuous activity but ambulatory and able to carry out work of a light or sedentary nature, e.g., light house work, office work      KPS SCALE   KPS % SCORE Able to carry on normal activity, minor s/s of disease          Vitals:   03/22/24 0816  BP: 115/68  Pulse: 66  Resp: 18  Temp: 97.9 F (36.6 C)  SpO2: 100%   Filed Weights   03/22/24 0816  Weight: 195 lb 11.2 oz (88.8 kg)    Physical Exam Constitutional:      General: He  is not in acute distress.    Appearance: Normal appearance.  HENT:     Head: Normocephalic and atraumatic.  Eyes:     Conjunctiva/sclera: Conjunctivae normal.  Cardiovascular:     Rate and Rhythm: Normal rate and regular rhythm.  Pulmonary:     Effort: Pulmonary effort is normal. No respiratory distress.  Abdominal:     General: There is no distension.  Neurological:     General: No focal deficit present.     Mental Status: He is alert and oriented to person, place, and time.  Psychiatric:        Mood and Affect: Mood normal.        Behavior: Behavior normal.      LABORATORY DATA:   I have reviewed the data as listed.  Results for orders placed or performed in visit on 03/22/24  CBC with Differential (Cancer Center Only)  Result Value Ref Range   WBC Count 4.0 4.0 - 10.5 K/uL   RBC 4.02 (L) 4.22 - 5.81 MIL/uL   Hemoglobin 12.7 (L) 13.0 - 17.0 g/dL   HCT 60.5 60.9 - 47.9 %   MCV 98.0 80.0 - 100.0 fL   MCH 31.6 26.0 - 34.0 pg   MCHC 32.2 30.0 - 36.0 g/dL   RDW 85.8  88.4 - 84.4 %   Platelet Count 188 150 - 400 K/uL   nRBC 0.0 0.0 - 0.2 %   Neutrophils Relative % 36 %   Neutro Abs 1.4 (L) 1.7 - 7.7 K/uL   Lymphocytes Relative 42 %   Lymphs Abs 1.6 0.7 - 4.0 K/uL   Monocytes Relative 11 %   Monocytes Absolute 0.4 0.1 - 1.0 K/uL   Eosinophils Relative 10 %   Eosinophils Absolute 0.4 0.0 - 0.5 K/uL   Basophils Relative 1 %   Basophils Absolute 0.0 0.0 - 0.1 K/uL   Immature Granulocytes 0 %   Abs Immature Granulocytes 0.01 0.00 - 0.07 K/uL    RADIOGRAPHIC STUDIES:  No recent pertinent imaging studies available to review.  Orders Placed This Encounter  Procedures   CBC with Differential (Cancer Center Only)    Standing Status:   Future    Expected Date:   07/23/2024    Expiration Date:   10/21/2024   Vitamin B12    Standing Status:   Future    Expected Date:   07/23/2024    Expiration Date:   10/21/2024   Methylmalonic acid, serum    Standing Status:   Future    Expected Date:   07/23/2024    Expiration Date:   10/21/2024   Anti-parietal antibody    Standing Status:   Future    Expected Date:   07/23/2024    Expiration Date:   10/21/2024   Intrinsic Factor Antibodies    Standing Status:   Future    Expected Date:   07/23/2024    Expiration Date:   10/21/2024     Future Appointments  Date Time Provider Department Center  04/21/2024  7:00 AM Shila Gustav GAILS, MD [facility]  05/03/2024  8:00 AM [facility] ANNUAL WELLNESS VISIT 1 [facility]  06/02/2024  9:00 AM Katrinka Garnette KIDD, MD [facility]  07/19/2024  8:00 AM [facility] PHLEBOTOMIST [facility] None  07/19/2024  8:30 AM Kyiesha Millward, Chinita, MD [facility] None     This document was completed utilizing speech recognition software. Grammatical errors, random word insertions, pronoun errors, and incomplete sentences are an occasional consequence of this system  due to software limitations, ambient noise, and hardware issues. Any formal questions or concerns about the content, text or  information contained within the body of this dictation should be directly addressed to the provider for clarification.

## 2024-03-22 NOTE — Assessment & Plan Note (Addendum)
 Chronic leukopenia with white blood cell count consistently slightly below normal range (3,800 cells/mcL) since at least April 2021. No signs of infection such as fevers, chills, or night sweats. Differential diagnosis includes medication effects, viral infection, vitamin deficiencies (B12, folic acid , iron), or bone marrow insufficiency. Bone marrow function may be slowing due to age.   On his consultation with us  on 12/22/2023, labs actually showed normal white count of 4400 with normal differential.  Hemoglobin also better at 12.8, MCV 98.  Platelet count 198,000.  Iron studies, ferritin were normal.  Coombs test negative.  LDH, haptoglobin were within normal limits.  Vitamin B12 level was decreased at 140 pg/mL.  Methylmalonic acid was elevated at 636 nmol/mL.  Folate within normal limits.  ANA, rheumatoid factor negative.    Started him on oral B12 supplements at 1000 mcg daily dose from July 2025.  Labs today showed stable white count of 4000 with normal differential, ANC of 1400.    Consider bone marrow biopsy if leukopenia worsens or persists.  Currently no indication for the same.  RTC in 4 months for follow-up with repeat labs.

## 2024-03-28 ENCOUNTER — Other Ambulatory Visit: Payer: Self-pay | Admitting: Family Medicine

## 2024-03-28 LAB — METHYLMALONIC ACID, SERUM: Methylmalonic Acid, Quantitative: 179 nmol/L (ref 0–378)

## 2024-03-28 MED ORDER — HYDROCODONE-ACETAMINOPHEN 5-325 MG PO TABS: 1.0000 | ORAL_TABLET | Freq: Four times a day (QID) | ORAL | 0 refills | Status: DC | PRN

## 2024-03-28 NOTE — Telephone Encounter (Signed)
 Copied from CRM [id number]. Topic: Clinical - Medication Refill >> Mar 28, 2024  8:54 AM Viola F wrote: Medication: HYDROcodone -acetaminophen  (NORCO) 5-325 MG tablet [[id number]]  Has the patient contacted their pharmacy? Yes (Agent: If no, request that the patient contact the pharmacy for the refill. If patient does not wish to contact the pharmacy document the reason why and proceed with request.) (Agent: If yes, when and what did the pharmacy advise?)  This is the patient's preferred pharmacy:  [id number] - [place], [place] - [address] [place] [address] Phone: [phone] Fax: [phone]  Is this the correct pharmacy for this prescription? Yes If no, delete pharmacy and type the correct one.   Has the prescription been filled recently? Yes  Is the patient out of the medication? Yes, since last week   Has the patient been seen for an appointment in the last year OR does the patient have an upcoming appointment? Yes  Can we respond through MyChart? Yes  Agent: Please be advised that Rx refills may take up to 3 business days. We ask that you follow-up with your pharmacy.

## 2024-04-07 ENCOUNTER — Other Ambulatory Visit: Payer: Self-pay | Admitting: Family Medicine

## 2024-04-21 ENCOUNTER — Encounter: Payer: Self-pay | Admitting: Gastroenterology

## 2024-04-21 ENCOUNTER — Ambulatory Visit: Admitting: Gastroenterology

## 2024-04-21 VITALS — BP 129/74 | HR 131 | Temp 97.2°F | Resp 19 | Ht 69.0 in | Wt 195.0 lb

## 2024-04-21 DIAGNOSIS — K2270 Barrett's esophagus without dysplasia: Secondary | ICD-10-CM | POA: Diagnosis not present

## 2024-04-21 DIAGNOSIS — K209 Esophagitis, unspecified without bleeding: Secondary | ICD-10-CM | POA: Diagnosis not present

## 2024-04-21 DIAGNOSIS — K208 Other esophagitis without bleeding: Secondary | ICD-10-CM | POA: Diagnosis not present

## 2024-04-21 DIAGNOSIS — K2970 Gastritis, unspecified, without bleeding: Secondary | ICD-10-CM

## 2024-04-21 DIAGNOSIS — K319 Disease of stomach and duodenum, unspecified: Secondary | ICD-10-CM | POA: Diagnosis not present

## 2024-04-21 DIAGNOSIS — R195 Other fecal abnormalities: Secondary | ICD-10-CM

## 2024-04-21 DIAGNOSIS — Z1211 Encounter for screening for malignant neoplasm of colon: Secondary | ICD-10-CM | POA: Diagnosis not present

## 2024-04-21 MED ORDER — SODIUM CHLORIDE 0.9 % IV SOLN: 500.0000 mL | INTRAVENOUS | Status: DC

## 2024-04-21 MED ORDER — PANTOPRAZOLE SODIUM 40 MG PO TBEC: 40.0000 mg | DELAYED_RELEASE_TABLET | Freq: Every day | ORAL | 3 refills | Status: AC

## 2024-04-21 MED ORDER — NA SULFATE-K SULFATE-MG SULF 17.5-3.13-1.6 GM/177ML PO SOLN
1.0000 | Freq: Once | ORAL | 0 refills | Status: AC
Start: 2024-04-21 — End: 2024-04-21

## 2024-04-21 NOTE — Op Note (Signed)
 [facility] Patient Name: Jonathon [name] Procedure Date: 04/21/2024 7:46 AM MRN: [id number] Endoscopist: Gustav ALONSO Mcgee , MD, [id number] Age: 83 Referring MD:  Date of Birth: [birth year] Gender: Male Account #: [id number] Procedure:                Upper GI endoscopy Indications:              Gastrointestinal bleeding of unknown origin Medicines:                Monitored Anesthesia Care Procedure:                Pre-Anesthesia Assessment:                           - Prior to the procedure, a History and Physical                            was performed, and patient medications and                            allergies were reviewed. The patient's tolerance of                            previous anesthesia was also reviewed. The risks                            and benefits of the procedure and the sedation                            options and risks were discussed with the patient.                            All questions were answered, and informed consent                            was obtained. Prior Anticoagulants: The patient has                            taken no anticoagulant or antiplatelet agents. ASA                            Grade Assessment: III - A patient with severe                            systemic disease. After reviewing the risks and                            benefits, the patient was deemed in satisfactory                            condition to undergo the procedure.                           After obtaining informed consent, the endoscope was  passed under direct vision. Throughout the                            procedure, the patient's blood pressure, pulse, and                            oxygen saturations were monitored continuously. The                            GIF [id number] #[id number] was introduced through the                            mouth, and advanced to the second part of duodenum.                            The  upper GI endoscopy was accomplished without                            difficulty. The patient tolerated the procedure                            well. Scope In: Scope Out: Findings:                 There were esophageal mucosal changes suggestive of                            long-segment Barrett's esophagus present in the                            lower third of the esophagus. The maximum                            longitudinal extent of these mucosal changes was 4                            cm in length. Biopsies were taken with a cold                            forceps for histology.                           Patchy mild inflammation characterized by adherent                            blood, congestion (edema), erosions, erythema and                            friability was found in the entire examined                            stomach. Biopsies were taken with a cold forceps                            for Helicobacter pylori testing.  The cardia and gastric fundus were normal on                            retroflexion.                           The examined duodenum was normal. Complications:            No immediate complications. Estimated Blood Loss:     Estimated blood loss was minimal. Impression:               - Esophageal mucosal changes suggestive of                            long-segment Barrett's esophagus. Biopsied.                           - Gastritis. Biopsied.                           - Normal examined duodenum. Recommendation:           - Resume previous diet.                           - Continue present medications.                           - Await pathology results.                           - Follow an antireflux regimen.                           - Use Protonix (pantoprazole) 40 mg PO daily. Francis Yardley V. Atilano Covelli, MD 04/21/2024 8:48:17 AM This report has been signed electronically.

## 2024-04-21 NOTE — Progress Notes (Signed)
 [facility] History and Physical   Primary Care Physician:  Katrinka Garnette KIDD, MD   Reason for Procedure:  Heme positive stool, positive FIT  Plan:    EGD and colonoscopy with possible interventions as needed     HPI: Jonathon [name] is a very pleasant 83 y.o. male here for EGD and colonoscopy for Heme positive stool, positive FIT.  Please refer to office visit note by Jessica Zehr for details  The risks and benefits as well as alternatives of endoscopic procedure(s) have been discussed and reviewed.  The patient was provided an opportunity to ask questions and all were answered. The patient agreed with the plan and demonstrated an understanding of the instructions.   Past Medical History:  Diagnosis Date   Anxiety    never tried on SSRI. xanax  0.5mg  BID through Dr. Linnie.    DDD (degenerative disc disease), lumbar    lumbar DDD- follows with orthopedist Dr. Lucilla schultz ortho. mobic  15mg  daily and hydrocodone  5/325 prn   Family history of breast cancer    HLD (hyperlipidemia)    Macular degeneration 07/21/2016   Follows with optho- areds2. He states he is unsure of diagnosis.    Neuropathy    unknown cause ? back related(sees ortho for back). burning sensation in feet. gabapentin  100mg  after dinner, 2 before bed   Sleep apnea treated with continuous positive airway pressure (CPAP)    Smoker    8-10 cigarettes a day. plus nicorette. 60 years. over 50 pack years   Venous stasis     in the past- Wound center on legs. Elevate. Compression stockings.     Past Surgical History:  Procedure Laterality Date   ESOPHAGOGASTRODUODENOSCOPY Left 05/28/2016    Surgeon: Renaye Sous, MD; food stuck in esophagus   LUMBAR LAMINECTOMY/DECOMPRESSION MICRODISCECTOMY Left 09/13/2020   Procedure: Left Lumbar four-five Laminectomy for facet/synovial cyst;  Surgeon: Colon Shove, MD;  Location: [facility];  Service: Neurosurgery;  Laterality: Left;    Prior to Admission medications    Medication Sig Start Date End Date Taking? Authorizing Provider  atorvastatin  (LIPITOR) 20 MG tablet TAKE 1 TABLET BY MOUTH EVERY DAY 07/09/23  Yes Katrinka Garnette KIDD, MD  cholecalciferol (VITAMIN D3) 25 MCG (1000 UNIT) tablet Take 1,000 Units by mouth daily.   Yes [provider]  cyanocobalamin  (VITAMIN B12) 1000 MCG tablet Take 1 tablet (1,000 mcg total) by mouth daily. 12/29/23  Yes Pasam, Avinash, MD  docusate sodium  (COLACE) 50 MG capsule Take 50 mg by mouth daily as needed for mild constipation.   Yes [provider]  escitalopram  (LEXAPRO ) 5 MG tablet TAKE 1 TABLET (5 MG TOTAL) BY MOUTH DAILY. 08/17/23  Yes Katrinka Garnette KIDD, MD  gabapentin  (NEURONTIN ) 400 MG capsule TAKE 1 CAPSULE MIDDAY AND 2 CAPSULES AT BEDTIME. 04/07/24  Yes Katrinka Garnette KIDD, MD  HYDROcodone -acetaminophen  (NORCO) 5-325 MG tablet Take 1 tablet by mouth every 6 (six) hours as needed for severe pain (pain score 7-10) (for chronic osteoarthritis of lumbar spine. do not drive for 8 hours after taking). 03/28/24  Yes Katrinka Garnette KIDD, MD  Multiple Vitamins-Minerals (PRESERVISION AREDS 2 PO) Take 2 tablets by mouth daily.   Yes [provider]  polyethylene glycol powder (GLYCOLAX /MIRALAX ) 17 GM/SCOOP powder Take 17 g by mouth daily as needed (Constipation).   Yes [provider]  aspirin EC 81 MG tablet Take 81 mg by mouth daily.    [provider]  Aspirin-Caffeine 845-65 MG PACK Take 1 packet by mouth  daily as needed (Pain).    [provider]  ketoconazole  (NIZORAL ) 2 % cream Apply 1 Application topically daily. For jock itch 05/11/23   Katrinka Garnette KIDD, MD    Current Outpatient Medications  Medication Sig Dispense Refill   atorvastatin  (LIPITOR) 20 MG tablet TAKE 1 TABLET BY MOUTH EVERY DAY 90 tablet 3   cholecalciferol (VITAMIN D3) 25 MCG (1000 UNIT) tablet Take 1,000 Units by mouth daily.     cyanocobalamin  (VITAMIN B12) 1000 MCG tablet Take 1 tablet (1,000 mcg  total) by mouth daily. 90 tablet 3   docusate sodium  (COLACE) 50 MG capsule Take 50 mg by mouth daily as needed for mild constipation.     escitalopram  (LEXAPRO ) 5 MG tablet TAKE 1 TABLET (5 MG TOTAL) BY MOUTH DAILY. 90 tablet 3   gabapentin  (NEURONTIN ) 400 MG capsule TAKE 1 CAPSULE MIDDAY AND 2 CAPSULES AT BEDTIME. 270 capsule 5   HYDROcodone -acetaminophen  (NORCO) 5-325 MG tablet Take 1 tablet by mouth every 6 (six) hours as needed for severe pain (pain score 7-10) (for chronic osteoarthritis of lumbar spine. do not drive for 8 hours after taking). 60 tablet 0   Multiple Vitamins-Minerals (PRESERVISION AREDS 2 PO) Take 2 tablets by mouth daily.     polyethylene glycol powder (GLYCOLAX /MIRALAX ) 17 GM/SCOOP powder Take 17 g by mouth daily as needed (Constipation).     aspirin EC 81 MG tablet Take 81 mg by mouth daily.     Aspirin-Caffeine 845-65 MG PACK Take 1 packet by mouth daily as needed (Pain).     ketoconazole  (NIZORAL ) 2 % cream Apply 1 Application topically daily. For jock itch 60 g 2   Current Facility-Administered Medications  Medication Dose Route Frequency Provider Last Rate Last Admin   0.9 %  sodium chloride  infusion  500 mL Intravenous Continuous Ein Rijo V, MD        Allergies as of 04/21/2024 - Review Complete 04/21/2024  Allergen Reaction Noted   Penicillins Rash 05/28/2016    Family History  Problem Relation Age of Onset   Other Mother        old age per patient    Dementia Mother    Alcohol abuse Father        contributed   Cancer Sister        unknown type   Breast cancer Sister        dx in her early 57s   Other Sister        PALB2+   Alcohol abuse Brother    Breast cancer Maternal Aunt    Colon cancer Maternal Uncle    Breast cancer Other 39   Esophageal cancer Neg Hx    Rectal cancer Neg Hx    Stomach cancer Neg Hx     Social History   Socioeconomic History   Marital status: Married    Spouse name: Not on file   Number of children: 3    Years of education: Not on file   Highest education level: Not on file  Occupational History   Occupation: semi retired  Tobacco Use   Smoking status: Former    Current packs/day: 0.25    Average packs/day: 0.3 packs/day for 67.8 years (17.0 ttl pk-yrs)    Types: Cigarettes    Start date: 1958   Smokeless tobacco: Never   Tobacco comments:    02/10/19- .5 pack   Vaping Use   Vaping status: Never Used  Substance and Sexual Activity   Alcohol use: Not  Currently    Comment: rare drinking occasionally   Drug use: No   Sexual activity: Not on file  Other Topics Concern   Not on file  Social History Narrative   Married around 1964. 3 children. 6 grandkids.       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, [employer] on the side   Social Drivers of Corporate Investment Banker Strain: Low Risk  (04/27/2023)   Overall Financial Resource Strain (CARDIA)    Difficulty of Paying Living Expenses: Not hard at all  Food Insecurity: No Food Insecurity (12/22/2023)   Hunger Vital Sign    Worried About Running Out of Food in the Last Year: Never true    Ran Out of Food in the Last Year: Never true  Transportation Needs: No Transportation Needs (12/22/2023)   PRAPARE - Administrator, Civil Service (Medical): No    Lack of Transportation (Non-Medical): No  Physical Activity: Inactive (04/27/2023)   Exercise Vital Sign    Days of Exercise per Week: 0 days    Minutes of Exercise per Session: 0 min  Stress: No Stress Concern Present (04/27/2023)   [employer] of Occupational Health - Occupational Stress Questionnaire    Feeling of Stress : Not at all  Social Connections: Moderately Isolated (04/27/2023)   Social Connection and Isolation Panel    Frequency of Communication with Friends and Family: Once a week    Frequency of Social Gatherings with Friends and Family: More than three times a week    Attends Religious  Services: Never    Database Administrator or Organizations: No    Attends Banker Meetings: Never    Marital Status: Married  Catering Manager Violence: Not At Risk (12/22/2023)   Humiliation, Afraid, Rape, and Kick questionnaire    Fear of Current or Ex-Partner: No    Emotionally Abused: No    Physically Abused: No    Sexually Abused: No    Review of Systems:  All other review of systems negative except as mentioned in the HPI.  Physical Exam: Vital signs in last 24 hours: BP (!) 102/59   Pulse (!) 57   Temp (!) 97.2 F (36.2 C) (Skin)   Resp (!) 7   Ht 5' 9 (1.753 m)   Wt 195 lb (88.5 kg)   SpO2 100%   BMI 28.80 kg/m  General:   Alert, NAD Lungs:  Clear .   Heart:  Regular rate and rhythm Abdomen:  Soft, nontender and nondistended. Neuro/Psych:  Alert and cooperative. Normal mood and affect. A and O x 3  Reviewed labs, radiology imaging, old records and pertinent past GI work up  Patient is appropriate for planned procedure(s) and anesthesia in an ambulatory setting   K. Veena Natahsa Marian , MD [phone]

## 2024-04-21 NOTE — Progress Notes (Signed)
 Sedate, gd SR, tolerated procedure well, VSS, report to RN

## 2024-04-21 NOTE — Op Note (Signed)
 [facility] Patient Name: Jonathon [name] Procedure Date: 04/21/2024 6:55 AM MRN: [id number] Endoscopist: Gustav ALONSO Mcgee , MD, [id number] Age: 83 Referring MD:  Date of Birth: 1941-04-27 Gender: Male Account #: [id number] Procedure:                Colonoscopy Indications:              Evaluation of unexplained GI bleeding presenting                            with fecal occult blood, Positive fecal                            immunochemical test Medicines:                Monitored Anesthesia Care Procedure:                Pre-Anesthesia Assessment:                           - Prior to the procedure, a History and Physical                            was performed, and patient medications and                            allergies were reviewed. The patient's tolerance of                            previous anesthesia was also reviewed. The risks                            and benefits of the procedure and the sedation                            options and risks were discussed with the patient.                            All questions were answered, and informed consent                            was obtained. Prior Anticoagulants: The patient has                            taken no anticoagulant or antiplatelet agents. ASA                            Grade Assessment: III - A patient with severe                            systemic disease. After reviewing the risks and                            benefits, the patient was deemed in satisfactory  condition to undergo the procedure.                           After obtaining informed consent, the colonoscope                            was passed under direct vision. Throughout the                            procedure, the patient's blood pressure, pulse, and                            oxygen saturations were monitored continuously. The                            [id number]-[id number] Colonoscope [id number] was  introduced                            through the anus with the intention of advancing to                            the cecum. The scope was advanced to the sigmoid                            colon before the procedure was aborted. Medications                            were given. The colonoscopy was performed without                            difficulty. The patient tolerated the procedure                            well. The quality of the bowel preparation was                            poor. The rectum was photographed. Scope In: 8:24:49 AM Scope Out: 8:26:24 AM Total Procedure Duration: 0 hours 1 minute 35 seconds  Findings:                 The perianal and digital rectal examinations were                            normal.                           A large amount of stool was found in the rectum, in                            the recto-sigmoid colon and in the sigmoid colon,                            making visualization difficult. Complications:            No immediate complications. Estimated Blood  Loss:     Estimated blood loss: none. Impression:               - Preparation of the colon was poor.                           - Stool in the rectum, in the recto-sigmoid colon                            and in the sigmoid colon. Procedure aborted.                           - No specimens collected. Recommendation:           - Patient has a contact number available for                            emergencies. The signs and symptoms of potential                            delayed complications were discussed with the                            patient. Return to normal activities tomorrow.                            Written discharge instructions were provided to the                            patient.                           - Resume previous diet.                           - Continue present medications.                           - Repeat colonoscopy at the next available                             appointment because the bowel preparation was                            suboptimal. Vedanth Sirico V. Paul Trettin, MD 04/21/2024 8:45:06 AM This report has been signed electronically.

## 2024-04-21 NOTE — Progress Notes (Signed)
 VS by Bernice

## 2024-04-21 NOTE — Progress Notes (Signed)
 Called to room to assist during endoscopic procedure.  Patient ID and intended procedure confirmed with present staff. Received instructions for my participation in the procedure from the performing physician.

## 2024-04-21 NOTE — Patient Instructions (Addendum)
 Educational handout provided to patient related to Gastritis  Resume previous diet  Continue present medications  Repeat colonoscopy- Prep instructions given    YOU HAD AN ENDOSCOPIC PROCEDURE TODAY AT THE [facility]:   Refer to the procedure report that was given to you for any specific questions about what was found during the examination.  If the procedure report does not answer your questions, please call your gastroenterologist to clarify.  If you requested that your care partner not be given the details of your procedure findings, then the procedure report has been included in a sealed envelope for you to review at your convenience later.  YOU SHOULD EXPECT: Some feelings of bloating in the abdomen. Passage of more gas than usual.  Walking can help get rid of the air that was put into your GI tract during the procedure and reduce the bloating. If you had a lower endoscopy (such as a colonoscopy or flexible sigmoidoscopy) you may notice spotting of blood in your stool or on the toilet paper. If you underwent a bowel prep for your procedure, you may not have a normal bowel movement for a few days.  Please Note:  You might notice some irritation and congestion in your nose or some drainage.  This is from the oxygen used during your procedure.  There is no need for concern and it should clear up in a day or so.  SYMPTOMS TO REPORT IMMEDIATELY:  Following lower endoscopy (colonoscopy or flexible sigmoidoscopy):  Excessive amounts of blood in the stool  Significant tenderness or worsening of abdominal pains  Swelling of the abdomen that is new, acute  Fever of 100F or higher  Following upper endoscopy (EGD)  Vomiting of blood or coffee ground material  New chest pain or pain under the shoulder blades  Painful or persistently difficult swallowing  New shortness of breath  Fever of 100F or higher  Black, tarry-looking stools  For urgent or emergent issues, a  gastroenterologist can be reached at any hour by calling [phone]. Do not use MyChart messaging for urgent concerns.    DIET:  We do recommend a small meal at first, but then you may proceed to your regular diet.  Drink plenty of fluids but you should avoid alcoholic beverages for 24 hours.  ACTIVITY:  You should plan to take it easy for the rest of today and you should NOT DRIVE or use heavy machinery until tomorrow (because of the sedation medicines used during the test).    FOLLOW UP: Our staff will call the number listed on your records the next business day following your procedure.  We will call around 7:15- 8:00 am to check on you and address any questions or concerns that you may have regarding the information given to you following your procedure. If we do not reach you, we will leave a message.     If any biopsies were taken you will be contacted by phone or by letter within the next 1-3 weeks.  Please call us  at [phone] if you have not heard about the biopsies in 3 weeks.    SIGNATURES/CONFIDENTIALITY: You and/or your care partner have signed paperwork which will be entered into your electronic medical record.  These signatures attest to the fact that that the information above on your After Visit Summary has been reviewed and is understood.  Full responsibility of the confidentiality of this discharge information lies with you and/or your care-partner.

## 2024-04-22 ENCOUNTER — Telehealth: Payer: Self-pay

## 2024-04-22 NOTE — Telephone Encounter (Signed)
 Attempted to reach patient for post-procedure f/u call. No answer. Left message for him to please not hesitate to call if he has any questions/concerns regarding his care.

## 2024-04-25 ENCOUNTER — Telehealth: Payer: Self-pay | Admitting: Family Medicine

## 2024-04-25 LAB — SURGICAL PATHOLOGY

## 2024-04-25 MED ORDER — HYDROCODONE-ACETAMINOPHEN 5-325 MG PO TABS: 1.0000 | ORAL_TABLET | Freq: Four times a day (QID) | ORAL | 0 refills | Status: DC | PRN

## 2024-04-25 NOTE — Telephone Encounter (Signed)
 Copied from CRM [id number]. Topic: Clinical - Medication Refill >> Apr 25, 2024 12:34 PM Alfonso HERO wrote: Medication: HYDROcodone -acetaminophen  (NORCO) 5-325 MG tablet  Has the patient contacted their pharmacy? No (Agent: If no, request that the patient contact the pharmacy for the refill. If patient does not wish to contact the pharmacy document the reason why and proceed with request.) (Agent: If yes, when and what did the pharmacy advise?)  This is the patient's preferred pharmacy:  [id number] - [place], [place] - [address] [place] [address] Phone: [phone] Fax: [phone]  Is this the correct pharmacy for this prescription? Yes If no, delete pharmacy and type the correct one.   Has the prescription been filled recently? Yes  Is the patient out of the medication? No  Has the patient been seen for an appointment in the last year OR does the patient have an upcoming appointment? Yes  Can we respond through MyChart? Yes  Agent: Please be advised that Rx refills may take up to 3 business days. We ask that you follow-up with your pharmacy.

## 2024-04-27 ENCOUNTER — Ambulatory Visit: Payer: Self-pay | Admitting: Gastroenterology

## 2024-04-30 ENCOUNTER — Encounter: Payer: Self-pay | Admitting: Family Medicine

## 2024-05-03 ENCOUNTER — Ambulatory Visit: Payer: Medicare Other

## 2024-05-03 VITALS — BP 108/62 | HR 84 | Temp 97.6°F | Ht 69.0 in | Wt 191.4 lb

## 2024-05-03 DIAGNOSIS — Z0000 Encounter for general adult medical examination without abnormal findings: Secondary | ICD-10-CM | POA: Diagnosis not present

## 2024-05-03 DIAGNOSIS — Z23 Encounter for immunization: Secondary | ICD-10-CM | POA: Diagnosis not present

## 2024-05-03 NOTE — Progress Notes (Signed)
 Chief Complaint  Patient presents with   Medicare Wellness     Subjective:   Jonathon [name] is a 83 y.o. male who presents for a Medicare Annual Wellness Visit.  Allergies (verified) Penicillins   History: Past Medical History:  Diagnosis Date   Anxiety    never tried on SSRI. xanax  0.5mg  BID through Dr. Linnie.    DDD (degenerative disc disease), lumbar    lumbar DDD- follows with orthopedist Dr. Lucilla schultz ortho. mobic  15mg  daily and hydrocodone  5/325 prn   Family history of breast cancer    HLD (hyperlipidemia)    Macular degeneration 07/21/2016   Follows with optho- areds2. He states he is unsure of diagnosis.    Neuropathy    unknown cause ? back related(sees ortho for back). burning sensation in feet. gabapentin  100mg  after dinner, 2 before bed   Sleep apnea treated with continuous positive airway pressure (CPAP)    Smoker    8-10 cigarettes a day. plus nicorette. 60 years. over 50 pack years   Venous stasis     in the past- Wound center on legs. Elevate. Compression stockings.    Past Surgical History:  Procedure Laterality Date   ESOPHAGOGASTRODUODENOSCOPY Left 05/28/2016    Surgeon: Renaye Sous, MD; food stuck in esophagus   LUMBAR LAMINECTOMY/DECOMPRESSION MICRODISCECTOMY Left 09/13/2020   Procedure: Left Lumbar four-five Laminectomy for facet/synovial cyst;  Surgeon: Colon Shove, MD;  Location: [facility] OR;  Service: Neurosurgery;  Laterality: Left;   Family History  Problem Relation Age of Onset   Other Mother        old age per patient    Dementia Mother    Alcohol abuse Father        contributed   Cancer Sister        unknown type   Breast cancer Sister        dx in her early 15s   Other Sister        PALB2+   Alcohol abuse Brother    Breast cancer Maternal Aunt    Colon cancer Maternal Uncle    Breast cancer Other 39   Esophageal cancer Neg Hx    Rectal cancer Neg Hx    Stomach cancer Neg Hx    Social History   Occupational History    Occupation: semi retired  Tobacco Use   Smoking status: Former    Current packs/day: 0.25    Average packs/day: 0.3 packs/day for 67.9 years (17.0 ttl pk-yrs)    Types: Cigarettes    Start date: 1958   Smokeless tobacco: Never   Tobacco comments:    02/10/19- .5 pack   Vaping Use   Vaping status: Never Used  Substance and Sexual Activity   Alcohol use: Not Currently    Comment: rare drinking occasionally   Drug use: No   Sexual activity: Not on file   Tobacco Counseling Counseling given: Not Answered Tobacco comments: 02/10/19- .5 pack   SDOH Screenings   Food Insecurity: No Food Insecurity (05/03/2024)  Housing: Unknown (05/03/2024)  Transportation Needs: No Transportation Needs (05/03/2024)  Utilities: Not At Risk (05/03/2024)  Depression (PHQ2-9): Low Risk  (05/03/2024)  Financial Resource Strain: Low Risk  (04/27/2023)  Physical Activity: Inactive (05/03/2024)  Social Connections: Moderately Isolated (05/03/2024)  Stress: No Stress Concern Present (05/03/2024)  Tobacco Use: Medium Risk (05/03/2024)  Health Literacy: Adequate Health Literacy (05/03/2024)   See flowsheets for full screening details  Depression Screen PHQ 2 & 9 Depression Scale- Over the  past 2 weeks, how often have you been bothered by any of the following problems? Little interest or pleasure in doing things: 0 Feeling down, depressed, or hopeless (PHQ Adolescent also includes...irritable): 0 PHQ-2 Total Score: 0     Goals Addressed               This Visit's Progress     stay healthy (pt-stated)        Stay healthy        Visit info / Clinical Intake: Medicare Wellness Visit Type:: Subsequent Annual Wellness Visit Persons participating in visit:: patient Medicare Wellness Visit Mode:: In-person (required for WTM) Information given by:: patient Interpreter Needed?: No Pre-visit prep was completed: yes AWV questionnaire completed by patient prior to visit?: no Living arrangements::  lives with spouse/significant other Patient's Overall Health Status Rating: good Typical amount of pain: none Does pain affect daily life?: no Are you currently prescribed opioids?: no  Dietary Habits and Nutritional Risks How many meals a day?: 2 Eats fruit and vegetables daily?: (!) no Most meals are obtained by: eating out; preparing own meals Diabetic:: no  Functional Status Activities of Daily Living (to include ambulation/medication): Independent Ambulation: Independent with device- listed below Home Assistive Devices/Equipment: Eyeglasses; CPAP; Other (Comment) (hearing aids) Manage your own finances?: yes Primary transportation is: driving Concerns about vision?: (!) yes Concerns about hearing?: (!) yes Uses hearing aids?: (!) yes Hear whispered voice?: yes  Fall Screening Falls in the past year?: 0 Number of falls in past year: 0 Was there an injury with Fall?: 0 Fall Risk Category Calculator: 0 Patient Fall Risk Level: Low Fall Risk  Fall Risk Patient at Risk for Falls Due to: No Fall Risks Fall risk Follow up: Falls prevention discussed  Home and Transportation Safety: All rugs have non-skid backing?: N/A, no rugs All stairs or steps have railings?: yes Grab bars in the bathtub or shower?: yes Have non-skid surface in bathtub or shower?: (!) no Good home lighting?: yes Regular seat belt use?: yes Hospital stays in the last year:: no  Cognitive Assessment Difficulty concentrating, remembering, or making decisions? : yes Will 6CIT or Mini Cog be Completed: yes What year is it?: 0 points What month is it?: 0 points Give patient an address phrase to remember (5 components): [address] [place]  About what time is it?: 0 points Count backwards from 20 to 1: 0 points Say the months of the year in reverse: 4 points Repeat the address phrase from earlier: 0 points 6 CIT Score: 4 points  Advance Directives (For Healthcare) Does Patient Have a Medical  Advance Directive?: Yes Type of Advance Directive: Healthcare Power of Attorney Copy of Healthcare Power of Attorney in Chart?: No - copy requested Would patient like information on creating a medical advance directive?: No - Patient declined  Reviewed/Updated  Reviewed/Updated: Reviewed All (Medical, Surgical, Family, Medications, Allergies, Care Teams, Patient Goals)        Objective:    Today's Vitals   05/03/24 0903  BP: 108/62  Pulse: 84  Temp: 97.6 F (36.4 C)  SpO2: 93%  Weight: 191 lb 6.4 oz (86.8 kg)  Height: 5' 9 (1.753 m)   Body mass index is 28.26 kg/m.  Current Medications (verified) Outpatient Encounter Medications as of 05/03/2024  Medication Sig   cholecalciferol (VITAMIN D3) 25 MCG (1000 UNIT) tablet Take 1,000 Units by mouth daily.   cyanocobalamin  (VITAMIN B12) 1000 MCG tablet Take 1 tablet (1,000 mcg total) by mouth daily.  escitalopram  (LEXAPRO ) 5 MG tablet TAKE 1 TABLET (5 MG TOTAL) BY MOUTH DAILY.   gabapentin  (NEURONTIN ) 400 MG capsule TAKE 1 CAPSULE MIDDAY AND 2 CAPSULES AT BEDTIME.   HYDROcodone -acetaminophen  (NORCO) 5-325 MG tablet Take 1 tablet by mouth every 6 (six) hours as needed for severe pain (pain score 7-10) (for chronic osteoarthritis of lumbar spine. do not drive for 8 hours after taking).   ketoconazole  (NIZORAL ) 2 % cream Apply 1 Application topically daily. For jock itch   Multiple Vitamins-Minerals (PRESERVISION AREDS 2 PO) Take 2 tablets by mouth daily.   pantoprazole (PROTONIX) 40 MG tablet Take 1 tablet (40 mg total) by mouth daily.   polyethylene glycol powder (GLYCOLAX /MIRALAX ) 17 GM/SCOOP powder Take 17 g by mouth daily as needed (Constipation).   atorvastatin  (LIPITOR) 20 MG tablet TAKE 1 TABLET BY MOUTH EVERY DAY (Patient not taking: Reported on 05/03/2024)   [DISCONTINUED] aspirin EC 81 MG tablet Take 81 mg by mouth daily.   [DISCONTINUED] Aspirin-Caffeine 845-65 MG PACK Take 1 packet by mouth daily as needed (Pain).    [DISCONTINUED] docusate sodium  (COLACE) 50 MG capsule Take 50 mg by mouth daily as needed for mild constipation.   No facility-administered encounter medications on file as of 05/03/2024.   Hearing/Vision screen Hearing Screening - Comments:: Pt is HOH and wears hearing aids  Vision Screening - Comments:: Wears rx glasses - up to date with routine eye exams with Dr Cleatus  Immunizations and Health Maintenance Health Maintenance  Topic Date Due   COVID-19 Vaccine (5 - 2025-26 season) 02/15/2024   Medicare Annual Wellness (AWV)  05/03/2025   DTaP/Tdap/Td (2 - Td or Tdap) 03/20/2029   Pneumococcal Vaccine: 50+ Years  Completed   Influenza Vaccine  Completed   Zoster Vaccines- Shingrix  Completed   Meningococcal B Vaccine  Aged Out   Hepatitis C Screening  Discontinued        Assessment/Plan:  This is a routine wellness examination for Jonathon [name].  Patient Care Team: Katrinka Garnette KIDD, MD as PCP - General (Family Medicine) Jude Harden GAILS, MD as Consulting Physician (Pulmonary Disease) Cleatus Collar, MD as Consulting Physician (Ophthalmology) Kristie Lamprey, MD as Consulting Physician (Gastroenterology) Clinic, Bonni Lien as Consulting Physician  I have personally reviewed and noted the following in the patient's chart:   Medical and social history Use of alcohol, tobacco or illicit drugs  Current medications and supplements including opioid prescriptions. Functional ability and status Nutritional status Physical activity Advanced directives List of other physicians Hospitalizations, surgeries, and ER visits in previous 12 months Vitals Screenings to include cognitive, depression, and falls Referrals and appointments  Orders Placed This Encounter  Procedures   Flu vaccine HIGH DOSE PF(Fluzone Trivalent)   In addition, I have reviewed and discussed with patient certain preventive protocols, quality metrics, and best practice recommendations. A written personalized care plan  for preventive services as well as general preventive health recommendations were provided to patient.   Ellouise VEAR Haws, LPN   [id number]/81/7974   Return in 1 year (on 05/03/2025).  After Visit Summary: (In Person-Printed) AVS printed and given to the patient  Nurse Notes: nothing significant at this time

## 2024-05-03 NOTE — Patient Instructions (Signed)
 Jonathon [name],  Thank you for taking the time for your Medicare Wellness Visit. I appreciate your continued commitment to your health goals. Please review the care plan we discussed, and feel free to reach out if I can assist you further.  Please note that Annual Wellness Visits do not include a physical exam. Some assessments may be limited, especially if the visit was conducted virtually. If needed, we may recommend an in-person follow-up with your provider.  Ongoing Care Seeing your primary care provider every 3 to 6 months helps us  monitor your health and provide consistent, personalized care.   Referrals If a referral was made during today's visit and you haven't received any updates within two weeks, please contact the referred provider directly to check on the status.  Recommended Screenings:  Health Maintenance  Topic Date Due   Flu Shot  01/15/2024   COVID-19 Vaccine (5 - 2025-26 season) 02/15/2024   Medicare Annual Wellness Visit  05/03/2025   DTaP/Tdap/Td vaccine (2 - Td or Tdap) 03/20/2029   Pneumococcal Vaccine for age over 63  Completed   Zoster (Shingles) Vaccine  Completed   Meningitis B Vaccine  Aged Out   Hepatitis C Screening  Discontinued       05/03/2024    9:08 AM  Advanced Directives  Does Patient Have a Medical Advance Directive? Yes  Type of Advance Directive Healthcare Power of Attorney  Copy of Healthcare Power of Attorney in Chart? No - copy requested    Vision: Annual vision screenings are recommended for early detection of glaucoma, cataracts, and diabetic retinopathy. These exams can also reveal signs of chronic conditions such as diabetes and high blood pressure.  Dental: Annual dental screenings help detect early signs of oral cancer, gum disease, and other conditions linked to overall health, including heart disease and diabetes.  Please see the attached documents for additional preventive care recommendations.

## 2024-05-16 ENCOUNTER — Telehealth: Payer: Self-pay | Admitting: Family Medicine

## 2024-05-16 NOTE — Telephone Encounter (Unsigned)
 Copied from CRM #[id number]. Topic: Clinical - Medication Refill >> May 16, 2024 10:08 AM Avram MATSU wrote: Medication: HYDROcodone -acetaminophen  (NORCO) 5-325 MG tablet [[id number]]  Has the patient contacted their pharmacy? Yes (Agent: If no, request that the patient contact the pharmacy for the refill. If patient does not wish to contact the pharmacy document the reason why and proceed with request.) (Agent: If yes, when and what did the pharmacy advise?)  This is the patient's preferred pharmacy:  [id number] - [place], [place] - [address] [place] [address] Phone: [phone] Fax: [phone]  Is this the correct pharmacy for this prescription? Yes If no, delete pharmacy and type the correct one.   Has the prescription been filled recently? No  Is the patient out of the medication? Yes  Has the patient been seen for an appointment in the last year OR does the patient have an upcoming appointment? Yes  Can we respond through MyChart? No  Agent: Please be advised that Rx refills may take up to 3 business days. We ask that you follow-up with your pharmacy.

## 2024-05-17 MED ORDER — HYDROCODONE-ACETAMINOPHEN 5-325 MG PO TABS: 1.0000 | ORAL_TABLET | Freq: Three times a day (TID) | ORAL | 0 refills | Status: DC | PRN

## 2024-05-25 ENCOUNTER — Encounter: Payer: Self-pay | Admitting: Gastroenterology

## 2024-06-01 ENCOUNTER — Encounter: Payer: Self-pay | Admitting: Gastroenterology

## 2024-06-01 ENCOUNTER — Ambulatory Visit: Admitting: Gastroenterology

## 2024-06-01 VITALS — BP 135/75 | HR 82 | Temp 97.9°F | Resp 20 | Ht 69.0 in | Wt 195.0 lb

## 2024-06-01 DIAGNOSIS — R195 Other fecal abnormalities: Secondary | ICD-10-CM | POA: Diagnosis not present

## 2024-06-01 DIAGNOSIS — K648 Other hemorrhoids: Secondary | ICD-10-CM

## 2024-06-01 DIAGNOSIS — D123 Benign neoplasm of transverse colon: Secondary | ICD-10-CM

## 2024-06-01 DIAGNOSIS — D122 Benign neoplasm of ascending colon: Secondary | ICD-10-CM

## 2024-06-01 DIAGNOSIS — Z1211 Encounter for screening for malignant neoplasm of colon: Secondary | ICD-10-CM

## 2024-06-01 DIAGNOSIS — D124 Benign neoplasm of descending colon: Secondary | ICD-10-CM | POA: Diagnosis not present

## 2024-06-01 MED ORDER — SODIUM CHLORIDE 0.9 % IV SOLN: 500.0000 mL | INTRAVENOUS | Status: DC

## 2024-06-01 NOTE — Progress Notes (Signed)
 [facility] History and Physical   Primary Care Physician:  Katrinka Garnette KIDD, MD   Reason for Procedure:  positive FIT  Plan:     colonoscopy with possible interventions as needed     HPI: Jonathon [name] is a very pleasant 83 y.o. male here for colonoscopy for evaluation of positive FIT. Denies any nausea, vomiting, abdominal pain, melena or bright red blood per rectum  The risks and benefits as well as alternatives of endoscopic procedure(s) have been discussed and reviewed.  The patient was provided an opportunity to ask questions and all were answered. The patient agreed with the plan and demonstrated an understanding of the instructions.   Past Medical History:  Diagnosis Date   Anxiety    never tried on SSRI. xanax  0.5mg  BID through Dr. Linnie.    DDD (degenerative disc disease), lumbar    lumbar DDD- follows with orthopedist Dr. Lucilla schultz ortho. mobic  15mg  daily and hydrocodone  5/325 prn   Family history of breast cancer    HLD (hyperlipidemia)    Macular degeneration 07/21/2016   Follows with optho- areds2. He states he is unsure of diagnosis.    Neuropathy    unknown cause ? back related(sees ortho for back). burning sensation in feet. gabapentin  100mg  after dinner, 2 before bed   Sleep apnea treated with continuous positive airway pressure (CPAP)    Smoker    8-10 cigarettes a day. plus nicorette. 60 years. over 50 pack years   Venous stasis     in the past- Wound center on legs. Elevate. Compression stockings.     Past Surgical History:  Procedure Laterality Date   ESOPHAGOGASTRODUODENOSCOPY Left 05/28/2016    Surgeon: Renaye Sous, MD; food stuck in esophagus   LUMBAR LAMINECTOMY/DECOMPRESSION MICRODISCECTOMY Left 09/13/2020   Procedure: Left Lumbar four-five Laminectomy for facet/synovial cyst;  Surgeon: Colon Shove, MD;  Location: [facility];  Service: Neurosurgery;  Laterality: Left;    Prior to Admission medications  Medication Sig Start  Date End Date Taking? Authorizing Provider  atorvastatin  (LIPITOR) 20 MG tablet TAKE 1 TABLET BY MOUTH EVERY DAY 07/09/23  Yes Katrinka Garnette KIDD, MD  cholecalciferol (VITAMIN D3) 25 MCG (1000 UNIT) tablet Take 1,000 Units by mouth daily.   Yes [provider]  cyanocobalamin  (VITAMIN B12) 1000 MCG tablet Take 1 tablet (1,000 mcg total) by mouth daily. 12/29/23  Yes Pasam, Avinash, MD  escitalopram  (LEXAPRO ) 5 MG tablet TAKE 1 TABLET (5 MG TOTAL) BY MOUTH DAILY. 08/17/23  Yes Katrinka Garnette KIDD, MD  gabapentin  (NEURONTIN ) 400 MG capsule TAKE 1 CAPSULE MIDDAY AND 2 CAPSULES AT BEDTIME. 04/07/24  Yes Katrinka Garnette KIDD, MD  HYDROcodone -acetaminophen  (NORCO) 5-325 MG tablet Take 1 tablet by mouth every 8 (eight) hours as needed for severe pain (pain score 7-10) (for chronic osteoarthritis of lumbar spine. do not drive for 8 hours after taking). Must keep upcoming appointment 05/17/24  Yes Katrinka Garnette KIDD, MD  pantoprazole  (PROTONIX ) 40 MG tablet Take 1 tablet (40 mg total) by mouth daily. 04/21/24  Yes Amely Voorheis V, MD  polyethylene glycol powder (GLYCOLAX /MIRALAX ) 17 GM/SCOOP powder Take 17 g by mouth daily as needed (Constipation).   Yes [provider]  ketoconazole  (NIZORAL ) 2 % cream Apply 1 Application topically daily. For jock itch 05/11/23   Katrinka Garnette KIDD, MD  Multiple Vitamins-Minerals (PRESERVISION AREDS 2 PO) Take 2 tablets by mouth daily.    [provider]    Current Outpatient Medications  Medication Sig Dispense Refill  atorvastatin  (LIPITOR) 20 MG tablet TAKE 1 TABLET BY MOUTH EVERY DAY 90 tablet 3   cholecalciferol (VITAMIN D3) 25 MCG (1000 UNIT) tablet Take 1,000 Units by mouth daily.     cyanocobalamin  (VITAMIN B12) 1000 MCG tablet Take 1 tablet (1,000 mcg total) by mouth daily. 90 tablet 3   escitalopram  (LEXAPRO ) 5 MG tablet TAKE 1 TABLET (5 MG TOTAL) BY MOUTH DAILY. 90 tablet 3   gabapentin  (NEURONTIN ) 400 MG capsule TAKE 1 CAPSULE MIDDAY AND 2  CAPSULES AT BEDTIME. 270 capsule 5   HYDROcodone -acetaminophen  (NORCO) 5-325 MG tablet Take 1 tablet by mouth every 8 (eight) hours as needed for severe pain (pain score 7-10) (for chronic osteoarthritis of lumbar spine. do not drive for 8 hours after taking). Must keep upcoming appointment 12 tablet 0   pantoprazole  (PROTONIX ) 40 MG tablet Take 1 tablet (40 mg total) by mouth daily. 90 tablet 3   polyethylene glycol powder (GLYCOLAX /MIRALAX ) 17 GM/SCOOP powder Take 17 g by mouth daily as needed (Constipation).     ketoconazole  (NIZORAL ) 2 % cream Apply 1 Application topically daily. For jock itch 60 g 2   Multiple Vitamins-Minerals (PRESERVISION AREDS 2 PO) Take 2 tablets by mouth daily.     Current Facility-Administered Medications  Medication Dose Route Frequency Provider Last Rate Last Admin   0.9 %  sodium chloride  infusion  500 mL Intravenous Continuous Kolette Vey V, MD        Allergies as of 06/01/2024 - Review Complete 06/01/2024  Allergen Reaction Noted   Penicillins Rash 05/28/2016    Family History  Problem Relation Age of Onset   Other Mother        old age per patient    Dementia Mother    Alcohol abuse Father        contributed   Cancer Sister        unknown type   Breast cancer Sister        dx in her early 55s   Other Sister        PALB2+   Alcohol abuse Brother    Breast cancer Maternal Aunt    Colon cancer Maternal Uncle    Breast cancer Other 39   Esophageal cancer Neg Hx    Rectal cancer Neg Hx    Stomach cancer Neg Hx     Social History   Socioeconomic History   Marital status: Married    Spouse name: Not on file   Number of children: 3   Years of education: Not on file   Highest education level: Not on file  Occupational History   Occupation: semi retired  Tobacco Use   Smoking status: Some Days    Current packs/day: 0.25    Average packs/day: 0.3 packs/day for 68.0 years (17.0 ttl pk-yrs)    Types: Cigarettes    Start date: 1958    Smokeless tobacco: Never   Tobacco comments:    02/10/19- .5 pack   Vaping Use   Vaping status: Never Used  Substance and Sexual Activity   Alcohol use: Not Currently    Comment: rare drinking occasionally   Drug use: No   Sexual activity: Not on file  Other Topics Concern   Not on file  Social History Narrative   Married around 1964. 3 children. 6 grandkids.       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, [employer] on the side   Social Drivers of Health  Tobacco Use: High Risk (06/01/2024)   Patient History    Smoking Tobacco Use: Some Days    Smokeless Tobacco Use: Never    Passive Exposure: Not on file  Financial Resource Strain: Low Risk (04/27/2023)   Overall Financial Resource Strain (CARDIA)    Difficulty of Paying Living Expenses: Not hard at all  Food Insecurity: No Food Insecurity (05/03/2024)   Epic    Worried About Programme Researcher, Broadcasting/film/video in the Last Year: Never true    Ran Out of Food in the Last Year: Never true  Transportation Needs: No Transportation Needs (05/03/2024)   Epic    Lack of Transportation (Medical): No    Lack of Transportation (Non-Medical): No  Physical Activity: Inactive (05/03/2024)   Exercise Vital Sign    Days of Exercise per Week: 0 days    Minutes of Exercise per Session: 0 min  Stress: No Stress Concern Present (05/03/2024)   [employer] of Occupational Health - Occupational Stress Questionnaire    Feeling of Stress: Not at all  Social Connections: Moderately Isolated (05/03/2024)   Social Connection and Isolation Panel    Frequency of Communication with Friends and Family: Three times a week    Frequency of Social Gatherings with Friends and Family: More than three times a week    Attends Religious Services: Never    Database Administrator or Organizations: No    Attends Banker Meetings: Never    Marital Status: Married  Catering Manager Violence: Not At Risk  (05/03/2024)   Epic    Fear of Current or Ex-Partner: No    Emotionally Abused: No    Physically Abused: No    Sexually Abused: No  Depression (PHQ2-9): Low Risk (05/03/2024)   Depression (PHQ2-9)    PHQ-2 Score: 0  Alcohol Screen: Not on file  Housing: Unknown (05/03/2024)   Epic    Unable to Pay for Housing in the Last Year: No    Number of Times Moved in the Last Year: Not on file    Homeless in the Last Year: No  Utilities: Not At Risk (05/03/2024)   Epic    Threatened with loss of utilities: No  Health Literacy: Adequate Health Literacy (05/03/2024)   B1300 Health Literacy    Frequency of need for help with medical instructions: Never    Review of Systems:  All other review of systems negative except as mentioned in the HPI.  Physical Exam: Vital signs in last 24 hours: BP 110/66   Pulse 96   Temp 97.9 F (36.6 C)   Ht 5' 9 (1.753 m)   Wt 195 lb (88.5 kg)   SpO2 98%   BMI 28.80 kg/m  General:   Alert, NAD Lungs:  Clear .   Heart:  Regular rate and rhythm Abdomen:  Soft, nontender and nondistended. Neuro/Psych:  Alert and cooperative. Normal mood and affect. A and O x 3  Reviewed labs, radiology imaging, old records and pertinent past GI work up  Patient is appropriate for planned procedure(s) and anesthesia in an ambulatory setting   K. Veena Zyan Mirkin , MD [phone]

## 2024-06-01 NOTE — Progress Notes (Signed)
 Report to PACU, RN, vss, BBS= Clear.

## 2024-06-01 NOTE — Op Note (Signed)
 [facility] Patient Name: Jonathon [name] Procedure Date: 06/01/2024 10:38 AM MRN: [id number] Endoscopist: Gustav ALONSO Mcgee , MD, [id number] Age: 83 Referring MD:  Date of Birth: [birth year] Gender: Male Account #: [id number] Procedure:                Colonoscopy Indications:              Positive Cologuard test Medicines:                Monitored Anesthesia Care Procedure:                Pre-Anesthesia Assessment:                           - Prior to the procedure, a History and Physical                            was performed, and patient medications and                            allergies were reviewed. The patient's tolerance of                            previous anesthesia was also reviewed. The risks                            and benefits of the procedure and the sedation                            options and risks were discussed with the patient.                            All questions were answered, and informed consent                            was obtained. Prior Anticoagulants: The patient has                            taken no anticoagulant or antiplatelet agents. ASA                            Grade Assessment: III - A patient with severe                            systemic disease. After reviewing the risks and                            benefits, the patient was deemed in satisfactory                            condition to undergo the procedure.                           After obtaining informed consent, the colonoscope  was passed under direct vision. Throughout the                            procedure, the patient's blood pressure, pulse, and                            oxygen saturations were monitored continuously. The                            PCF-HQ190L Colonoscope [id number] was introduced                            through the anus and advanced to the the cecum,                            identified by appendiceal  orifice and ileocecal                            valve. The colonoscopy was performed without                            difficulty. The patient tolerated the procedure                            well. The quality of the bowel preparation was                            good. The ileocecal valve, appendiceal orifice, and                            rectum were photographed. Scope In: 10:55:51 AM Scope Out: 11:33:09 AM Scope Withdrawal Time: 0 hours 12 minutes 9 seconds  Total Procedure Duration: 0 hours 37 minutes 18 seconds  Findings:                 The perianal and digital rectal examinations were                            normal.                           Four sessile polyps were found in the transverse                            colon and ascending colon. The polyps were 4 to 8                            mm in size. These polyps were removed with a cold                            snare. Resection and retrieval were complete.                           A 15 mm polyp was found in the descending colon.  The polyp was semi-pedunculated. The polyp was                            removed with a hot snare. Resection and retrieval                            were complete.                           Non-bleeding external and internal hemorrhoids were                            found during retroflexion. The hemorrhoids were                            medium-sized. Complications:            No immediate complications. Estimated Blood Loss:     Estimated blood loss was minimal. Impression:               - Four 4 to 8 mm polyps in the transverse colon and                            in the ascending colon, removed with a cold snare.                            Resected and retrieved.                           - One 15 mm polyp in the descending colon, removed                            with a hot snare. Resected and retrieved.                           - Non-bleeding  external and internal hemorrhoids. Recommendation:           - Resume previous diet.                           - Continue present medications.                           - Await pathology results.                           - No repeat colonoscopy due to age. Edu On V. Jonathon Bahner, MD 06/01/2024 11:42:48 AM This report has been signed electronically.

## 2024-06-01 NOTE — Progress Notes (Signed)
 Called to room to assist during endoscopic procedure.  Patient ID and intended procedure confirmed with present staff. Received instructions for my participation in the procedure from the performing physician.

## 2024-06-01 NOTE — Patient Instructions (Signed)

## 2024-06-02 ENCOUNTER — Ambulatory Visit: Payer: Self-pay | Admitting: Family Medicine

## 2024-06-02 ENCOUNTER — Telehealth: Payer: Self-pay

## 2024-06-02 ENCOUNTER — Encounter: Payer: Self-pay | Admitting: Family Medicine

## 2024-06-02 ENCOUNTER — Ambulatory Visit: Admitting: Family Medicine

## 2024-06-02 VITALS — BP 100/60 | HR 83 | Temp 98.2°F | Ht 69.0 in | Wt 188.4 lb

## 2024-06-02 DIAGNOSIS — E663 Overweight: Secondary | ICD-10-CM | POA: Diagnosis not present

## 2024-06-02 DIAGNOSIS — E785 Hyperlipidemia, unspecified: Secondary | ICD-10-CM

## 2024-06-02 DIAGNOSIS — F17210 Nicotine dependence, cigarettes, uncomplicated: Secondary | ICD-10-CM

## 2024-06-02 DIAGNOSIS — Z0000 Encounter for general adult medical examination without abnormal findings: Secondary | ICD-10-CM | POA: Diagnosis not present

## 2024-06-02 DIAGNOSIS — Z79899 Other long term (current) drug therapy: Secondary | ICD-10-CM | POA: Diagnosis not present

## 2024-06-02 DIAGNOSIS — Z131 Encounter for screening for diabetes mellitus: Secondary | ICD-10-CM

## 2024-06-02 LAB — CBC WITH DIFFERENTIAL/PLATELET
Basophils Absolute: 0 K/uL (ref 0.0–0.1)
Basophils Relative: 0.7 % (ref 0.0–3.0)
Eosinophils Absolute: 0.3 K/uL (ref 0.0–0.7)
Eosinophils Relative: 6.7 % — ABNORMAL HIGH (ref 0.0–5.0)
HCT: 36.1 % — ABNORMAL LOW (ref 39.0–52.0)
Hemoglobin: 12.2 g/dL — ABNORMAL LOW (ref 13.0–17.0)
Lymphocytes Relative: 25.2 % (ref 12.0–46.0)
Lymphs Abs: 1.2 K/uL (ref 0.7–4.0)
MCHC: 33.7 g/dL (ref 30.0–36.0)
MCV: 94.3 fl (ref 78.0–100.0)
Monocytes Absolute: 0.5 K/uL (ref 0.1–1.0)
Monocytes Relative: 11.6 % (ref 3.0–12.0)
Neutro Abs: 2.6 K/uL (ref 1.4–7.7)
Neutrophils Relative %: 55.8 % (ref 43.0–77.0)
Platelets: 230 K/uL (ref 150.0–400.0)
RBC: 3.83 Mil/uL — ABNORMAL LOW (ref 4.22–5.81)
RDW: 13.6 % (ref 11.5–15.5)
WBC: 4.6 K/uL (ref 4.0–10.5)

## 2024-06-02 LAB — COMPREHENSIVE METABOLIC PANEL WITH GFR
ALT: 15 U/L (ref 3–53)
AST: 19 U/L (ref 5–37)
Albumin: 3.9 g/dL (ref 3.5–5.2)
Alkaline Phosphatase: 80 U/L (ref 39–117)
BUN: 11 mg/dL (ref 6–23)
CO2: 27 meq/L (ref 19–32)
Calcium: 9.4 mg/dL (ref 8.4–10.5)
Chloride: 104 meq/L (ref 96–112)
Creatinine, Ser: 0.99 mg/dL (ref 0.40–1.50)
GFR: 70.4 mL/min (ref >60.00)
Glucose, Bld: 106 mg/dL — ABNORMAL HIGH (ref 70–99)
Potassium: 3.3 meq/L — ABNORMAL LOW (ref 3.5–5.1)
Sodium: 140 meq/L (ref 135–145)
Total Bilirubin: 0.8 mg/dL (ref 0.2–1.2)
Total Protein: 7 g/dL (ref 6.0–8.3)

## 2024-06-02 LAB — URINALYSIS, ROUTINE W REFLEX MICROSCOPIC
Bilirubin Urine: NEGATIVE
Hgb urine dipstick: NEGATIVE
Ketones, ur: NEGATIVE
Leukocytes,Ua: NEGATIVE
Nitrite: NEGATIVE
RBC / HPF: NONE SEEN (ref 0–?)
Specific Gravity, Urine: 1.025 (ref 1.000–1.030)
Urine Glucose: NEGATIVE
Urobilinogen, UA: 0.2 (ref 0.0–1.0)
pH: 6 (ref 5.0–8.0)

## 2024-06-02 LAB — HEMOGLOBIN A1C: Hgb A1c MFr Bld: 5.8 % (ref 4.6–6.5)

## 2024-06-02 LAB — LIPID PANEL
Cholesterol: 115 mg/dL (ref 28–200)
HDL: 35.5 mg/dL — ABNORMAL LOW (ref >39.00)
LDL Cholesterol: 61 mg/dL (ref 10–99)
NonHDL: 79.81
Total CHOL/HDL Ratio: 3
Triglycerides: 93 mg/dL (ref 10.0–149.0)
VLDL: 18.6 mg/dL (ref 0.0–40.0)

## 2024-06-02 MED ORDER — HYDROCODONE-ACETAMINOPHEN 5-325 MG PO TABS: 1.0000 | ORAL_TABLET | Freq: Three times a day (TID) | ORAL | 0 refills | Status: AC | PRN

## 2024-06-02 NOTE — Telephone Encounter (Signed)
 Attempted to reach patient for post-procedure f/u call. No answer. Left message for him to please not hesitate to call if he has questions/concerns regarding his care.

## 2024-06-02 NOTE — Progress Notes (Signed)
 Phone: [phone]   Subjective:  Patient presents today for their annual physical. Chief complaint-noted.   See problem oriented charting- ROS- full  review of systems was completed and negative  except for topics noted under acute/chronic concerns  The following were reviewed and entered/updated in epic: Past Medical History:  Diagnosis Date   Anxiety    never tried on SSRI. xanax  0.5mg  BID through Dr. Linnie.    DDD (degenerative disc disease), lumbar    lumbar DDD- follows with orthopedist Dr. Lucilla schultz ortho. mobic  15mg  daily and hydrocodone  5/325 prn   Family history of breast cancer    HLD (hyperlipidemia)    Macular degeneration 07/21/2016   Follows with optho- areds2. He states he is unsure of diagnosis.    Neuropathy    unknown cause ? back related(sees ortho for back). burning sensation in feet. gabapentin  100mg  after dinner, 2 before bed   Sleep apnea treated with continuous positive airway pressure (CPAP)    Smoker    8-10 cigarettes a day. plus nicorette. 60 years. over 50 pack years   Venous stasis     in the past- Wound center on legs. Elevate. Compression stockings.    Patient Active Problem List   Diagnosis Date Noted   DDD (degenerative disc disease), lumbar     Priority: High   GAD (generalized anxiety disorder)     Priority: High   Cigarette smoker     Priority: High   Emphysema lung (HCC) 12/03/2020    Priority: Medium    OSA on CPAP 12/03/2017    Priority: Medium    Hyperlipidemia 01/28/2017    Priority: Medium    Pulmonary nodules 12/26/2016    Priority: Medium    Aortic atherosclerosis 10/28/2016    Priority: Medium    Neuropathy     Priority: Medium    Special screening for malignant neoplasms, colon 10/14/2016    Priority: Low   Family history of colon cancer 10/02/2016    Priority: Low   Family history of breast cancer     Priority: Low   Hilar lymphadenopathy 09/23/2016    Priority: Low   Lymph nodes enlarged 09/23/2016     Priority: Low   Family history of genetic disease 08/28/2016    Priority: Low   Macular degeneration 07/21/2016    Priority: Low   Lumbar radiculopathy, chronic 09/13/2020    Priority: 1.   Degenerative lumbar spinal stenosis 04/06/2019    Priority: 1.   Vitamin B 12 deficiency 03/02/2024   Leukopenia 12/28/2023   Normocytic anemia 12/28/2023   Positive FIT (fecal immunochemical test) 12/28/2023   PALB2 positive 05/29/2023   Jock itch 07/31/2017   Past Surgical History:  Procedure Laterality Date   ESOPHAGOGASTRODUODENOSCOPY Left 05/28/2016    Surgeon: Renaye Sous, MD; food stuck in esophagus   LUMBAR LAMINECTOMY/DECOMPRESSION MICRODISCECTOMY Left 09/13/2020   Procedure: Left Lumbar four-five Laminectomy for facet/synovial cyst;  Surgeon: Colon Shove, MD;  Location: [facility] OR;  Service: Neurosurgery;  Laterality: Left;    Family History  Problem Relation Age of Onset   Other Mother        old age per patient    Dementia Mother    Alcohol abuse Father        contributed   Cancer Sister        unknown type   Breast cancer Sister        dx in her early 33s   Other Sister  PALB2+   Alcohol abuse Brother    Breast cancer Maternal Aunt    Colon cancer Maternal Uncle    Breast cancer Other 39   Esophageal cancer Neg Hx    Rectal cancer Neg Hx    Stomach cancer Neg Hx     Medications- reviewed and updated Current Outpatient Medications  Medication Sig Dispense Refill   atorvastatin  (LIPITOR) 20 MG tablet TAKE 1 TABLET BY MOUTH EVERY DAY 90 tablet 3   cholecalciferol (VITAMIN D3) 25 MCG (1000 UNIT) tablet Take 1,000 Units by mouth daily.     cyanocobalamin  (VITAMIN B12) 1000 MCG tablet Take 1 tablet (1,000 mcg total) by mouth daily. 90 tablet 3   escitalopram  (LEXAPRO ) 5 MG tablet TAKE 1 TABLET (5 MG TOTAL) BY MOUTH DAILY. 90 tablet 3   gabapentin  (NEURONTIN ) 400 MG capsule TAKE 1 CAPSULE MIDDAY AND 2 CAPSULES AT BEDTIME. 270 capsule 5   ketoconazole  (NIZORAL ) 2 %  cream Apply 1 Application topically daily. For jock itch 60 g 2   Multiple Vitamins-Minerals (PRESERVISION AREDS 2 PO) Take 2 tablets by mouth daily.     OVER THE COUNTER MEDICATION Take 4 tablets by mouth daily.     pantoprazole  (PROTONIX ) 40 MG tablet Take 1 tablet (40 mg total) by mouth daily. 90 tablet 3   polyethylene glycol powder (GLYCOLAX /MIRALAX ) 17 GM/SCOOP powder Take 17 g by mouth daily as needed (Constipation).     HYDROcodone -acetaminophen  (NORCO) 5-325 MG tablet Take 1 tablet by mouth every 8 (eight) hours as needed for severe pain (pain score 7-10) (for chronic osteoarthritis of lumbar spine. do not drive for 8 hours after taking). 60 tablet 0   No current facility-administered medications for this visit.    Allergies-reviewed and updated Allergies[1]  Social History   Social History Narrative   Married around 1964. 3 children. 6 grandkids.       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 Oropharynx normal Neck: no thyromegaly CV: RRR no murmurs rubs or gallops Lungs: CTAB no crackles, wheeze, rhonchi Abdomen: soft/nontender/nondistended/normal bowel sounds. No rebound or guarding.  Ext: no edema Skin: warm, dry Neuro: grossly normal, moves all extremities, PERRLA    Assessment and Plan  83 y.o. Jonathon [name] presenting for annual physical.  Health Maintenance counseling: 1. Anticipatory guidance: Patient counseled regarding regular dental exams -q6 months, eye exams -yearly,  avoiding smoking and second hand smoke- see below , limiting alcohol to 2 beverages per day -none, no illicit drugs .   2.  Risk factor reduction:  Advised patient of need for regular exercise and diet rich and fruits and vegetables to reduce risk of heart attack and stroke.  Exercise- not exercising.  Diet/weight management-down 9 lbs in last year - some from colonoscopy- continue to monitor .  Wt Readings from Last 3 Encounters:  06/02/24 188 lb 6.4 oz (85.5 kg)  06/01/24 195 lb (88.5 kg)  05/03/24 191 lb 6.4 oz (86.8 kg)   3. Immunizations/screenings/ancillary studies- holding off on COVID shot Immunization History  Administered Date(s) Administered   Fluad Quad(high Dose  65+) 02/10/2019, 03/01/2020, 03/20/2021, 04/21/2022   Fluad Trivalent(High Dose 65+) 04/27/2023   INFLUENZA, HIGH DOSE SEASONAL PF 05/18/2015, 03/18/2016, 02/14/2017, 03/26/2017, 04/16/2018, 06/04/2018, 05/03/2024   Influenza Split 03/16/2014, 03/17/2015, 02/15/2016   Influenza-Unspecified 04/16/2012, 05/18/2015, 03/18/2016, 04/30/2016, 03/17/2019, 03/16/2020, 03/16/2021, 06/18/2022   Moderna Sars-Covid-2 Vaccination 07/07/2019, 08/04/2019, 07/04/2020, 12/14/2020   Pneumococcal Conjugate-13 01/31/2014   Pneumococcal Polysaccharide-23 12/03/2017   Pneumococcal-Unspecified 03/17/2011   Tdap 03/21/2019   Zoster Recombinant(Shingrix) 08/10/2020, 11/19/2020   Zoster, Live 07/06/2013  4. Prostate cancer screening- past age based screening recommendations    5. Colon cancer screening - updated colonoscopy 06/01/2024 with several polyps but likely no repeat due to age 61 Dr. Madalynn pathology 6. Skin cancer screening- no dermatology. advised regular sunscreen use. Denies worrisome, changing, or new skin lesions.  7. Smoking associated screening (lung cancer screening, AAA screen 65-75, UA)- current smoker- 8-10 per day. Encouraged cessation 8. STD screening - only active with wife  Status of chronic or acute concerns   # GAD S: compliant with lexapro  5 mg, gabapentin  for neuropathy also likely beneficial.  -Previously had dry mouth  on zoloft  150mg  and cut to 50mg  and still had issues.  -prior  alprazolam  #37 per month as well but stopped with hydrocodone     06/02/2024    8:Jonathon AM 01/07/2023   11:19 AM 10/30/2022   11:02 AM 04/29/2019    8:03 AM  GAD 7 : Generalized Anxiety Score  Nervous, Anxious, on Edge 0 0 0 0  Control/stop worrying 0 0 0 0  Worry too much - different things 0 0 0 0  Trouble relaxing 0 0 0 0  Restless 0 0 0 0  Easily annoyed or irritable 0 0 0 0  Afraid - awful might happen 0 0 0 0  Total GAD 7 Score 0 0 0 0  Anxiety Difficulty Not difficult at all  Not difficult at all    A/P: imperfect but reasonable control- continue current medications    # Emphysema # Smoking S:smoking 8-10 cigs per day in past getting down to under 5 per day. Over 50 pack years.   - lung cancer screening program through 87 -Denies shortness of breath  -Did have emphysema flare 05/21/2023  A/P:  thankfully stable lately with no recent flares   # Hyperlipidemia/aortic atherosclerosis #CAD on CT scan S:compliant with atorvastatin  20 mg daily With smoking history we have opted to continue aspirin as well. Alternatively Lexapro  increases bleeding risk- if any future bleeding issues would d/c aspirin Aortic atherosclerosis on prior imaging as well- reasonable for statin Lab Results  Component Value Date   CHOL 99 05/11/2023   HDL 31.20 (L) 05/11/2023   LDLCALC 50 05/11/2023   LDLDIRECT 49 04/05/2020   TRIG 93.0 05/11/2023   CHOLHDL 3 05/11/2023  A/P: reasonable control last year- update today- continue current medications    # Neuropathy  S:continued issues with left foot related to back. Uses gabapentin  400 at lunch and 800 before bed- finds this helpful  A/P:  reasonable control- continue current medications    # B12 deficiency leading to anemia and leukopenia it appears-has seen Dr. Autumn with hematology S: Current treatment/medication (oral vs. IM):   1000 mcg daily       Lab Results  Component Value Date    VITAMINB12 858 03/22/2024  A/P: hopefully stable- update b12 today. Continue current meds for now    #-Degenerative disc disease lumbar spine S:Medication: Hydrocodone /acetaminophen  5-3 25 up to 4 times a day with  flares Even with medication continues to deal with significant pain but this at least helps him tolerate it- highly variable on when it flares with back- also his neck likely has osteoarthritis and canc ause issues  A/P: degenerative disk disease probably lumbar and cervical- refill medication but space as much as possible  -Controlled substance contract signed 01/07/2023  -PDMP reviewed and low risk -UDS ordered  November 2024 and due for repeat today  #constipation- Plain colace/docusate 100 mg can be taken twice a day- your pill is a combo pill with senna and it may stimulate the gut too much in long run. Miralax  has failed him  #OSA- uses cpap regularly    # GERD/Barrett's esophagus April 27, 2024 with repeat in 3 years-he remains on pantoprazole   Recommended follow up: Return in about 6 months (around 12/01/2024) for physical or sooner if needed.Schedule b4 you leave. Future Appointments  Date Time Provider Department Center  07/19/2024  8:00 AM [facility] PHLEBOTOMIST [facility] None  07/19/2024  8:30 AM Pasam, Chinita, MD [facility] None  05/09/2025  9:20 AM [facility] ANNUAL WELLNESS VISIT 1 [facility] [place]   Lab/Order associations:NOT fasting   ICD-10-CM   1. Preventative health care  Z00.00     2. Hyperlipidemia, unspecified hyperlipidemia type  E78.5 CBC with Differential/Platelet    Lipid panel    Comprehensive metabolic panel    3. Cigarette smoker  F17.210 Urinalysis, Routine w reflex microscopic    4. Screening for diabetes mellitus  Z13.1 Hemoglobin A1c    5. Overweight  E66.3 Hemoglobin A1c    6. High risk medication use  Z79.899 DRUG MONITORING, PANEL 8 WITH CONFIRMATION, URINE      Meds ordered this encounter  Medications    HYDROcodone -acetaminophen  (NORCO) 5-325 MG tablet    Sig: Take 1 tablet by mouth every 8 (eight) hours as needed for severe pain (pain score 7-10) (for chronic osteoarthritis of lumbar spine. do not drive for 8 hours after taking).    Dispense:  60 tablet    Refill:  0    Return precautions advised.  Garnette Lukes, MD      [1]  Allergies Allergen Reactions   Penicillins Rash    Has patient had a PCN reaction causing immediate rash, facial/tongue/throat swelling, SOB or lightheadedness with hypotension: no Has patient had a PCN reaction causing severe rash involving mucus membranes or skin necrosis:yes Has patient had a PCN reaction that required hospitalization: no Has patient had a PCN reaction occurring within the last 10 years: no If all of the above answers are NO, then may proceed with Cephalosporin use.

## 2024-06-02 NOTE — Patient Instructions (Addendum)
 Plain colace/docusate 100 mg can be taken twice a day- your pill is a combo pill with senna and it may stimulate the gut too much in long run.   As always would encourage him to quit smoking  Please stop by lab before you go If you have mychart- we will send your results within 3 business days of us  receiving them.  If you do not have mychart- we will call you about results within 5 business days of us  receiving them.  *please also note that you will see labs on mychart as soon as they post. I will later go in and write notes on them- will say notes from Dr. Katrinka   Recommended follow up: Return in about 6 months (around 12/01/2024) for physical or sooner if needed.Schedule b4 you leave.

## 2024-06-03 LAB — SURGICAL PATHOLOGY

## 2024-06-05 LAB — DRUG MONITORING, PANEL 8 WITH CONFIRMATION, URINE
6 Acetylmorphine: NEGATIVE ng/mL
Alcohol Metabolites: NEGATIVE ng/mL
Amphetamines: NEGATIVE ng/mL
Benzodiazepines: NEGATIVE ng/mL
Buprenorphine, Urine: NEGATIVE ng/mL
Cocaine Metabolite: NEGATIVE ng/mL
Codeine: NEGATIVE ng/mL
Creatinine: 187 mg/dL
Hydrocodone: 121 ng/mL — ABNORMAL HIGH
Hydromorphone: 97 ng/mL — ABNORMAL HIGH
MDMA: NEGATIVE ng/mL
Marijuana Metabolite: NEGATIVE ng/mL
Morphine: NEGATIVE ng/mL
Norhydrocodone: 249 ng/mL — ABNORMAL HIGH
Opiates: POSITIVE ng/mL — AB
Oxidant: NEGATIVE ug/mL
Oxycodone: NEGATIVE ng/mL
pH: 5.6 (ref 4.5–9.0)

## 2024-06-05 LAB — DM TEMPLATE

## 2024-07-14 ENCOUNTER — Ambulatory Visit: Payer: Self-pay | Admitting: Gastroenterology

## 2024-07-18 ENCOUNTER — Telehealth: Payer: Self-pay

## 2024-07-18 NOTE — Telephone Encounter (Signed)
 Tuesday 07/19/24 appointments canceled. Patient is aware and scheduling will call to reschedule.

## 2024-07-19 ENCOUNTER — Ambulatory Visit: Admitting: Oncology

## 2024-07-19 ENCOUNTER — Other Ambulatory Visit

## 2024-08-16 ENCOUNTER — Inpatient Hospital Stay: Admitting: Oncology

## 2024-08-16 ENCOUNTER — Inpatient Hospital Stay

## 2025-05-09 ENCOUNTER — Ambulatory Visit
